# Patient Record
Sex: Female | Born: 1935 | Race: White | Hispanic: No | Smoking: Never smoker
Health system: Southern US, Community
[De-identification: ages and names within clinical notes are randomized; demographics above are authoritative.]

## PROBLEM LIST (undated history)

## (undated) DIAGNOSIS — M79606 Pain in leg, unspecified: Secondary | ICD-10-CM

## (undated) DIAGNOSIS — I2510 Atherosclerotic heart disease of native coronary artery without angina pectoris: Secondary | ICD-10-CM

## (undated) DIAGNOSIS — D649 Anemia, unspecified: Secondary | ICD-10-CM

## (undated) DIAGNOSIS — R002 Palpitations: Secondary | ICD-10-CM

## (undated) DIAGNOSIS — I10 Essential (primary) hypertension: Secondary | ICD-10-CM

## (undated) DIAGNOSIS — I739 Peripheral vascular disease, unspecified: Secondary | ICD-10-CM

## (undated) DIAGNOSIS — R0789 Other chest pain: Secondary | ICD-10-CM

## (undated) DIAGNOSIS — I779 Disorder of arteries and arterioles, unspecified: Secondary | ICD-10-CM

## (undated) HISTORY — DX: Peripheral vascular disease, unspecified: I73.9

## (undated) HISTORY — DX: Pain in leg, unspecified: M79.606

## (undated) HISTORY — DX: Disorder of arteries and arterioles, unspecified: I77.9

## (undated) HISTORY — DX: Palpitations: R00.2

## (undated) HISTORY — DX: Anemia, unspecified: D64.9

## (undated) HISTORY — DX: Other chest pain: R07.89

## (undated) HISTORY — PX: CAROTID ENDARTERECTOMY: SUR193

## (undated) HISTORY — DX: Essential (primary) hypertension: I10

---

## 1998-10-30 ENCOUNTER — Other Ambulatory Visit: Admission: RE | Admit: 1998-10-30 | Discharge: 1998-10-30 | Payer: Self-pay | Admitting: Gynecology

## 1999-11-04 ENCOUNTER — Other Ambulatory Visit: Admission: RE | Admit: 1999-11-04 | Discharge: 1999-11-04 | Payer: Self-pay | Admitting: Gynecology

## 2000-11-15 ENCOUNTER — Other Ambulatory Visit: Admission: RE | Admit: 2000-11-15 | Discharge: 2000-11-15 | Payer: Self-pay | Admitting: Gynecology

## 2001-11-16 ENCOUNTER — Other Ambulatory Visit: Admission: RE | Admit: 2001-11-16 | Discharge: 2001-11-16 | Payer: Self-pay | Admitting: Gynecology

## 2003-11-21 ENCOUNTER — Other Ambulatory Visit: Admission: RE | Admit: 2003-11-21 | Discharge: 2003-11-21 | Payer: Self-pay | Admitting: Gynecology

## 2004-06-16 ENCOUNTER — Ambulatory Visit (HOSPITAL_COMMUNITY): Admission: RE | Admit: 2004-06-16 | Discharge: 2004-06-16 | Payer: Self-pay | Admitting: Cardiology

## 2004-07-15 HISTORY — PX: CARDIAC CATHETERIZATION: SHX172

## 2004-12-30 ENCOUNTER — Ambulatory Visit: Payer: Self-pay | Admitting: Gastroenterology

## 2005-01-02 ENCOUNTER — Ambulatory Visit: Payer: Self-pay | Admitting: Gastroenterology

## 2005-01-02 ENCOUNTER — Encounter (INDEPENDENT_AMBULATORY_CARE_PROVIDER_SITE_OTHER): Payer: Self-pay | Admitting: Specialist

## 2005-01-27 ENCOUNTER — Ambulatory Visit: Payer: Self-pay | Admitting: Gastroenterology

## 2005-12-10 ENCOUNTER — Other Ambulatory Visit: Admission: RE | Admit: 2005-12-10 | Discharge: 2005-12-10 | Payer: Self-pay | Admitting: Gynecology

## 2007-05-23 ENCOUNTER — Ambulatory Visit: Payer: Self-pay | Admitting: Surgery

## 2007-12-07 ENCOUNTER — Ambulatory Visit: Payer: Self-pay | Admitting: Vascular Surgery

## 2008-04-05 ENCOUNTER — Encounter: Admission: RE | Admit: 2008-04-05 | Discharge: 2008-04-05 | Payer: Self-pay | Admitting: Orthopedic Surgery

## 2008-05-01 ENCOUNTER — Ambulatory Visit: Payer: Self-pay | Admitting: Vascular Surgery

## 2008-05-09 ENCOUNTER — Encounter: Admission: RE | Admit: 2008-05-09 | Discharge: 2008-05-09 | Payer: Self-pay | Admitting: Internal Medicine

## 2008-08-10 ENCOUNTER — Encounter: Admission: RE | Admit: 2008-08-10 | Discharge: 2008-08-10 | Payer: Self-pay | Admitting: Internal Medicine

## 2008-10-26 ENCOUNTER — Ambulatory Visit: Payer: Self-pay | Admitting: Vascular Surgery

## 2009-04-26 ENCOUNTER — Ambulatory Visit: Payer: Self-pay | Admitting: Vascular Surgery

## 2009-05-21 ENCOUNTER — Encounter: Admission: RE | Admit: 2009-05-21 | Discharge: 2009-05-21 | Payer: Self-pay | Admitting: Internal Medicine

## 2009-07-20 HISTORY — PX: EYE SURGERY: SHX253

## 2009-11-14 ENCOUNTER — Ambulatory Visit: Payer: Self-pay | Admitting: Vascular Surgery

## 2010-12-02 NOTE — Procedures (Signed)
CAROTID DUPLEX EXAM   INDICATION:  Follow up carotid artery disease.   HISTORY:  Diabetes:  No.  Cardiac:  CAD.  Hypertension:  Yes.  Smoking:  No.  Previous Surgery:  No.  CV History:  Amaurosis Fugax No, Paresthesias No, Hemiparesis No.                                       RIGHT             LEFT  Brachial systolic pressure:         132               136  Brachial Doppler waveforms:         Normal            Normal  Vertebral direction of flow:        Antegrade/resistive                 Antegrade  DUPLEX VELOCITIES (cm/sec)  CCA peak systolic                   100               78  ECA peak systolic                   99                342  ICA peak systolic                   306               221  ICA end diastolic                   60                60  PLAQUE MORPHOLOGY:                  Calcific          Calcific  PLAQUE AMOUNT:                      Moderate/severe   Moderate/severe  PLAQUE LOCATION:                    ICA               ICA/ECA   IMPRESSION:  1. High-end 60-79% stenosis of the right internal carotid artery.  2. 60-79% stenosis of the left internal carotid artery.  3. Mildly resistive Doppler waveform of the right vertebral artery      noted.  4. Progression of disease noted in the left internal carotid artery      with no change in the right internal carotid artery when compared      to the previous examination on 12/07/2007.   ___________________________________________  Larina Earthly, M.D.   CH/MEDQ  D:  05/01/2008  T:  05/01/2008  Job:  [id number]

## 2010-12-02 NOTE — Procedures (Signed)
CAROTID DUPLEX EXAM   INDICATION:  Follow-up evaluation of known carotid artery disease.   HISTORY:  Diabetes:  No.  Cardiac:  Coronary artery disease.  Hypertension:  Yes.  Smoking:  No.  Previous Surgery:  No.  CV History:  Patient reports no cerebrovascular symptoms at this time.  Previous duplex performed 05/23/07 revealed 60-79% right ICA stenosis  and a 60-79% left ICA stenosis.  Amaurosis Fugax No, Paresthesias No, Hemiparesis No                                       RIGHT             LEFT  Brachial systolic pressure:         130               136  Brachial Doppler waveforms:         Triphasic         Triphasic  Vertebral direction of flow:        Atypical antegrade                  Antegrade  DUPLEX VELOCITIES (cm/sec)  CCA peak systolic                   70                70  ECA peak systolic                   88                239  ICA peak systolic                   273               173  ICA end diastolic                   68                53  PLAQUE MORPHOLOGY:                  Calcified         Calcified  PLAQUE AMOUNT:                      Moderate-to-severe                  Moderate-to-severe  PLAQUE LOCATION:                    Mid-to-distal ICA Mid-to-distal ICA   IMPRESSION:  1. 60-79% right internal carotid artery stenosis.  2. 40-59% left internal carotid artery stenosis (high end of range).  3. Right vertebral artery waveforms suggest distal obstruction or      occlusion.   ___________________________________________  Larina Earthly, M.D.   MC/MEDQ  D:  12/07/2007  T:  12/07/2007  Job:  [id number]

## 2010-12-02 NOTE — Assessment & Plan Note (Signed)
OFFICE VISIT   MURPHY, BUNDICK  DOB:  March 23, 1927                                       11/14/2009  ZOXWR#:[id number]   The patient is a very pleasant 77 year old woman who we follow for  carotid occlusive disease.  She states that she has been currently  asymptomatic.  She denies amaurosis fugax or lateralizing paresthesias  or TIA like symptoms.   PAST MEDICAL HISTORY:  Consists of high blood pressure and dyslipidemia.   ALLERGIES:  None.   CURRENT MEDICATION:  1. Amlodipine 5 mg p.o. daily.  2. Isosorbide mononitrate 60 mg p.o. daily.  3. Metoprolol tartrate 100 mg p.o. b.i.d.  4. Ropinirole.  5. Hydrochlorothiazide 1 mg p.o. b.i.d.  6. Naprosyn over-the-counter.  7. Crestor 10 mg p.o. daily.  8. Vitamin D 2000 mg p.o. daily.  9. Aspirin 81 mg p.o. daily.   FAMILY HISTORY:  Significant for heart and vascular disease at a young  age in her father and brothers.   SOCIAL HISTORY:  She is widowed with two children.  She is retired.  She  does not currently smoke and has never smoked cigarettes or used  alcohol.   PHYSICAL FINDINGS:  Revealed a very pleasant elderly woman in no  apparent distress.  She was fairly well mobile.  Her heart rate was 62,  blood pressure 147/69 in her right arm, in her left arm it was 138/71.  She was well-nourished and in no distress.  HEENT:  NCAT.  PERRLA.  EOMI  intact.  Conjunctivae normal.  Mucous membranes were pink and moist.  The trachea was midline.  Lungs were clear to auscultation.  Cardiac  exam revealed a regular rate and rhythm.  I did appreciate a left  carotid bruit.  The abdomen was soft, nontender, nondistended.  Musculoskeletal system demonstrated no major deformities.  Neurological  exam was nonfocal.  I did not appreciate any rashes.   Laboratory work.  She did undergo duplex of her bilateral carotid  arteries today.  She did have bilateral internal carotid arteries  suggesting 60%-79% stenosis  on the high end of range.  There was a left  external carotid artery stenosis.  Antegrade flows were bilateral in the  vertebral arteries.  This is not significantly changed from 6 months  ago.   ASSESSMENT AND PLAN:  The patient continues to do well with her carotid  occlusive disease.  At this time, she is not having any symptoms which  would further incline [place] to suggest surgery.  We will continue to follow  her on a 6 months' basis.  Her hypertension and dyslipidemia remain  stable.   Wilmon Arms, PA   Janetta Hora. Fields, MD  Electronically Signed   KEL/MEDQ  D:  11/14/2009  T:  11/14/2009  Job:  [id number]

## 2010-12-02 NOTE — Procedures (Signed)
CAROTID DUPLEX EXAM   INDICATION:  Follow up carotid artery disease.   HISTORY:  Diabetes:  No.  Cardiac:  CAD.  Hypertension:  Yes.  Smoking:  No.  Previous Surgery:  No.  CV History:  No.  Amaurosis Fugax No, Paresthesias No, Hemiparesis No.                                       RIGHT             LEFT  Brachial systolic pressure:         178               154  Brachial Doppler waveforms:         WNL               WNL  Vertebral direction of flow:        Antegrade         Antegrade  DUPLEX VELOCITIES (cm/sec)  CCA peak systolic                   99                83  ECA peak systolic                   144               336  ICA peak systolic                   333               379  ICA end diastolic                   91                94  PLAQUE MORPHOLOGY:                  Heterogenous      Heterogenous  PLAQUE AMOUNT:                      Moderate-to-severe                  Moderate-to-severe  PLAQUE LOCATION:                    ICA, ECA          ICA, ECA   IMPRESSION:  1. Bilateral internal carotid arteries suggest 60% to 79% stenosis      (high end of range).  2. Left external carotid artery stenosis.  3. Antegrade flow in bilateral vertebrals.   ___________________________________________  Larina Earthly, M.D.   CB/MEDQ  D:  11/14/2009  T:  11/14/2009  Job:  [id number]

## 2010-12-02 NOTE — Procedures (Signed)
CAROTID DUPLEX EXAM   INDICATION:  Follow up known carotid artery disease.   HISTORY:  Diabetes:  No.  Cardiac:  Coronary artery disease.  Hypertension:  Yes.  Smoking:  No.  Previous Surgery:  CV History:  Amaurosis Fugax No, Paresthesias No, Hemiparesis No.                                       RIGHT             LEFT  Brachial systolic pressure:         130               132  Brachial Doppler waveforms:         Biphasic          Biphasic  Vertebral direction of flow:        Antegrade         Antegrade  DUPLEX VELOCITIES (cm/sec)  CCA peak systolic                   67                69  ECA peak systolic                   113               331  ICA peak systolic                   338               292  ICA end diastolic                   84                81  PLAQUE MORPHOLOGY:                  Heterogenous      Heterogenous  PLAQUE AMOUNT:                      Moderate to severe                  Moderate  PLAQUE LOCATION:                    ICA, ECA          ICA, ECA   IMPRESSION:  1. 60-79% stenosis noted in bilateral internal carotid arteries.  2. Antegrade bilateral vertebral arteries.       ___________________________________________  Larina Earthly, M.D.   MG/MEDQ  D:  10/26/2008  T:  10/26/2008  Job:  [id number]

## 2010-12-02 NOTE — Procedures (Signed)
CAROTID DUPLEX EXAM   INDICATION:  Followup carotid artery disease   HISTORY:  Diabetes:  No  Cardiac:  CAD  Hypertension:  Yes  Smoking:  No  Previous Surgery:  No  CV History:  No  Amaurosis Fugax No, Paresthesias No, Hemiparesis No                                       RIGHT             LEFT  Brachial systolic pressure:         130               138  Brachial Doppler waveforms:         Triphasic         Triphasic  Vertebral direction of flow:        Antegrade         Antegrade  DUPLEX VELOCITIES (cm/sec)  CCA peak systolic                   97                76  ECA peak systolic                   109               307  ICA peak systolic                   340               228  ICA end diastolic                   74                60  PLAQUE MORPHOLOGY:                  Calcified         Calcified  PLAQUE AMOUNT:                      Moderate          Moderate  PLAQUE LOCATION:                    ICA               ICA/ECA   IMPRESSION:  1. Evidence of bilateral ICA stenosis of 60% to 79% (the left side on      the lower end of range).  2. Left ECA stenosis.  3. Increased velocities from previous study, however, no category      change.   ___________________________________________  Larina Earthly, M.D.   AS/MEDQ  D:  05/23/2007  T:  05/24/2007  Job:  [id number]

## 2010-12-02 NOTE — Procedures (Signed)
CAROTID DUPLEX EXAM   INDICATION:  Followup known carotid artery disease.   HISTORY:  Diabetes:  No.  Cardiac:  Coronary artery disease.  Hypertension:  Yes.  Smoking:  No.  Previous Surgery:  No.  CV History:  No.  Amaurosis Fugax No, Paresthesias No, Hemiparesis No                                       RIGHT             LEFT  Brachial systolic pressure:         150               148  Brachial Doppler waveforms:         Triphasic         Triphasic  Vertebral direction of flow:        Antegrade         Antegrade  DUPLEX VELOCITIES (cm/sec)  CCA peak systolic                   114               90  ECA peak systolic                   98                410  ICA peak systolic                   378               316  ICA end diastolic                   69                66  PLAQUE MORPHOLOGY:                  Heterogeneous     Heterogeneous  PLAQUE AMOUNT:                      Moderate to severe                  Moderate  PLAQUE LOCATION:                    ICA/ECA           ICA/ECA   IMPRESSION:  1. 60%-79% stenosis in bilateral internal carotid arteries.  2. Antegrade flow in bilateral vertebrals.  3. Stenosis of the left external carotid artery.        ___________________________________________  Larina Earthly, M.D.   CB/MEDQ  D:  04/26/2009  T:  04/27/2009  Job:  [id number]

## 2010-12-29 ENCOUNTER — Ambulatory Visit
Admission: RE | Admit: 2010-12-29 | Discharge: 2010-12-29 | Disposition: A | Discharge status: Home / Self Care | Payer: Medicare Other | Source: Ambulatory Visit | Attending: Cardiovascular Disease | Admitting: Cardiovascular Disease

## 2010-12-29 ENCOUNTER — Other Ambulatory Visit: Payer: Self-pay | Admitting: Cardiovascular Disease

## 2010-12-29 DIAGNOSIS — R918 Other nonspecific abnormal finding of lung field: Secondary | ICD-10-CM

## 2011-01-20 ENCOUNTER — Other Ambulatory Visit: Payer: Self-pay

## 2011-01-20 ENCOUNTER — Ambulatory Visit: Payer: Self-pay | Admitting: Vascular Surgery

## 2011-02-25 ENCOUNTER — Encounter: Payer: Self-pay | Admitting: Vascular Surgery

## 2011-03-03 ENCOUNTER — Encounter: Payer: Self-pay | Admitting: Vascular Surgery

## 2011-03-03 ENCOUNTER — Other Ambulatory Visit (INDEPENDENT_AMBULATORY_CARE_PROVIDER_SITE_OTHER): Payer: Medicare Other

## 2011-03-03 ENCOUNTER — Ambulatory Visit (INDEPENDENT_AMBULATORY_CARE_PROVIDER_SITE_OTHER): Payer: Medicare Other | Admitting: Vascular Surgery

## 2011-03-03 VITALS — BP 175/78 | HR 69 | Temp 98.3°F

## 2011-03-03 DIAGNOSIS — I6529 Occlusion and stenosis of unspecified carotid artery: Secondary | ICD-10-CM

## 2011-03-03 NOTE — Progress Notes (Signed)
Subjective:     Patient ID: [name], female   DOB: 1927/04/07, 77 y.o.   MRN: [id number]  HPI The patient since today for followup of her known asymptomatic cerebrovascular occlusive disease. She denies any symptoms of amaurosis fugax, TIA, or stroke. She remains quite active for her age of 26 and has had no new cardiac difficulties.  Review of Systems  Constitutional: Negative.   HENT: Negative.   Eyes: Negative.   Respiratory: Negative.   Cardiovascular: Positive for leg swelling.  Gastrointestinal: Negative.   Genitourinary: Negative.   Musculoskeletal: Positive for back pain and arthralgias.  Neurological: Negative.   Hematological: Negative.   Psychiatric/Behavioral: Negative.    Past Medical History  Diagnosis Date  . High blood pressure   . Chest tightness   . Palpitations   . Anemia   . Leg pain     with walking  . Carotid arterial disease     asymptomatic     History  Substance Use Topics  . Smoking status: Never Smoker   . Smokeless tobacco: Not on file  . Alcohol Use: No    Family History  Problem Relation Age of Onset  . Lung disease Mother   . Heart disease Father   . Cancer Sister     liver cancer  . Heart disease Brother     No Known Allergies  Current outpatient prescriptions:amLODipine (NORVASC) 5 MG tablet, Take 5 mg by mouth daily.  , Disp: , Rfl: ;  aspirin 81 MG tablet, Take 81 mg by mouth daily.  , Disp: , Rfl: ;  Cholecalciferol (VITAMIN D PO), Take 2,000 mg by mouth daily.  , Disp: , Rfl: ;  isosorbide mononitrate (IMDUR) 60 MG 24 hr tablet, Take 60 mg by mouth daily.  , Disp: , Rfl:  metoprolol (TOPROL-XL) 100 MG 24 hr tablet, Take 100 mg by mouth 2 (two) times daily.  , Disp: , Rfl: ;  NAPROXEN PO, Take by mouth.  , Disp: , Rfl: ;  rOPINIRole (REQUIP) 1 MG tablet, Take 1 mg by mouth 2 (two) times daily.  , Disp: , Rfl: ;  rosuvastatin (CRESTOR) 10 MG tablet, Take 10 mg by mouth daily.  , Disp: , Rfl: ;  triamterene-hydrochlorothiazide  (MAXZIDE-25) 37.5-25 MG per tablet, Take 1 tablet by mouth daily.  , Disp: , Rfl:  ibandronate (BONIVA) 150 MG tablet, Take 150 mg by mouth every 30 (thirty) days. Take in the morning with a full glass of water, on an empty stomach, and do not take anything else by mouth or lie down for the next 30 min. , Disp: , Rfl:   There were no vitals filed for this visit.  There is no height or weight on file to calculate BMI.          Objective:   Physical Exam Well-developed well-nourished white female in no acute distress. No carotid bruits. 2+ radial pulses bilaterally. Heart regular rate and rhythm. Chest clear bilaterally. Neuro grossly intact.  Carotid duplex: Severe 80-99% bilateral internal carotid artery stenosis.    Assessment:     The patient has had significant progression from a duplex criteria of her bilateral carotid stenosis. I discussed the significance of this with the patient and have recommended that we proceed with a CT angiogram for further evaluation. If this confirms bilateral high-grade stenosis we would recommend staged bilateral carotid endarterectomies. I will discuss this further with the patient following her CT scan    Plan:  CT angiogram of carotid arteries an office visit

## 2011-03-04 ENCOUNTER — Other Ambulatory Visit: Payer: Self-pay | Admitting: Vascular Surgery

## 2011-03-04 DIAGNOSIS — I6529 Occlusion and stenosis of unspecified carotid artery: Secondary | ICD-10-CM

## 2011-03-13 NOTE — Procedures (Unsigned)
CAROTID DUPLEX EXAM  INDICATION:  Followup known carotid disease.  HISTORY: Diabetes:  No. Cardiac:  Yes. Hypertension:  Yes. Smoking:  No. Previous Surgery:  No. CV History: Amaurosis Fugax No, Paresthesias No, Hemiparesis No                                      RIGHT             LEFT Brachial systolic pressure:         140               144 Brachial Doppler waveforms:         WNL               WNL Vertebral direction of flow:        Antegrade         Antegrade DUPLEX VELOCITIES (cm/sec) CCA peak systolic                   99                75/0 ECA peak systolic                   93                78 ICA peak systolic                   458               439 ICA end diastolic                   113               161 PLAQUE MORPHOLOGY:                  Heterogeneous     Heterogeneous PLAQUE AMOUNT:                      Severe            Near occlusion PLAQUE LOCATION:                    ICA               ICA  IMPRESSION: 1. Technically difficult study due to disease severity and Doppler     limitations. 2. 80%-99% bilateral internal carotid artery stenosis.  Suspect near     occlusion in the left internal carotid artery; common carotid     artery waveforms are highly resistant and there is a Doppler bruit     in the internal carotid artery.  Velocities appear to exceed     machine limitations in the proximal segment.  A distal left     internal carotid artery waveform is extremely dampened. 3. Bilateral internal carotid artery is patent distal to the stenosis. 4. Bilateral vertebral artery is antegrade but abnormal.  ___________________________________________ Larina Earthly, M.D.  LT/MEDQ  D:  03/03/2011  T:  03/03/2011  Job:  [id number]

## 2011-03-17 ENCOUNTER — Ambulatory Visit (INDEPENDENT_AMBULATORY_CARE_PROVIDER_SITE_OTHER): Payer: Medicare Other | Admitting: Vascular Surgery

## 2011-03-17 ENCOUNTER — Ambulatory Visit
Admission: RE | Admit: 2011-03-17 | Discharge: 2011-03-17 | Disposition: A | Discharge status: Home / Self Care | Payer: Medicare Other | Source: Ambulatory Visit | Attending: Vascular Surgery | Admitting: Vascular Surgery

## 2011-03-17 ENCOUNTER — Encounter: Payer: Self-pay | Admitting: Vascular Surgery

## 2011-03-17 VITALS — BP 137/66 | HR 66 | Ht 63.0 in | Wt 149.0 lb

## 2011-03-17 DIAGNOSIS — I6529 Occlusion and stenosis of unspecified carotid artery: Secondary | ICD-10-CM

## 2011-03-17 MED ORDER — IOHEXOL 350 MG/ML SOLN
100.0000 mL | Freq: Once | INTRAVENOUS | Status: AC | PRN
  Administered 2011-03-17: 100 mL via INTRAVENOUS

## 2011-03-17 NOTE — Progress Notes (Signed)
Subjective:     Patient ID: [name], female   DOB: March 14, 1927, 77 y.o.   MRN: [id number]  HPI The patient presents today for followup of her CT angiogram of her neck. She had undergone carotid duplex showing significant stenosis in both internal carotid arteries on 03/03/2011. She remains asymptomatic. I reviewed her films and discussed them with the neuroradiologist. This does show high-grade greater than 80% stenosis bilaterally. Review of Systems No change from 03/03/2011    Past Medical History  Diagnosis Date  . High blood pressure   . Chest tightness   . Palpitations   . Anemia   . Leg pain     with walking  . Carotid arterial disease     asymptomatic     History  Substance Use Topics  . Smoking status: Never Smoker   . Smokeless tobacco: Not on file  . Alcohol Use: No    Family History  Problem Relation Age of Onset  . Lung disease Mother   . Heart disease Father   . Cancer Sister     liver cancer  . Heart disease Brother     No Known Allergies  Current outpatient prescriptions:amLODipine (NORVASC) 5 MG tablet, Take 5 mg by mouth daily.  , Disp: , Rfl: ;  aspirin 81 MG tablet, Take 81 mg by mouth daily.  , Disp: , Rfl: ;  Cholecalciferol (VITAMIN D PO), Take 2,000 mg by mouth daily.  , Disp: , Rfl:  ibandronate (BONIVA) 150 MG tablet, Take 150 mg by mouth every 30 (thirty) days. Take in the morning with a full glass of water, on an empty stomach, and do not take anything else by mouth or lie down for the next 30 min. , Disp: , Rfl: ;  isosorbide mononitrate (IMDUR) 60 MG 24 hr tablet, Take 60 mg by mouth daily.  , Disp: , Rfl: ;  metoprolol (TOPROL-XL) 100 MG 24 hr tablet, Take 100 mg by mouth 2 (two) times daily.  , Disp: , Rfl:  NAPROXEN PO, Take by mouth.  , Disp: , Rfl: ;  rOPINIRole (REQUIP) 1 MG tablet, Take 1 mg by mouth 2 (two) times daily.  , Disp: , Rfl: ;  rosuvastatin (CRESTOR) 10 MG tablet, Take 10 mg by mouth daily.  , Disp: , Rfl: ;   triamterene-hydrochlorothiazide (MAXZIDE-25) 37.5-25 MG per tablet, Take 1 tablet by mouth daily.  , Disp: , Rfl:  No current facility-administered medications for this visit. Facility-Administered Medications Ordered in Other Visits: iohexol (OMNIPAQUE) 350 MG/ML injection 100 mL, 100 mL, Intravenous, Once PRN, Medication Radiologist, 100 mL at 03/17/11 1222  BP 137/66  Pulse 66  Ht 5\' 3"  (1.6 m)  Wt 149 lb (67.586 kg)  BMI 26.39 kg/m2  SpO2 99%  Body mass index is 26.39 kg/(m^2).       Objective:   Physical Exam Well-developed well-nourished white female. Heart regular rate and rhythm chest clear bilaterally. 2+ radial pulses bilaterally. bilateral carotid bruits.    Assessment:     Bilateral high-grade carotid stenoses, asymptomatic    Plan:     Staged bilateral carotid endarterectomies. The patient understands the procedure and potential risks including 1-2% risk for stroke. I recommended left carotid endarterectomy in this is scheduled for September 10.

## 2011-03-25 ENCOUNTER — Ambulatory Visit (HOSPITAL_COMMUNITY)
Admission: RE | Admit: 2011-03-25 | Discharge: 2011-03-25 | Disposition: A | Discharge status: Home / Self Care | Payer: Medicare Other | Source: Ambulatory Visit | Attending: Vascular Surgery | Admitting: Vascular Surgery

## 2011-03-25 ENCOUNTER — Other Ambulatory Visit: Payer: Self-pay | Admitting: Vascular Surgery

## 2011-03-25 ENCOUNTER — Encounter (HOSPITAL_COMMUNITY)
Admission: RE | Admit: 2011-03-25 | Discharge: 2011-03-25 | Disposition: A | Discharge status: Home / Self Care | Payer: Medicare Other | Source: Ambulatory Visit | Attending: Vascular Surgery | Admitting: Vascular Surgery

## 2011-03-25 DIAGNOSIS — Z01818 Encounter for other preprocedural examination: Secondary | ICD-10-CM | POA: Insufficient documentation

## 2011-03-25 DIAGNOSIS — R05 Cough: Secondary | ICD-10-CM | POA: Insufficient documentation

## 2011-03-25 DIAGNOSIS — I6522 Occlusion and stenosis of left carotid artery: Secondary | ICD-10-CM

## 2011-03-25 DIAGNOSIS — Z01810 Encounter for preprocedural cardiovascular examination: Secondary | ICD-10-CM | POA: Insufficient documentation

## 2011-03-25 DIAGNOSIS — I6529 Occlusion and stenosis of unspecified carotid artery: Secondary | ICD-10-CM | POA: Insufficient documentation

## 2011-03-25 DIAGNOSIS — Z01812 Encounter for preprocedural laboratory examination: Secondary | ICD-10-CM | POA: Insufficient documentation

## 2011-03-25 DIAGNOSIS — R059 Cough, unspecified: Secondary | ICD-10-CM | POA: Insufficient documentation

## 2011-03-25 DIAGNOSIS — I10 Essential (primary) hypertension: Secondary | ICD-10-CM | POA: Insufficient documentation

## 2011-03-25 LAB — COMPREHENSIVE METABOLIC PANEL
ALT: 12 U/L (ref 0–35)
AST: 14 U/L (ref 0–37)
Albumin: 4 g/dL (ref 3.5–5.2)
Alkaline Phosphatase: 101 U/L (ref 39–117)
BUN: 15 mg/dL (ref 6–23)
CO2: 28 mEq/L (ref 19–32)
Calcium: 9.5 mg/dL (ref 8.4–10.5)
Chloride: 99 mEq/L (ref 96–112)
Creatinine, Ser: 0.78 mg/dL (ref 0.50–1.10)
GFR calc Af Amer: >60 mL/min (ref >60)
GFR calc non Af Amer: >60 mL/min (ref >60)
Glucose, Bld: 106 mg/dL — ABNORMAL HIGH (ref 70–99)
Potassium: 3.6 mEq/L (ref 3.5–5.1)
Sodium: 137 mEq/L (ref 135–145)
Total Bilirubin: 0.4 mg/dL (ref 0.3–1.2)
Total Protein: 6.8 g/dL (ref 6.0–8.3)

## 2011-03-25 LAB — URINE MICROSCOPIC-ADD ON

## 2011-03-25 LAB — URINALYSIS, ROUTINE W REFLEX MICROSCOPIC
Bilirubin Urine: NEGATIVE
Glucose, UA: NEGATIVE mg/dL
Ketones, ur: NEGATIVE mg/dL
Nitrite: NEGATIVE
Protein, ur: NEGATIVE mg/dL
Specific Gravity, Urine: 1.019 (ref 1.005–1.030)
Urobilinogen, UA: 0.2 mg/dL (ref 0.0–1.0)
pH: 5.5 (ref 5.0–8.0)

## 2011-03-25 LAB — SURGICAL PCR SCREEN
MRSA, PCR: NEGATIVE
Staphylococcus aureus: NEGATIVE

## 2011-03-25 LAB — PROTIME-INR
INR: 0.93 (ref 0.00–1.49)
Prothrombin Time: 12.7 seconds (ref 11.6–15.2)

## 2011-03-25 LAB — CBC
HCT: 40.3 % (ref 36.0–46.0)
Hemoglobin: 13.8 g/dL (ref 12.0–15.0)
MCH: 30.6 pg (ref 26.0–34.0)
MCHC: 34.2 g/dL (ref 30.0–36.0)
MCV: 89.4 fL (ref 78.0–100.0)
Platelets: 197 10*3/uL (ref 150–400)
RBC: 4.51 MIL/uL (ref 3.87–5.11)
RDW: 13 % (ref 11.5–15.5)
WBC: 12 10*3/uL — ABNORMAL HIGH (ref 4.0–10.5)

## 2011-03-25 LAB — APTT: aPTT: 30 seconds (ref 24–37)

## 2011-03-30 ENCOUNTER — Inpatient Hospital Stay (HOSPITAL_COMMUNITY)
Admission: RE | Admit: 2011-03-30 | Discharge: 2011-03-31 | DRG: 039 | Disposition: A | Discharge status: Home / Self Care | Payer: Medicare Other | Source: Ambulatory Visit | Attending: Vascular Surgery | Admitting: Vascular Surgery

## 2011-03-30 ENCOUNTER — Other Ambulatory Visit: Payer: Self-pay | Admitting: Vascular Surgery

## 2011-03-30 DIAGNOSIS — Z7982 Long term (current) use of aspirin: Secondary | ICD-10-CM

## 2011-03-30 DIAGNOSIS — I10 Essential (primary) hypertension: Secondary | ICD-10-CM | POA: Diagnosis present

## 2011-03-30 DIAGNOSIS — I6529 Occlusion and stenosis of unspecified carotid artery: Principal | ICD-10-CM | POA: Diagnosis present

## 2011-03-30 DIAGNOSIS — E785 Hyperlipidemia, unspecified: Secondary | ICD-10-CM | POA: Diagnosis present

## 2011-03-30 DIAGNOSIS — Z79899 Other long term (current) drug therapy: Secondary | ICD-10-CM

## 2011-03-30 DIAGNOSIS — I658 Occlusion and stenosis of other precerebral arteries: Secondary | ICD-10-CM | POA: Diagnosis present

## 2011-03-30 DIAGNOSIS — Z23 Encounter for immunization: Secondary | ICD-10-CM

## 2011-03-30 LAB — TYPE AND SCREEN
ABO/RH(D): AB POS
Antibody Screen: POSITIVE

## 2011-03-31 LAB — BASIC METABOLIC PANEL
BUN: 9 mg/dL (ref 6–23)
CO2: 28 mEq/L (ref 19–32)
Calcium: 9.2 mg/dL (ref 8.4–10.5)
Chloride: 102 mEq/L (ref 96–112)
Creatinine, Ser: 0.66 mg/dL (ref 0.50–1.10)
GFR calc Af Amer: >60 mL/min (ref >60)
GFR calc non Af Amer: >60 mL/min (ref >60)
Glucose, Bld: 129 mg/dL — ABNORMAL HIGH (ref 70–99)
Potassium: 3.4 mEq/L — ABNORMAL LOW (ref 3.5–5.1)
Sodium: 136 mEq/L (ref 135–145)

## 2011-03-31 LAB — CBC
HCT: 32.7 % — ABNORMAL LOW (ref 36.0–46.0)
Hemoglobin: 10.8 g/dL — ABNORMAL LOW (ref 12.0–15.0)
MCH: 29.6 pg (ref 26.0–34.0)
MCHC: 33 g/dL (ref 30.0–36.0)
MCV: 89.6 fL (ref 78.0–100.0)
Platelets: 204 10*3/uL (ref 150–400)
RBC: 3.65 MIL/uL — ABNORMAL LOW (ref 3.87–5.11)
RDW: 12.9 % (ref 11.5–15.5)
WBC: 8.8 10*3/uL (ref 4.0–10.5)

## 2011-04-01 LAB — TYPE AND SCREEN
ABO/RH(D): AB POS
Antibody Screen: POSITIVE
DAT, IgG: NEGATIVE
Unit division: 0
Unit division: 0

## 2011-04-07 NOTE — Op Note (Signed)
[name], [name]            ACCOUNT NO.:  [id number]  MEDICAL RECORD NO.:  [id number]  LOCATION:  2550                         FACILITY:  [facility]  PHYSICIAN:  Larina Earthly, M.D.    DATE OF BIRTH:  04-Nov-1926  DATE OF PROCEDURE:  03/30/2011 DATE OF DISCHARGE:                              OPERATIVE REPORT   PREOPERATIVE DIAGNOSIS:  Severe bilateral internal carotid artery stenosis, asymptomatic.  POSTOPERATIVE DIAGNOSIS:  Severe bilateral internal carotid artery stenosis, asymptomatic.  PROCEDURE:  Left carotid endarterectomy Dacron patch angioplasty.  SURGEON:  Larina Earthly, MD  ASSISTANT:  Newton Pigg, PA-C  ANESTHESIA:  General endotracheal.  COMPLICATIONS:  None.  DISPOSITION:  To recovery room neurologically intact.  PROCEDURE IN DETAIL:  The patient was taken to the operating room, placed in a supine position where the left neck was prepped and draped in usual sterile fashion.  Incision was made in the anterior sternocleidomastoid and carried down through the platysma with electrocautery.  Sternocleidomastoid reflected posteriorly and the carotid sheath was opened.  Facial vein was ligated with 2-0 silk ties and divided.  The common carotid artery was encircled with umbilical tape and Rumel tourniquet.  The vagus nerve was identified and preserved.  Dissection was carried onto the bifurcation.  The patient had somewhat unusual external carotid anatomy with 2 large branches and no superior thyroid artery.  These were both individually controlled with red vessel loop Potts ties.  The internal carotid artery was exposed further distally and the plaque was focal at the bifurcation. The internal carotid artery was encircled with an umbilical tape and Rumel tourniquet.  The patient was given 7 ounces of intravenous heparin.  After adequate circulation time, the internal, external, common carotid arteries were occluded.  Common carotid artery was opened with  11-blade, sewn with Potts scissors through the plaque onto the internal carotid with Potts scissors.  A 10-shunt was passed up the internal carotid artery, allowed to back bleed and down the common carotid where it was secured with Rumel tourniquets.  Endarterectomy was begun on the common carotid artery and the plaque was divided proximally with Potts scissors.  The endarterectomy was carried onto bifurcation. The external carotid was endarterectomized with eversion technique and the internal carotid endarterectomized in open fashion.  Remaining atheromatous debris was removed from the endarterectomy plane.  A Finesse Hemashield Dacron patch was brought onto the field and sewn as a patch angioplasty with a running 6-0 Prolene suture.  Prior to completion of the anastomosis, the shunt was removed and the usual flush maneuvers were undertaken.  The anastomosis was completed and flow was restored first to the external then the internal carotid artery. Excellent flow characteristics were noted with handheld Doppler in the internal and external carotid artery.  The patient was given 50 mg of protamine to reverse heparin.  The wounds were irrigated with saline. Hemostasis with electrocautery.  Wounds were closed with 3-0 Vicryl and reapproximated the sternocleidomastoid over the carotid sheath.  Next, the platysma muscle was closed with a running 3-0 Vicryl sutures and finally the skin was closed with 4-0 subcuticular Vicryl stitch.  Benzoin and Steri- Strips were applied.  Sterile dressing was applied.  The  patient was awakened neurologically intact in the operating room and was transferred to the recovery room in stable condition.     Larina Earthly, M.D.     TFE/MEDQ  D:  03/30/2011  T:  03/30/2011  Job:  [id number]  Electronically Signed by Skyann Ganim M.D. on 04/07/2011 01:12:00 PM

## 2011-04-07 NOTE — Discharge Summary (Signed)
  NAMEANNABELLA, [name] NO.:  [id number]  MEDICAL RECORD NO.:  [id number]  LOCATION:  3316                         FACILITY:  [facility]  PHYSICIAN:  Larina Earthly, M.D.    DATE OF BIRTH:  1926-09-29  DATE OF ADMISSION:  03/30/2011 DATE OF DISCHARGE:  03/31/2011                              DISCHARGE SUMMARY   ADMISSION DIAGNOSIS:  Carotid occlusive disease.  HISTORY OF PRESENT ILLNESS:  This is an 77 year old white female who has been followed chronically for her carotid occlusive disease.  She was asymptomatic, however, she has had advancement of her stenosis to 80-99% bilateral internal carotid artery stenosis.  It has been decided that we will proceed with surgery.  HOSPITAL COURSE:  She is admitted to the hospital, taken to the operating room on March 29, 2010, where she underwent a left carotid artery endarterectomy with Dacron patch angioplasty.  She tolerated procedure well and was transported to the recovery room in satisfactory condition.  By postoperative day #1, her neuro exam is intact and she is doing well.  Her incision is clean, dry and intact and no hematomas present.  Otherwise, her postoperative course include increasing ambulation as well as increasing intake of solids without difficulty.  DISCHARGE INSTRUCTIONS:  She is discharged home with extensive instructions on wound care and progressive ambulation.  She is instructed not to drive or perform any heavy lifting for 1 month.  DISCHARGE DIAGNOSIS: 1. Bilateral internal carotid artery stenosis.     a.     Status post left carotid endarterectomy, March 30, 2011. 2. Hypertension. 3. Dyslipidemia.  DISCHARGE MEDICATIONS: 1. Oxycodone 5 mg one p.o. q.4-6 h p.r.n. pain, #30, no refill. 2. Amlodipine 5 mg 1-1/2 tablets p.o. daily. 3. Aspirin 81 mg p.o. daily. 4. Calcium OTC 1 p.o. daily. 5. Crestor 10 mg p.o. nightly. 6. Imdur 60 mg p.o. daily. 7. Metoprolol 100 mg p.o. b.i.d. 8.  Ropinirole 1 mg p.o. t.i.d. 9. Vitamin D3 over-the-counter 1 p.o. daily.  FOLLOWUP:  The patient is to follow up in 2 weeks for followup as well as preop for her right carotid endarterectomy.     Newton Pigg, PA   ______________________________ Larina Earthly, M.D.    SE/MEDQ  D:  03/31/2011  T:  03/31/2011  Job:  [id number]  Electronically Signed by Newton Pigg PA on 04/02/2011 12:48:53 PM Electronically Signed by Tawanna Cooler Keaundre Thelin M.D. on 04/07/2011 01:12:03 PM

## 2011-04-13 ENCOUNTER — Encounter: Payer: Self-pay | Admitting: Vascular Surgery

## 2011-04-14 ENCOUNTER — Ambulatory Visit (INDEPENDENT_AMBULATORY_CARE_PROVIDER_SITE_OTHER): Payer: Medicare Other | Admitting: Vascular Surgery

## 2011-04-14 ENCOUNTER — Encounter: Payer: Self-pay | Admitting: Vascular Surgery

## 2011-04-14 VITALS — BP 161/73 | HR 61 | Resp 20 | Ht 62.0 in | Wt 151.0 lb

## 2011-04-14 DIAGNOSIS — I6529 Occlusion and stenosis of unspecified carotid artery: Secondary | ICD-10-CM

## 2011-04-14 NOTE — Progress Notes (Signed)
The patient presents today for followup of her left carotid endarterectomy for high-grade asymptomatic carotid stenosis. Surgery was on 03/30/2011. She did well and was discharged home on postoperative day 1. He has had minimal discomfort and is returned to her baseline activities.  Physical exam: Well healed left carotid incision with no bruit present. She does have a right carotid bruit. She is grossly intact neurologically.  Impression and plan: Stable status post left carotid endarterectomy. She wishes to proceed with the right endarterectomy for high-grade asymptomatic stenosis. We have scheduled the surgery for October 17 at her convenience.[name]

## 2011-05-05 ENCOUNTER — Encounter (HOSPITAL_COMMUNITY)
Admission: RE | Admit: 2011-05-05 | Discharge: 2011-05-05 | Disposition: A | Discharge status: Home / Self Care | Payer: Medicare Other | Source: Ambulatory Visit | Attending: Vascular Surgery | Admitting: Vascular Surgery

## 2011-05-05 ENCOUNTER — Ambulatory Visit (HOSPITAL_COMMUNITY)
Admission: RE | Admit: 2011-05-05 | Discharge: 2011-05-05 | Disposition: A | Discharge status: Home / Self Care | Payer: Medicare Other | Source: Ambulatory Visit | Attending: Vascular Surgery | Admitting: Vascular Surgery

## 2011-05-05 ENCOUNTER — Other Ambulatory Visit: Payer: Self-pay | Admitting: Vascular Surgery

## 2011-05-05 DIAGNOSIS — R52 Pain, unspecified: Secondary | ICD-10-CM

## 2011-05-05 DIAGNOSIS — Z01818 Encounter for other preprocedural examination: Secondary | ICD-10-CM | POA: Insufficient documentation

## 2011-05-05 DIAGNOSIS — I440 Atrioventricular block, first degree: Secondary | ICD-10-CM | POA: Insufficient documentation

## 2011-05-05 DIAGNOSIS — Z01810 Encounter for preprocedural cardiovascular examination: Secondary | ICD-10-CM | POA: Insufficient documentation

## 2011-05-05 DIAGNOSIS — Z01812 Encounter for preprocedural laboratory examination: Secondary | ICD-10-CM | POA: Insufficient documentation

## 2011-05-05 LAB — SURGICAL PCR SCREEN
MRSA, PCR: NEGATIVE
Staphylococcus aureus: NEGATIVE

## 2011-05-05 LAB — COMPREHENSIVE METABOLIC PANEL
ALT: 13 U/L (ref 0–35)
AST: 16 U/L (ref 0–37)
Albumin: 3.8 g/dL (ref 3.5–5.2)
Alkaline Phosphatase: 94 U/L (ref 39–117)
BUN: 20 mg/dL (ref 6–23)
CO2: 31 mEq/L (ref 19–32)
Calcium: 9.9 mg/dL (ref 8.4–10.5)
Chloride: 103 mEq/L (ref 96–112)
Creatinine, Ser: 0.88 mg/dL (ref 0.50–1.10)
GFR calc Af Amer: 69 mL/min — ABNORMAL LOW (ref >90)
GFR calc non Af Amer: 60 mL/min — ABNORMAL LOW (ref >90)
Glucose, Bld: 97 mg/dL (ref 70–99)
Potassium: 3.8 mEq/L (ref 3.5–5.1)
Sodium: 141 mEq/L (ref 135–145)
Total Bilirubin: 0.4 mg/dL (ref 0.3–1.2)
Total Protein: 6.8 g/dL (ref 6.0–8.3)

## 2011-05-05 LAB — CBC
HCT: 37.9 % (ref 36.0–46.0)
Hemoglobin: 12.6 g/dL (ref 12.0–15.0)
MCH: 29.9 pg (ref 26.0–34.0)
MCHC: 33.2 g/dL (ref 30.0–36.0)
MCV: 90 fL (ref 78.0–100.0)
Platelets: 183 10*3/uL (ref 150–400)
RBC: 4.21 MIL/uL (ref 3.87–5.11)
RDW: 13.4 % (ref 11.5–15.5)
WBC: 7.3 10*3/uL (ref 4.0–10.5)

## 2011-05-05 LAB — URINALYSIS, ROUTINE W REFLEX MICROSCOPIC
Bilirubin Urine: NEGATIVE
Glucose, UA: NEGATIVE mg/dL
Ketones, ur: NEGATIVE mg/dL
Nitrite: NEGATIVE
Protein, ur: NEGATIVE mg/dL
Specific Gravity, Urine: 1.018 (ref 1.005–1.030)
Urobilinogen, UA: 0.2 mg/dL (ref 0.0–1.0)
pH: 6 (ref 5.0–8.0)

## 2011-05-05 LAB — URINE MICROSCOPIC-ADD ON

## 2011-05-05 LAB — PROTIME-INR
INR: 0.96 (ref 0.00–1.49)
Prothrombin Time: 13 seconds (ref 11.6–15.2)

## 2011-05-05 LAB — APTT: aPTT: 28 seconds (ref 24–37)

## 2011-05-11 ENCOUNTER — Inpatient Hospital Stay (HOSPITAL_COMMUNITY)
Admission: RE | Admit: 2011-05-11 | Discharge: 2011-05-12 | DRG: 039 | Disposition: A | Discharge status: Home / Self Care | Payer: Medicare Other | Source: Ambulatory Visit | Attending: Vascular Surgery | Admitting: Vascular Surgery

## 2011-05-11 ENCOUNTER — Other Ambulatory Visit: Payer: Self-pay | Admitting: Vascular Surgery

## 2011-05-11 DIAGNOSIS — I2510 Atherosclerotic heart disease of native coronary artery without angina pectoris: Secondary | ICD-10-CM | POA: Diagnosis present

## 2011-05-11 DIAGNOSIS — I6529 Occlusion and stenosis of unspecified carotid artery: Principal | ICD-10-CM | POA: Diagnosis present

## 2011-05-11 DIAGNOSIS — I658 Occlusion and stenosis of other precerebral arteries: Secondary | ICD-10-CM | POA: Diagnosis present

## 2011-05-12 LAB — CBC
HCT: 34.3 % — ABNORMAL LOW (ref 36.0–46.0)
Hemoglobin: 11.3 g/dL — ABNORMAL LOW (ref 12.0–15.0)
MCH: 29.4 pg (ref 26.0–34.0)
MCHC: 32.9 g/dL (ref 30.0–36.0)
MCV: 89.1 fL (ref 78.0–100.0)
Platelets: 166 10*3/uL (ref 150–400)
RBC: 3.85 MIL/uL — ABNORMAL LOW (ref 3.87–5.11)
RDW: 13.6 % (ref 11.5–15.5)
WBC: 8.4 10*3/uL (ref 4.0–10.5)

## 2011-05-12 LAB — BASIC METABOLIC PANEL
BUN: 10 mg/dL (ref 6–23)
CO2: 26 mEq/L (ref 19–32)
Calcium: 9 mg/dL (ref 8.4–10.5)
Chloride: 104 mEq/L (ref 96–112)
Creatinine, Ser: 0.63 mg/dL (ref 0.50–1.10)
GFR calc Af Amer: >90 mL/min (ref >90)
GFR calc non Af Amer: 81 mL/min — ABNORMAL LOW (ref >90)
Glucose, Bld: 113 mg/dL — ABNORMAL HIGH (ref 70–99)
Potassium: 3.8 mEq/L (ref 3.5–5.1)
Sodium: 139 mEq/L (ref 135–145)

## 2011-05-13 LAB — TYPE AND SCREEN
ABO/RH(D): AB POS
Antibody Screen: POSITIVE
Unit division: 0
Unit division: 0

## 2011-05-15 NOTE — Discharge Summary (Addendum)
  [name], [name] NO.:  [id number]  MEDICAL RECORD NO.:  [id number]  LOCATION:  3315                         FACILITY:  [facility]  PHYSICIAN:  Larina Earthly, M.D.    DATE OF BIRTH:  09-Dec-1926  DATE OF ADMISSION:  05/11/2011 DATE OF DISCHARGE:  05/12/2011                              DISCHARGE SUMMARY   HISTORY OF PRESENT ILLNESS:  [name] is an 77 year old woman with known bilateral carotid stenosis, which was asymptomatic.  She had had a left carotid endarterectomy 4 months ago and did well, and she was being readmitted for the right carotid endarterectomy.  PAST MEDICAL HISTORY/REVIEW OF SYSTEMS:  Same as her previous admission. She has a history of high blood pressure, palpitations, anemia, claudication, and asymptomatic carotid disease.  She also has a history of coronary artery disease.  HOSPITAL COURSE:  The patient was taken to the operating room on May 11, 2011 for a right carotid endarterectomy with Dacron patch angioplasty.  Postoperatively, the patient did well.  She had no tongue deviation.  No facial droop.  She had good and equal strength, bilateral upper and lower extremities.  She is alert and oriented x3.  She had no headache and no difficulty swallowing.  Her right neck wound was healing well without hematoma.  She did have some mild nausea, which resolved after eating breakfast.  She was ambulating, voiding, and taking p.o. and she was discharged to home on May 12, 2011.  She will follow up with Dr. Arbie Cookey in 2 weeks.  FINAL DIAGNOSIS:  Bilateral carotid stenosis, status post left carotid endarterectomy on previous admission and status post right carotid endarterectomy on this admission.  All of her other medical issues were stable while in-house on her previous medication regimen.  DISPOSITION:  The patient was discharged to home.  She will follow up with Dr. Arbie Cookey in 2 weeks.  She will also have serial ultrasounds  for carotids per our protocol.  DISCHARGE MEDICATIONS: 1. Amlodipine 7.5 mg daily. 2. Aspirin 81 mg daily. 3. Calcium 1 tablet daily over-the-counter. 4. Crestor 10 mg daily at bedtime. 5. Isosorbide mononitrate 60 mg daily at bedtime. 6. Metoprolol 100 mg twice daily. 7. Oxycodone 5 mg 1 tablet every 4 hours as needed for pain.  She has     a prescription for this at home.  No new     prescription was given. 8. Ropinirole 1 mg at noon, 4 p.m., and at bedtime. 9. Vitamin D over-the-counter.     Della Goo, PA-C   ______________________________ Larina Earthly, M.D.    RR/MEDQ  D:  05/12/2011  T:  05/13/2011  Job:  [id number]  Electronically Signed by Della Goo PA on 05/15/2011 09:32:20 AM Electronically Signed by Kesia Dalto M.D. on 05/20/2011 01:17:25 PM

## 2011-05-20 NOTE — Op Note (Signed)
[name], [name]            ACCOUNT NO.:  [id number]  MEDICAL RECORD NO.:  [id number]  LOCATION:  3315                         FACILITY:  [facility]  PHYSICIAN:  Larina Earthly, M.D.    DATE OF BIRTH:  [birth year]  DATE OF PROCEDURE:  05/11/2011 DATE OF DISCHARGE:                              OPERATIVE REPORT   PREOPERATIVE DIAGNOSIS:  Severe asymptomatic right internal carotid artery stenosis.  POSTOPERATIVE DIAGNOSIS:  Severe asymptomatic right internal carotid artery stenosis.  PROCEDURE:  Right carotid endarterectomy and Dacron patch angioplasty.  SURGEON:  Larina Earthly, MD  ASSISTANT:  Newton Pigg, PA-C  ANESTHESIA:  General endotracheal.  COMPLICATIONS:  None.  DISPOSITION:  To recovery room, neurologically intact.  INDICATION FOR PROCEDURE:  The patient is an 77 year old white female who had a long history of moderate stenosis and internal carotid artery stenosis, progressed severe bilateral stenosis.  She underwent uneventful left carotid endarterectomy several weeks ago and is here today for her staged right carotid endarterectomy.  PROCEDURE IN DETAIL:  The patient was taken to the operating room and placed in supine position where the right neck was prepped and draped in usual sterile fashion.  Incision was made at anterior sternocleidomastoid and carried down through the platysma with electrocautery.  Sternocleidomastoid reflected posteriorly and the carotid sheath was opened.  Facial vein was ligated with 2-0 silk ties and divided.  The vagus and hypoglossal nerves were identified and preserved.  The common carotid artery was encircled with an umbilical tape and Rumel tourniquet.  Dissection was taken down to the bifurcation.  The superior thyroid artery was encircled with 2-0 silk Potts tie and the external carotid was encircled with a blue vessel loop.  The internal carotid was encircled with an umbilical tape and Rumel tourniquet.  The patient  was given 7000 units of intravenous heparin.  After adequate circulation time, the internal, external, and common carotid arteries were occluded.  The common carotid artery was opened with 11 blade and extended longitudinally with Potts scissors through the plaque onto the internal carotid artery.  A 10 shunt was passed up the internal carotid, allow to back bleed and down the common carotid artery were secured with Rumel tourniquets.  The endarterectomy was begun on the common carotid artery and the plaque was divided proximally with Potts scissors.  The endarterectomy was carried onto the bifurcation.  The external carotid was endarterectomized with eversion technique and the internal carotid was endarterectomized in open fashion.  Remaining atheromatous debris was removed from endarterectomy plane.  A Finesse Hemashield Dacron patch was brought onto the field and sewn as a patch angioplasty with a running 6-0 Prolene suture.  Prior to completion of the anastomosis, the shunt was removed and the usual flushing maneuvers were undertaken.  The anastomosis was completed and flow was restored first to the internal, then external carotid artery. Excellent flow characteristics were noted with handheld Doppler in the internal and external carotid artery.  The patient was given 50 mg of protamine to reverse the heparin.  Wounds were irrigated with saline. Hemostasis with electrocautery.  Wounds were closed with several 3-0 Vicryl sutures.  Reapproximated sternocleidomastoid over the carotid sheath.  Next, the platysma  was closed with running 3-0 Vicryl suture and finally the skin was closed with a 4-0 subcuticular Vicryl stitch. Sterile dressing was applied.  The patient was awakened, neurologically intact in the operating room, and transferred to the recovery room in stable condition.     Larina Earthly, M.D.     TFE/MEDQ  D:  05/11/2011  T:  05/11/2011  Job:  [id number]  cc:   Gerlene Burdock A.  Alanda Amass, M.D. Soyla Murphy. Renne Crigler, M.D.  Electronically Signed by Tawanna Cooler Jacquie Lukes M.D. on 05/20/2011 01:17:27 PM

## 2011-05-25 ENCOUNTER — Encounter: Payer: Self-pay | Admitting: Vascular Surgery

## 2011-05-26 ENCOUNTER — Encounter: Payer: Self-pay | Admitting: Vascular Surgery

## 2011-05-26 ENCOUNTER — Ambulatory Visit (INDEPENDENT_AMBULATORY_CARE_PROVIDER_SITE_OTHER): Payer: Medicare Other | Admitting: Vascular Surgery

## 2011-05-26 VITALS — BP 153/51 | HR 67 | Resp 18 | Ht 62.0 in | Wt 153.0 lb

## 2011-05-26 DIAGNOSIS — I6529 Occlusion and stenosis of unspecified carotid artery: Secondary | ICD-10-CM

## 2011-05-26 NOTE — Progress Notes (Signed)
The patient presents today for followup of her staged bilateral carotid endarterectomies. She is 2 weeks status post right carotid endarterectomy. She's had no complications. She has minimal soreness. Her left neck is completely healed with no evidence of applications a right incision looks quite good the day. Her Steri-Strips were removed. She is grossly intact neurologically.  Impression and plan: Stable status post staged bilateral carotid endarterectomies. The patient will be seen again in 6 months with repeat carotid duplex. She will notify should she develop any difficulties in the interim.

## 2011-08-04 DIAGNOSIS — M25579 Pain in unspecified ankle and joints of unspecified foot: Secondary | ICD-10-CM | POA: Diagnosis not present

## 2011-08-04 DIAGNOSIS — M19079 Primary osteoarthritis, unspecified ankle and foot: Secondary | ICD-10-CM | POA: Diagnosis not present

## 2011-09-01 DIAGNOSIS — M779 Enthesopathy, unspecified: Secondary | ICD-10-CM | POA: Diagnosis not present

## 2011-09-01 DIAGNOSIS — I701 Atherosclerosis of renal artery: Secondary | ICD-10-CM | POA: Diagnosis not present

## 2011-09-01 DIAGNOSIS — I10 Essential (primary) hypertension: Secondary | ICD-10-CM | POA: Diagnosis not present

## 2011-09-09 DIAGNOSIS — H52209 Unspecified astigmatism, unspecified eye: Secondary | ICD-10-CM | POA: Diagnosis not present

## 2011-09-09 DIAGNOSIS — Z961 Presence of intraocular lens: Secondary | ICD-10-CM | POA: Diagnosis not present

## 2011-11-18 ENCOUNTER — Other Ambulatory Visit: Payer: Self-pay | Admitting: *Deleted

## 2011-11-18 DIAGNOSIS — I6529 Occlusion and stenosis of unspecified carotid artery: Secondary | ICD-10-CM

## 2011-11-18 DIAGNOSIS — Z48812 Encounter for surgical aftercare following surgery on the circulatory system: Secondary | ICD-10-CM

## 2011-11-23 ENCOUNTER — Encounter: Payer: Self-pay | Admitting: Neurosurgery

## 2011-11-24 ENCOUNTER — Encounter: Payer: Self-pay | Admitting: Neurosurgery

## 2011-11-24 ENCOUNTER — Ambulatory Visit (INDEPENDENT_AMBULATORY_CARE_PROVIDER_SITE_OTHER): Payer: Medicare Other | Admitting: Neurosurgery

## 2011-11-24 ENCOUNTER — Other Ambulatory Visit (INDEPENDENT_AMBULATORY_CARE_PROVIDER_SITE_OTHER): Payer: Medicare Other | Admitting: *Deleted

## 2011-11-24 VITALS — BP 146/61 | HR 57 | Resp 14 | Ht 62.0 in | Wt 158.0 lb

## 2011-11-24 DIAGNOSIS — Z48812 Encounter for surgical aftercare following surgery on the circulatory system: Secondary | ICD-10-CM

## 2011-11-24 DIAGNOSIS — I6529 Occlusion and stenosis of unspecified carotid artery: Secondary | ICD-10-CM

## 2011-11-24 NOTE — Progress Notes (Addendum)
[facility] [place] HISTORY AND PHYSICAL   CC: Six-month postop for bilateral staged carotid endarterectomies Referring Physician: Early  History of Present Illness: Is is 78 year old patient of Dr. early who underwent staged carotid endarterectomies, the right in October 2012 the left in September 2012. Patient reports no signs or symptoms CVA, TIA, diplopia, dysphasia, amaurosis fugax or word finding difficulty. She has no vascular complaints today and reports no new medical problems no recent surgeries.  Past Medical History  Diagnosis Date  . High blood pressure   . Chest tightness   . Palpitations   . Anemia   . Leg pain     with walking  . Carotid arterial disease     asymptomatic     ROS: [x]  Positive   [ ]  Denies    General: [ ]  Weight loss, [ ]  Fever, [ ]  chills Neurologic: [ ]  Dizziness, [ ]  Blackouts, [ ]  Seizure [ ]  Stroke, [ ]  "Mini stroke", [ ]  Slurred speech, [ ]  Temporary blindness; [ ]  weakness in arms or legs, [ ]  Hoarseness Cardiac: [ ]  Chest pain/pressure, [ ]  Shortness of breath at rest [ ]  Shortness of breath with exertion, [ ]  Atrial fibrillation or irregular heartbeat Vascular: [ ]  Pain in legs with walking, [ ]  Pain in legs at rest, [ ]  Pain in legs at night,  [ ]  Non-healing ulcer, [ ]  Blood clot in vein/DVT,   Pulmonary: [ ]  Home oxygen, [ ]  Productive cough, [ ]  Coughing up blood, [ ]  Asthma,  [ ]  Wheezing Musculoskeletal:  [ ]  Arthritis, [ ]  Low back pain, [ ]  Joint pain Hematologic: [ ]  Easy Bruising, [ ]  Anemia; [ ]  Hepatitis Gastrointestinal: [ ]  Blood in stool, [ ]  Gastroesophageal Reflux/heartburn, [ ]  Trouble swallowing Urinary: [ ]  chronic Kidney disease, [ ]  on HD - [ ]  MWF or [ ]  TTHS, [ ]  Burning with urination, [ ]  Difficulty urinating Skin: [ ]  Rashes, [ ]  Wounds Psychological: [ ]  Anxiety, [ ]  Depression   Social History History  Substance Use Topics  . Smoking status: Never Smoker   . Smokeless tobacco: Never  Used  . Alcohol Use: No    Family History Family History  Problem Relation Age of Onset  . Lung disease Mother   . Heart disease Father   . Cancer Sister     liver cancer  . Heart disease Brother     No Known Allergies  Current Outpatient Prescriptions  Medication Sig Dispense Refill  . amLODipine (NORVASC) 5 MG tablet Take 5 mg by mouth daily.        [name] aspirin 81 MG tablet Take 81 mg by mouth daily.        . Cholecalciferol (VITAMIN D PO) Take 2,000 mg by mouth daily.        . isosorbide mononitrate (IMDUR) 60 MG 24 hr tablet Take 60 mg by mouth daily.        . metoprolol (TOPROL-XL) 100 MG 24 hr tablet Take 100 mg by mouth 2 (two) times daily.        [name] NAPROXEN PO Take 500 mg by mouth.       [name] rOPINIRole (REQUIP) 1 MG tablet Take 1 mg by mouth 2 (two) times daily.        . rosuvastatin (CRESTOR) 10 MG tablet Take 10 mg by mouth daily.        [name] triamterene-hydrochlorothiazide (MAXZIDE-25) 37.5-25 MG per tablet Take 1 tablet  by mouth every Monday, Wednesday, and Friday.       . ibandronate (BONIVA) 150 MG tablet Take 150 mg by mouth every 30 (thirty) days. Take in the morning with a full glass of water, on an empty stomach, and do not take anything else by mouth or lie down for the next 30 min.         Physical Examination  Filed Vitals:   11/24/11 1240  BP: 146/61  Pulse: 57  Resp:     Body mass index is 28.90 kg/(m^2).  General:  WDWN in NAD Gait: Normal HEENT: WNL Eyes: Pupils equal Pulmonary: normal non-labored breathing , without Rales, rhonchi,  wheezing Cardiac: RRR, without  Murmurs, rubs or gallops; Abdomen: soft, NT, no masses Skin: no rashes, ulcers noted  Vascular Exam Pulses: 2+ radial pulses bilaterally Carotid bruits: Patient has 1-2+ carotid pulses to auscultation bilaterally Extremities without ischemic changes, no Gangrene , no cellulitis; no open wounds;  Musculoskeletal: no muscle wasting or atrophy   Neurologic: A&O X 3; Appropriate Affect  ; SENSATION: normal; MOTOR FUNCTION:  moving all extremities equally. Speech is fluent/normal  Non-Invasive Vascular Imaging CAROTID DUPLEX 11/24/2011  Right ICA 0 - 19% stenosis Left ICA 0 - 19% stenosis   ASSESSMENT/PLAN: Six-month status post staged bilateral carotid endarterectomies doing well, the patient will followup in one year for repeat carotid duplex, her questions were encouraged and answered.  Lauree Chandler ANP   Clinic MD: Early  I have reviewed and agree with above.  EARLY, TODD, MD 12/01/2011 2:24 PM

## 2011-11-25 NOTE — Progress Notes (Addendum)
Addended by: Sharee Pimple on: 11/25/2011 10:54 AM  Modules accepted: Orders Agree with plan

## 2011-11-30 DIAGNOSIS — E7800 Pure hypercholesterolemia, unspecified: Secondary | ICD-10-CM | POA: Diagnosis not present

## 2011-11-30 DIAGNOSIS — Z79899 Other long term (current) drug therapy: Secondary | ICD-10-CM | POA: Diagnosis not present

## 2011-12-01 NOTE — Procedures (Unsigned)
CAROTID DUPLEX EXAM  INDICATION:  Followup bilateral carotid endarterectomies  HISTORY: Diabetes:  No Cardiac:  Yes Hypertension:  Yes Smoking:  No Previous Surgery:  Right carotid endarterectomy 05/11/2011, left carotid endarterectomy 03/30/2011, both performed by Dr. Arbie Cookey CV History:  Currently asymptomatic Amaurosis Fugax No, Paresthesias No, Hemiparesis No                                      RIGHT             LEFT Brachial systolic pressure:         155               158 Brachial Doppler waveforms:         Normal            Normal Vertebral direction of flow:        Antegrade         Antegrade DUPLEX VELOCITIES (cm/sec) CCA peak systolic                   84                64 ECA peak systolic                   68                142 ICA peak systolic                   87                125 ICA end diastolic                   20                22 PLAQUE MORPHOLOGY:                                    Mixed PLAQUE AMOUNT:                      None              Minimal PLAQUE LOCATION:                                      ECA  IMPRESSION: 1. Patent bilateral carotid endarterectomy sites with no evidence of     restenosis. 2. Left external carotid artery stenosis. 3. Antegrade vertebral arteries bilaterally.  ___________________________________________ Larina Earthly, M.D.  EM/MEDQ  D:  11/24/2011  T:  11/24/2011  Job:  [id number]

## 2011-12-03 DIAGNOSIS — L538 Other specified erythematous conditions: Secondary | ICD-10-CM | POA: Diagnosis not present

## 2011-12-03 DIAGNOSIS — Z79899 Other long term (current) drug therapy: Secondary | ICD-10-CM | POA: Diagnosis not present

## 2011-12-03 DIAGNOSIS — I10 Essential (primary) hypertension: Secondary | ICD-10-CM | POA: Diagnosis not present

## 2011-12-03 DIAGNOSIS — E7800 Pure hypercholesterolemia, unspecified: Secondary | ICD-10-CM | POA: Diagnosis not present

## 2011-12-24 DIAGNOSIS — E782 Mixed hyperlipidemia: Secondary | ICD-10-CM | POA: Diagnosis not present

## 2011-12-24 DIAGNOSIS — I739 Peripheral vascular disease, unspecified: Secondary | ICD-10-CM | POA: Diagnosis not present

## 2011-12-24 DIAGNOSIS — I2510 Atherosclerotic heart disease of native coronary artery without angina pectoris: Secondary | ICD-10-CM | POA: Diagnosis not present

## 2012-01-07 DIAGNOSIS — Z1231 Encounter for screening mammogram for malignant neoplasm of breast: Secondary | ICD-10-CM | POA: Diagnosis not present

## 2012-01-12 DIAGNOSIS — R928 Other abnormal and inconclusive findings on diagnostic imaging of breast: Secondary | ICD-10-CM | POA: Diagnosis not present

## 2012-03-01 DIAGNOSIS — I10 Essential (primary) hypertension: Secondary | ICD-10-CM | POA: Diagnosis not present

## 2012-03-01 DIAGNOSIS — I701 Atherosclerosis of renal artery: Secondary | ICD-10-CM | POA: Diagnosis not present

## 2012-04-28 DIAGNOSIS — Z23 Encounter for immunization: Secondary | ICD-10-CM | POA: Diagnosis not present

## 2012-06-07 DIAGNOSIS — Z0000 Encounter for general adult medical examination without abnormal findings: Secondary | ICD-10-CM | POA: Diagnosis not present

## 2012-06-07 DIAGNOSIS — I10 Essential (primary) hypertension: Secondary | ICD-10-CM | POA: Diagnosis not present

## 2012-06-07 DIAGNOSIS — M899 Disorder of bone, unspecified: Secondary | ICD-10-CM | POA: Diagnosis not present

## 2012-06-07 DIAGNOSIS — M949 Disorder of cartilage, unspecified: Secondary | ICD-10-CM | POA: Diagnosis not present

## 2012-06-07 DIAGNOSIS — E7800 Pure hypercholesterolemia, unspecified: Secondary | ICD-10-CM | POA: Diagnosis not present

## 2012-06-09 DIAGNOSIS — G2581 Restless legs syndrome: Secondary | ICD-10-CM | POA: Diagnosis not present

## 2012-06-09 DIAGNOSIS — M899 Disorder of bone, unspecified: Secondary | ICD-10-CM | POA: Diagnosis not present

## 2012-06-09 DIAGNOSIS — Z0000 Encounter for general adult medical examination without abnormal findings: Secondary | ICD-10-CM | POA: Diagnosis not present

## 2012-06-09 DIAGNOSIS — E7800 Pure hypercholesterolemia, unspecified: Secondary | ICD-10-CM | POA: Diagnosis not present

## 2012-06-09 DIAGNOSIS — I10 Essential (primary) hypertension: Secondary | ICD-10-CM | POA: Diagnosis not present

## 2012-07-07 DIAGNOSIS — I359 Nonrheumatic aortic valve disorder, unspecified: Secondary | ICD-10-CM | POA: Diagnosis not present

## 2012-07-07 DIAGNOSIS — I739 Peripheral vascular disease, unspecified: Secondary | ICD-10-CM | POA: Diagnosis not present

## 2012-07-07 DIAGNOSIS — I2510 Atherosclerotic heart disease of native coronary artery without angina pectoris: Secondary | ICD-10-CM | POA: Diagnosis not present

## 2012-08-09 ENCOUNTER — Ambulatory Visit (HOSPITAL_COMMUNITY)
Admission: RE | Admit: 2012-08-09 | Discharge: 2012-08-09 | Disposition: A | Discharge status: Home / Self Care | Payer: Medicare Other | Source: Ambulatory Visit | Attending: Cardiovascular Disease | Admitting: Cardiovascular Disease

## 2012-08-09 ENCOUNTER — Other Ambulatory Visit (HOSPITAL_COMMUNITY): Payer: Self-pay | Admitting: Cardiovascular Disease

## 2012-08-09 DIAGNOSIS — I059 Rheumatic mitral valve disease, unspecified: Secondary | ICD-10-CM | POA: Insufficient documentation

## 2012-08-09 DIAGNOSIS — I350 Nonrheumatic aortic (valve) stenosis: Secondary | ICD-10-CM

## 2012-08-09 DIAGNOSIS — R011 Cardiac murmur, unspecified: Secondary | ICD-10-CM | POA: Insufficient documentation

## 2012-08-09 DIAGNOSIS — I369 Nonrheumatic tricuspid valve disorder, unspecified: Secondary | ICD-10-CM | POA: Insufficient documentation

## 2012-08-09 DIAGNOSIS — I10 Essential (primary) hypertension: Secondary | ICD-10-CM | POA: Diagnosis not present

## 2012-08-09 DIAGNOSIS — I359 Nonrheumatic aortic valve disorder, unspecified: Secondary | ICD-10-CM | POA: Diagnosis not present

## 2012-08-09 NOTE — Progress Notes (Signed)
2D Echo Performed 06/19/2013    [name], RCS.  

## 2012-09-12 DIAGNOSIS — Z961 Presence of intraocular lens: Secondary | ICD-10-CM | POA: Diagnosis not present

## 2012-09-12 DIAGNOSIS — H52209 Unspecified astigmatism, unspecified eye: Secondary | ICD-10-CM | POA: Diagnosis not present

## 2012-10-12 DIAGNOSIS — M720 Palmar fascial fibromatosis [Dupuytren]: Secondary | ICD-10-CM | POA: Diagnosis not present

## 2012-10-12 DIAGNOSIS — M6530 Trigger finger, unspecified finger: Secondary | ICD-10-CM | POA: Diagnosis not present

## 2012-10-25 DIAGNOSIS — I10 Essential (primary) hypertension: Secondary | ICD-10-CM | POA: Diagnosis not present

## 2012-10-25 DIAGNOSIS — E782 Mixed hyperlipidemia: Secondary | ICD-10-CM | POA: Diagnosis not present

## 2012-10-25 DIAGNOSIS — I2510 Atherosclerotic heart disease of native coronary artery without angina pectoris: Secondary | ICD-10-CM | POA: Diagnosis not present

## 2012-11-23 ENCOUNTER — Other Ambulatory Visit (INDEPENDENT_AMBULATORY_CARE_PROVIDER_SITE_OTHER): Payer: Medicare Other | Admitting: *Deleted

## 2012-11-23 ENCOUNTER — Ambulatory Visit: Payer: Medicare Other | Admitting: Neurosurgery

## 2012-11-23 DIAGNOSIS — I6529 Occlusion and stenosis of unspecified carotid artery: Secondary | ICD-10-CM

## 2012-11-23 DIAGNOSIS — Z48812 Encounter for surgical aftercare following surgery on the circulatory system: Secondary | ICD-10-CM

## 2012-11-24 ENCOUNTER — Encounter: Payer: Self-pay | Admitting: Vascular Surgery

## 2012-11-24 ENCOUNTER — Other Ambulatory Visit: Payer: Self-pay | Admitting: *Deleted

## 2012-11-24 DIAGNOSIS — Z48812 Encounter for surgical aftercare following surgery on the circulatory system: Secondary | ICD-10-CM

## 2012-12-19 DIAGNOSIS — E7800 Pure hypercholesterolemia, unspecified: Secondary | ICD-10-CM | POA: Diagnosis not present

## 2012-12-19 DIAGNOSIS — I2510 Atherosclerotic heart disease of native coronary artery without angina pectoris: Secondary | ICD-10-CM | POA: Diagnosis not present

## 2012-12-19 DIAGNOSIS — I10 Essential (primary) hypertension: Secondary | ICD-10-CM | POA: Diagnosis not present

## 2012-12-22 DIAGNOSIS — M899 Disorder of bone, unspecified: Secondary | ICD-10-CM | POA: Diagnosis not present

## 2012-12-22 DIAGNOSIS — I10 Essential (primary) hypertension: Secondary | ICD-10-CM | POA: Diagnosis not present

## 2012-12-22 DIAGNOSIS — G2581 Restless legs syndrome: Secondary | ICD-10-CM | POA: Diagnosis not present

## 2012-12-22 DIAGNOSIS — E7800 Pure hypercholesterolemia, unspecified: Secondary | ICD-10-CM | POA: Diagnosis not present

## 2012-12-22 DIAGNOSIS — M949 Disorder of cartilage, unspecified: Secondary | ICD-10-CM | POA: Diagnosis not present

## 2012-12-22 DIAGNOSIS — I2510 Atherosclerotic heart disease of native coronary artery without angina pectoris: Secondary | ICD-10-CM | POA: Diagnosis not present

## 2012-12-22 DIAGNOSIS — Z006 Encounter for examination for normal comparison and control in clinical research program: Secondary | ICD-10-CM | POA: Diagnosis not present

## 2013-01-05 DIAGNOSIS — M810 Age-related osteoporosis without current pathological fracture: Secondary | ICD-10-CM | POA: Diagnosis not present

## 2013-01-05 DIAGNOSIS — I2510 Atherosclerotic heart disease of native coronary artery without angina pectoris: Secondary | ICD-10-CM | POA: Diagnosis not present

## 2013-01-05 DIAGNOSIS — I10 Essential (primary) hypertension: Secondary | ICD-10-CM | POA: Diagnosis not present

## 2013-02-08 ENCOUNTER — Other Ambulatory Visit (HOSPITAL_COMMUNITY): Payer: Self-pay | Admitting: Cardiovascular Disease

## 2013-02-08 ENCOUNTER — Telehealth (HOSPITAL_COMMUNITY): Payer: Self-pay | Admitting: Cardiovascular Disease

## 2013-02-08 DIAGNOSIS — I701 Atherosclerosis of renal artery: Secondary | ICD-10-CM

## 2013-02-09 ENCOUNTER — Telehealth (HOSPITAL_COMMUNITY): Payer: Self-pay | Admitting: Cardiovascular Disease

## 2013-02-22 ENCOUNTER — Encounter (HOSPITAL_COMMUNITY): Payer: Medicare Other

## 2013-02-23 ENCOUNTER — Ambulatory Visit (HOSPITAL_COMMUNITY)
Admission: RE | Admit: 2013-02-23 | Discharge: 2013-02-23 | Disposition: A | Discharge status: Home / Self Care | Payer: Medicare Other | Source: Ambulatory Visit | Attending: Cardiovascular Disease | Admitting: Cardiovascular Disease

## 2013-02-23 DIAGNOSIS — I701 Atherosclerosis of renal artery: Secondary | ICD-10-CM | POA: Insufficient documentation

## 2013-02-23 HISTORY — PX: OTHER SURGICAL HISTORY: SHX169

## 2013-02-23 NOTE — Progress Notes (Signed)
Renal Artery Duplex Completed. °Brianna L Mazza ° °

## 2013-02-28 DIAGNOSIS — I701 Atherosclerosis of renal artery: Secondary | ICD-10-CM | POA: Diagnosis not present

## 2013-02-28 DIAGNOSIS — E782 Mixed hyperlipidemia: Secondary | ICD-10-CM | POA: Diagnosis not present

## 2013-02-28 DIAGNOSIS — I2510 Atherosclerotic heart disease of native coronary artery without angina pectoris: Secondary | ICD-10-CM | POA: Diagnosis not present

## 2013-03-07 ENCOUNTER — Encounter: Payer: Self-pay | Admitting: Cardiovascular Disease

## 2013-04-20 DIAGNOSIS — Z23 Encounter for immunization: Secondary | ICD-10-CM | POA: Diagnosis not present

## 2013-05-15 DIAGNOSIS — L255 Unspecified contact dermatitis due to plants, except food: Secondary | ICD-10-CM | POA: Diagnosis not present

## 2013-06-12 ENCOUNTER — Other Ambulatory Visit: Payer: Self-pay | Admitting: *Deleted

## 2013-06-12 MED ORDER — AMLODIPINE BESYLATE 5 MG PO TABS: 7.5000 mg | ORAL_TABLET | Freq: Every day | ORAL | Status: DC

## 2013-06-12 NOTE — Telephone Encounter (Signed)
Rx was sent to pharmacy electronically. 

## 2013-06-20 DIAGNOSIS — N8111 Cystocele, midline: Secondary | ICD-10-CM | POA: Diagnosis not present

## 2013-06-20 DIAGNOSIS — Z01419 Encounter for gynecological examination (general) (routine) without abnormal findings: Secondary | ICD-10-CM | POA: Diagnosis not present

## 2013-06-20 DIAGNOSIS — N951 Menopausal and female climacteric states: Secondary | ICD-10-CM | POA: Diagnosis not present

## 2013-06-20 DIAGNOSIS — Z0000 Encounter for general adult medical examination without abnormal findings: Secondary | ICD-10-CM | POA: Diagnosis not present

## 2013-06-20 DIAGNOSIS — N815 Vaginal enterocele: Secondary | ICD-10-CM | POA: Diagnosis not present

## 2013-08-03 DIAGNOSIS — I10 Essential (primary) hypertension: Secondary | ICD-10-CM | POA: Diagnosis not present

## 2013-08-03 DIAGNOSIS — E7800 Pure hypercholesterolemia, unspecified: Secondary | ICD-10-CM | POA: Diagnosis not present

## 2013-08-03 DIAGNOSIS — M899 Disorder of bone, unspecified: Secondary | ICD-10-CM | POA: Diagnosis not present

## 2013-08-03 DIAGNOSIS — Z79899 Other long term (current) drug therapy: Secondary | ICD-10-CM | POA: Diagnosis not present

## 2013-08-08 DIAGNOSIS — R05 Cough: Secondary | ICD-10-CM | POA: Diagnosis not present

## 2013-08-08 DIAGNOSIS — E7800 Pure hypercholesterolemia, unspecified: Secondary | ICD-10-CM | POA: Diagnosis not present

## 2013-08-08 DIAGNOSIS — R059 Cough, unspecified: Secondary | ICD-10-CM | POA: Diagnosis not present

## 2013-08-08 DIAGNOSIS — G2581 Restless legs syndrome: Secondary | ICD-10-CM | POA: Diagnosis not present

## 2013-08-08 DIAGNOSIS — I10 Essential (primary) hypertension: Secondary | ICD-10-CM | POA: Diagnosis not present

## 2013-08-14 DIAGNOSIS — Z1231 Encounter for screening mammogram for malignant neoplasm of breast: Secondary | ICD-10-CM | POA: Diagnosis not present

## 2013-08-16 ENCOUNTER — Other Ambulatory Visit: Payer: Self-pay | Admitting: *Deleted

## 2013-08-16 MED ORDER — TRIAMTERENE-HCTZ 37.5-25 MG PO TABS: 1.0000 | ORAL_TABLET | Freq: Every day | ORAL | Status: DC

## 2013-08-24 ENCOUNTER — Other Ambulatory Visit (HOSPITAL_COMMUNITY): Payer: Self-pay | Admitting: Cardiovascular Disease

## 2013-08-24 ENCOUNTER — Ambulatory Visit (HOSPITAL_COMMUNITY)
Admission: RE | Admit: 2013-08-24 | Discharge: 2013-08-24 | Disposition: A | Discharge status: Home / Self Care | Payer: Medicare Other | Source: Ambulatory Visit | Attending: Cardiovascular Disease | Admitting: Cardiovascular Disease

## 2013-08-24 DIAGNOSIS — I701 Atherosclerosis of renal artery: Secondary | ICD-10-CM

## 2013-08-24 NOTE — Progress Notes (Signed)
Renal Artery Duplex Completed. °Brianna L Mazza,RVT °

## 2013-09-04 ENCOUNTER — Other Ambulatory Visit: Payer: Self-pay | Admitting: Vascular Surgery

## 2013-09-04 DIAGNOSIS — Z48812 Encounter for surgical aftercare following surgery on the circulatory system: Secondary | ICD-10-CM

## 2013-09-04 DIAGNOSIS — I6529 Occlusion and stenosis of unspecified carotid artery: Secondary | ICD-10-CM

## 2013-09-07 ENCOUNTER — Encounter: Payer: Self-pay | Admitting: Cardiovascular Disease

## 2013-09-13 DIAGNOSIS — I10 Essential (primary) hypertension: Secondary | ICD-10-CM | POA: Diagnosis not present

## 2013-09-21 ENCOUNTER — Encounter: Payer: Self-pay | Admitting: Cardiovascular Disease

## 2013-09-21 ENCOUNTER — Ambulatory Visit (INDEPENDENT_AMBULATORY_CARE_PROVIDER_SITE_OTHER): Payer: Medicare Other | Admitting: Cardiovascular Disease

## 2013-09-21 VITALS — BP 156/80 | HR 67 | Resp 16 | Ht 62.0 in | Wt 156.0 lb

## 2013-09-21 DIAGNOSIS — I440 Atrioventricular block, first degree: Secondary | ICD-10-CM

## 2013-09-21 DIAGNOSIS — I701 Atherosclerosis of renal artery: Secondary | ICD-10-CM | POA: Insufficient documentation

## 2013-09-21 DIAGNOSIS — I2510 Atherosclerotic heart disease of native coronary artery without angina pectoris: Secondary | ICD-10-CM | POA: Diagnosis not present

## 2013-09-21 DIAGNOSIS — I6529 Occlusion and stenosis of unspecified carotid artery: Secondary | ICD-10-CM | POA: Diagnosis not present

## 2013-09-21 DIAGNOSIS — E785 Hyperlipidemia, unspecified: Secondary | ICD-10-CM | POA: Diagnosis not present

## 2013-09-21 NOTE — Assessment & Plan Note (Signed)
Not clinically important, no other signs of meaningful AV node conduction disease. Tolerating high-dose beta blocker

## 2013-09-21 NOTE — Assessment & Plan Note (Signed)
Excellent lipid parameters

## 2013-09-21 NOTE — Assessment & Plan Note (Signed)
Dr. Renne CriglerPharr already made the excellent choice to switch her to an angiotensin receptor blocker. As long as her renal function remains normal and her blood pressure can be well controlled we can defer revascularization. The velocities are getting to be rather high and if there is evidence of impending arterial occlusion, I would refer to Dr. Allyson SabalBerry or Dr. Arbie CookeyEarly to discuss angioplasty/stent.

## 2013-09-21 NOTE — Progress Notes (Signed)
Patient ID: [name], female   DOB: May 09, 1927, 80 y.o.   MRN: [id number]      Reason for office visit PAD, hypertension, CAD  [name] is a patient of Dr. Rocco Serene for many years and is here to establish new cardiology followup. She has well-established peripheral arterial disease and has undergone sequential bilateral carotid endarterectomies. There has been a suspicion of renal artery stenosis for many years, even though angiography in 2005 did not demonstrate this. Her most recent duplex ultrasound shows significant ostial stenosis of the right renal artery, with velocities as high as 5 m/s. Her blood pressure recently has become harder to control and Dr. Renne Crigler recently may be excellent the decision to switch her to an angiotensin receptor blocker. She has yet to receive that prescription from her mail-order pharmacy. Her blood pressure is moderately elevated today. She is however asymptomatic. Renal function remains excellent.  In 2005 she had coronary angiography which showed an 80% posterolateral ventricular artery stenosis that was left for medical treatment. She does not have angina pectoris or any manifestations of congestive heart failure. She takes a statin for hyperlipidemia. She has never had a stroke or TIA and denies intermittent claudication.  She has been a widow for 12 years and lives at [facility].   No Known Allergies  Current Outpatient Prescriptions  Medication Sig Dispense Refill  . amLODipine (NORVASC) 5 MG tablet Take 1.5 tablets (7.5 mg total) by mouth daily.  135 tablet  6  . aspirin 81 MG tablet Take 81 mg by mouth daily.        . Cholecalciferol (VITAMIN D PO) Take 2,000 mg by mouth daily.        . isosorbide mononitrate (IMDUR) 60 MG 24 hr tablet Take 30 mg by mouth daily.       . metoprolol (TOPROL-XL) 100 MG 24 hr tablet Take 100 mg by mouth 2 (two) times daily.        [name] NAPROXEN PO Take 500 mg by mouth.       [name] rOPINIRole (REQUIP) 1  MG tablet Take 1 mg by mouth 2 (two) times daily.        . rosuvastatin (CRESTOR) 10 MG tablet Take 10 mg by mouth daily.        [name] triamterene-hydrochlorothiazide (MAXZIDE-25) 37.5-25 MG per tablet Take 1 tablet by mouth daily.  90 tablet  2  . losartan-hydrochlorothiazide (HYZAAR) 100-25 MG per tablet daily.      [name] PREMARIN vaginal cream 2 (two) times a week.       No current facility-administered medications for this visit.    Past Medical History  Diagnosis Date  . High blood pressure   . Chest tightness   . Palpitations   . Anemia   . Leg pain     with walking  . Carotid arterial disease     asymptomatic     Past Surgical History  Procedure Laterality Date  . Eye surgery  2011    Cataract surgery Bilateral  . Eye surgery  2011    Bilateral eyelid surgery for ptosis  . Carotid endarterectomy      Family History  Problem Relation Age of Onset  . Lung disease Mother   . Heart disease Father   . Cancer Sister     liver cancer  . Heart disease Brother     History   Social History  . Marital Status: Widowed    Spouse Name: N/A  Number of Children: N/A  . Years of Education: N/A   Occupational History  . Not on file.   Social History Main Topics  . Smoking status: Never Smoker   . Smokeless tobacco: Never Used  . Alcohol Use: No  . Drug Use: No  . Sexual Activity:    Other Topics Concern  . Not on file   Social History Narrative  . No narrative on file    Review of systems: The patient specifically denies any chest pain at rest or with exertion, dyspnea at rest or with exertion, orthopnea, paroxysmal nocturnal dyspnea, syncope, palpitations, focal neurological deficits, intermittent claudication, lower extremity edema, unexplained weight gain, cough, hemoptysis or wheezing.  The patient also denies abdominal pain, nausea, vomiting, dysphagia, diarrhea, constipation, polyuria, polydipsia, dysuria, hematuria, frequency, urgency, abnormal bleeding or  bruising, fever, chills, unexpected weight changes, mood swings, change in skin or hair texture, change in voice quality, auditory or visual problems, allergic reactions or rashes, new musculoskeletal complaints other than usual "aches and pains".   PHYSICAL EXAM BP 156/80  Pulse 67  Resp 16  Ht 5\' 2"  (1.575 m)  Wt 70.761 kg (156 lb)  BMI 28.53 kg/m2  General: Alert, oriented x3, no distress Head: no evidence of trauma, PERRL, EOMI, no exophtalmos or lid lag, no myxedema, no xanthelasma; normal ears, nose and oropharynx Neck: normal jugular venous pulsations and no hepatojugular reflux; brisk carotid pulses without delay; old healed bilateral endarterectomy scars; faint bilateral carotid bruits, left louder than right Chest: clear to auscultation, no signs of consolidation by percussion or palpation, normal fremitus, symmetrical and full respiratory excursions Cardiovascular: normal position and quality of the apical impulse, regular rhythm, normal first and second heart sounds, no  rubs or gallops, early systolic murmur aortic focus, 2/6 in intensity Abdomen: no tenderness or distention, no masses by palpation, no abnormal pulsatility or arterial bruits, normal bowel sounds, no hepatosplenomegaly Extremities: no clubbing, cyanosis or edema; 2+ radial, ulnar and brachial pulses bilaterally; 2+ right femoral, posterior tibial and dorsalis pedis pulses; 2+ left femoral, posterior tibial and dorsalis pedis pulses; no subclavian or femoral bruits Neurological: grossly nonfocal   EKG: Sinus rhythm with first degree AV block  Lipid Panel  Total cholesterol 122, triglycerides 48, HDL 46, LDL 66, normal liver function tests  BMET    Component Value Date/Time   NA 139 05/12/2011 0400   K 3.8 05/12/2011 0400   CL 104 05/12/2011 0400   CO2 26 05/12/2011 0400   GLUCOSE 113* 05/12/2011 0400   BUN 10 05/12/2011 0400   CREATININE 0.63 05/12/2011 0400   CALCIUM 9.0 05/12/2011 0400   GFRNONAA 81*  05/12/2011 0400   GFRAA >90 05/12/2011 0400   November 2013 BUN 22, creatinine 1.0 Do not have her most recent renal function studies but reportedly these were normal within the last couple of months   ASSESSMENT AND PLAN CAD (coronary artery disease) Asymptomatic 80% stenosis in the posterior lateral ventricular branch of the right coronary artery, continue medical therapy  Carotid artery stenosis, asymptomatic Is post bilateral carotid endarterectomy, followed by Dr. Arbie CookeyEarly  First degree AV block Not clinically important, no other signs of meaningful AV node conduction disease. Tolerating high-dose beta blocker  Hyperlipidemia Excellent lipid parameters  Right renal artery stenosis Dr. Renne CriglerPharr already made the excellent choice to switch her to an angiotensin receptor blocker. As long as her renal function remains normal and her blood pressure can be well controlled we can defer revascularization. The velocities are  getting to be rather high and if there is evidence of impending arterial occlusion, I would refer to Dr. Allyson Sabal or Dr. Arbie Cookey to discuss angioplasty/stent.   Orders Placed This Encounter  Procedures  . EKG 12-Lead  . Renal Artery Duplex Bilateral   Meds ordered this encounter  Medications  . PREMARIN vaginal cream    Sig: 2 (two) times a week.  . losartan-hydrochlorothiazide (HYZAAR) 100-25 MG per tablet    Sig: daily.    Junious Silk, MD, [facility] [phone] office [phone] pager

## 2013-09-21 NOTE — Assessment & Plan Note (Signed)
Is post bilateral carotid endarterectomy, followed by Dr. Arbie CookeyEarly

## 2013-09-21 NOTE — Patient Instructions (Signed)
Your physician recommends that you schedule a follow-up appointment and renal doppler in 6 months.  Your physician has requested that you have a renal artery duplex. During this test, an ultrasound is used to evaluate blood flow to the kidneys. Allow one hour for this exam. Do not eat after midnight the day before and avoid carbonated beverages. Take your medications as you usually do.

## 2013-09-21 NOTE — Assessment & Plan Note (Signed)
Asymptomatic 80% stenosis in the posterior lateral ventricular branch of the right coronary artery, continue medical therapy

## 2013-10-11 DIAGNOSIS — E7800 Pure hypercholesterolemia, unspecified: Secondary | ICD-10-CM | POA: Diagnosis not present

## 2013-10-11 DIAGNOSIS — I10 Essential (primary) hypertension: Secondary | ICD-10-CM | POA: Diagnosis not present

## 2013-10-11 DIAGNOSIS — I2510 Atherosclerotic heart disease of native coronary artery without angina pectoris: Secondary | ICD-10-CM | POA: Diagnosis not present

## 2013-11-08 DIAGNOSIS — H52209 Unspecified astigmatism, unspecified eye: Secondary | ICD-10-CM | POA: Diagnosis not present

## 2013-11-08 DIAGNOSIS — Z961 Presence of intraocular lens: Secondary | ICD-10-CM | POA: Diagnosis not present

## 2013-11-27 ENCOUNTER — Encounter: Payer: Self-pay | Admitting: Vascular Surgery

## 2013-11-28 ENCOUNTER — Ambulatory Visit (HOSPITAL_COMMUNITY)
Admission: RE | Admit: 2013-11-28 | Discharge: 2013-11-28 | Disposition: A | Discharge status: Home / Self Care | Payer: Medicare Other | Source: Ambulatory Visit | Attending: Vascular Surgery | Admitting: Vascular Surgery

## 2013-11-28 ENCOUNTER — Encounter: Payer: Self-pay | Admitting: Vascular Surgery

## 2013-11-28 ENCOUNTER — Ambulatory Visit (INDEPENDENT_AMBULATORY_CARE_PROVIDER_SITE_OTHER): Payer: Medicare Other | Admitting: Vascular Surgery

## 2013-11-28 ENCOUNTER — Encounter (INDEPENDENT_AMBULATORY_CARE_PROVIDER_SITE_OTHER): Payer: Self-pay

## 2013-11-28 VITALS — BP 148/68 | HR 61 | Resp 18 | Ht 62.0 in | Wt 157.0 lb

## 2013-11-28 DIAGNOSIS — I6529 Occlusion and stenosis of unspecified carotid artery: Secondary | ICD-10-CM | POA: Diagnosis not present

## 2013-11-28 DIAGNOSIS — Z48812 Encounter for surgical aftercare following surgery on the circulatory system: Secondary | ICD-10-CM | POA: Diagnosis not present

## 2013-11-28 NOTE — Addendum Note (Signed)
Addended by: Sharee PimpleMCCHESNEY, MARILYN K on: 11/28/2013 02:51 PM   Modules accepted: Orders

## 2013-11-28 NOTE — Progress Notes (Signed)
Today for continued followup of her extracranial cerebrovascular occlusive disease. She is status post staged carotid endarterectomies by myself in September and October 2012. She remains asymptomatic. She is a quite active with no new medical difficulties. She is being followed for asymptomatic renal artery stenosis as well  Past Medical History  Diagnosis Date  . High blood pressure   . Chest tightness   . Palpitations   . Anemia   . Leg pain     with walking  . Carotid arterial disease     asymptomatic     History  Substance Use Topics  . Smoking status: Never Smoker   . Smokeless tobacco: Never Used  . Alcohol Use: No    Family History  Problem Relation Age of Onset  . Lung disease Mother   . Heart disease Father   . Cancer Sister     liver cancer  . Heart disease Brother     No Known Allergies  Current outpatient prescriptions:amLODipine (NORVASC) 5 MG tablet, Take 1.5 tablets (7.5 mg total) by mouth daily., Disp: 135 tablet, Rfl: 6;  aspirin 81 MG tablet, Take 81 mg by mouth daily.  , Disp: , Rfl: ;  Cholecalciferol (VITAMIN D PO), Take 2,000 mg by mouth daily.  , Disp: , Rfl: ;  isosorbide mononitrate (IMDUR) 60 MG 24 hr tablet, Take 30 mg by mouth daily. , Disp: , Rfl:  losartan-hydrochlorothiazide (HYZAAR) 100-25 MG per tablet, daily., Disp: , Rfl: ;  MAGNESIUM ASPARTATE PO, Take by mouth daily., Disp: , Rfl: ;  metoprolol (TOPROL-XL) 100 MG 24 hr tablet, Take 100 mg by mouth 2 (two) times daily.  , Disp: , Rfl: ;  NAPROXEN PO, Take 500 mg by mouth. , Disp: , Rfl: ;  PREMARIN vaginal cream, 2 (two) times a week., Disp: , Rfl:  rOPINIRole (REQUIP) 1 MG tablet, Take 1 mg by mouth 2 (two) times daily.  , Disp: , Rfl: ;  rosuvastatin (CRESTOR) 10 MG tablet, Take 10 mg by mouth daily.  , Disp: , Rfl: ;  triamterene-hydrochlorothiazide (MAXZIDE-25) 37.5-25 MG per tablet, Take 1 tablet by mouth daily., Disp: 90 tablet, Rfl: 2  BP 148/68  Pulse 61  Resp 18  Ht 5\' 2"  (1.575 m)   Wt 157 lb (71.215 kg)  BMI 28.71 kg/m2  Body mass index is 28.71 kg/(m^2).       Physical exam well-developed well-nourished white female no acute distress Neurologically she is grossly intact Carotid incisions are well-healed bilaterally with no bruits bilaterally Radial pulses 2+ bilaterally Heart regular rate and rhythm  Carotid duplex evaluation her office was reviewed with the patient. This does show widely patent endarterectomy site bilaterally with no recurrent stenosis  Impression and plan: Stable status post bilateral staged carotid endarterectomies in 2012. We'll continue usual activities. We will see her again in one year for routine duplex followup. She will notify us should she develop any neurologic deficits

## 2014-01-29 DIAGNOSIS — E7800 Pure hypercholesterolemia, unspecified: Secondary | ICD-10-CM | POA: Diagnosis not present

## 2014-02-05 DIAGNOSIS — I10 Essential (primary) hypertension: Secondary | ICD-10-CM | POA: Diagnosis not present

## 2014-02-05 DIAGNOSIS — M545 Low back pain, unspecified: Secondary | ICD-10-CM | POA: Diagnosis not present

## 2014-02-20 ENCOUNTER — Telehealth (HOSPITAL_COMMUNITY): Payer: Self-pay | Admitting: *Deleted

## 2014-03-14 ENCOUNTER — Ambulatory Visit (HOSPITAL_COMMUNITY)
Admission: RE | Admit: 2014-03-14 | Discharge: 2014-03-14 | Disposition: A | Discharge status: Home / Self Care | Payer: Medicare Other | Source: Ambulatory Visit | Attending: Cardiovascular Disease | Admitting: Cardiovascular Disease

## 2014-03-14 DIAGNOSIS — I701 Atherosclerosis of renal artery: Secondary | ICD-10-CM

## 2014-03-14 DIAGNOSIS — I2510 Atherosclerotic heart disease of native coronary artery without angina pectoris: Secondary | ICD-10-CM | POA: Diagnosis present

## 2014-03-14 NOTE — Progress Notes (Signed)
Renal Artery Duplex Completed. °Brianna L Mazza,RVT °

## 2014-03-20 ENCOUNTER — Encounter: Payer: Self-pay | Admitting: *Deleted

## 2014-03-21 ENCOUNTER — Encounter: Payer: Self-pay | Admitting: Cardiovascular Disease

## 2014-03-21 ENCOUNTER — Telehealth: Payer: Self-pay | Admitting: Cardiovascular Disease

## 2014-03-21 ENCOUNTER — Ambulatory Visit (INDEPENDENT_AMBULATORY_CARE_PROVIDER_SITE_OTHER): Payer: Medicare Other | Admitting: Cardiovascular Disease

## 2014-03-21 VITALS — BP 130/52 | HR 54 | Resp 16 | Ht 62.0 in | Wt 153.9 lb

## 2014-03-21 DIAGNOSIS — I6529 Occlusion and stenosis of unspecified carotid artery: Secondary | ICD-10-CM

## 2014-03-21 DIAGNOSIS — I2510 Atherosclerotic heart disease of native coronary artery without angina pectoris: Secondary | ICD-10-CM | POA: Diagnosis not present

## 2014-03-21 DIAGNOSIS — I701 Atherosclerosis of renal artery: Secondary | ICD-10-CM | POA: Diagnosis not present

## 2014-03-21 DIAGNOSIS — E785 Hyperlipidemia, unspecified: Secondary | ICD-10-CM

## 2014-03-21 NOTE — Telephone Encounter (Signed)
Pt called in stating that she needed to notify Dr. Renaye Rakers nurse that she was taking Metoprolol tart  bid.Please call if you need to  Thanks

## 2014-03-21 NOTE — Patient Instructions (Signed)
Dr. Croitoru recommends that you schedule a follow-up appointment in: ONE YEAR   

## 2014-03-21 NOTE — Telephone Encounter (Signed)
Pt called in stating that she needed to notify Dr. Renaye Rakers nurse that she was taking Metoprolol Tart  bid. Please call back if you need to.   Thanks

## 2014-03-21 NOTE — Progress Notes (Signed)
Patient ID: [name], female   DOB: 09-17-1926, 80 y.o.   MRN: [id number]     Reason for office visit Followup CAD and PAD  Mrs. Majewski returns for a routine followup and does not really have any cardiovascular complaints. Unfortunately she fell in her home ([facility]) 2 days ago and has a right periorbital ecchymosis. She tripped on some bad shoes and did not have loss of consciousness or serious injury.   She has a fairly minor coronary stenosis that remains asymptomatic.  She saw Dr. Arbie Cookey in May for followup on her bilateral carotid endarterectomies, both sites remained widely patent without recurrent stenosis. She has not had any neurological events.  She also just had a renal duplex ultrasound that shows no change in her moderate to severe right renal artery stenosis (70-99%, velocities as high as 5 m/s). Kidney size remains normal. Renal function is also normal. Blood pressure control is fair.  She is on statin therapy for hyperlipidemia and takes daily aspirin.   No Known Allergies  Current Outpatient Prescriptions  Medication Sig Dispense Refill  . amLODipine (NORVASC) 5 MG tablet Take 1.5 tablets (7.5 mg total) by mouth daily.  135 tablet  6  . aspirin 81 MG tablet Take 81 mg by mouth as needed.       . Cholecalciferol (VITAMIN D PO) Take 2,000 mg by mouth daily.        . isosorbide mononitrate (IMDUR) 60 MG 24 hr tablet Take 30 mg by mouth daily.       [name] losartan-hydrochlorothiazide (HYZAAR) 100-25 MG per tablet daily.      [name] MAGNESIUM ASPARTATE PO Take by mouth daily.      . metoprolol (LOPRESSOR) 100 MG tablet Take 1 tablet by mouth 2 (two) times daily.      [name] NAPROXEN PO Take 500 mg by mouth as needed.       [name] PREMARIN vaginal cream 2 (two) times a week.      [name] rOPINIRole (REQUIP) 1 MG tablet Take 1 mg by mouth 2 (two) times daily.        . rosuvastatin (CRESTOR) 10 MG tablet Take 10 mg by mouth daily.        [name] triamterene-hydrochlorothiazide  (MAXZIDE-25) 37.5-25 MG per tablet Take 1 tablet by mouth daily.  90 tablet  2   No current facility-administered medications for this visit.    Past Medical History  Diagnosis Date  . High blood pressure   . Chest tightness   . Palpitations   . Anemia   . Leg pain     with walking  . Carotid arterial disease     asymptomatic     Past Surgical History  Procedure Laterality Date  . Eye surgery  2011    Cataract surgery Bilateral  . Eye surgery  2011    Bilateral eyelid surgery for ptosis  . Cardiac catheterization  07/15/04    noncritical CAD,80% PLA treated medically. EF% 60. no RAS.  [name] Renal artery duplex  02/23/13    ABDOMINAL AORTA:<50% DIAMETER REDUCTION. RIGHT RENAL ARTERY: 60-99% DIAMETER REDUCTION.[name] LEFT RENAL ARTERY:1-59% DIAMETER REDUCTION.  . Carotid endarterectomy      RIGHT=05/11/11, LEFT=03/30/11    Family History  Problem Relation Age of Onset  . Lung disease Mother   . Heart disease Father   . Cancer Sister     liver cancer  . Heart disease Brother     History   Social History  .  Marital Status: Widowed    Spouse Name: N/A    Number of Children: N/A  . Years of Education: N/A   Occupational History  . Not on file.   Social History Main Topics  . Smoking status: Never Smoker   . Smokeless tobacco: Never Used  . Alcohol Use: No  . Drug Use: No  . Sexual Activity: Not on file   Other Topics Concern  . Not on file   Social History Narrative  . No narrative on file    Review of systems: The patient specifically denies any chest pain at rest or with exertion, dyspnea at rest or with exertion, orthopnea, paroxysmal nocturnal dyspnea, syncope, palpitations, focal neurological deficits, intermittent claudication, lower extremity edema, unexplained weight gain, cough, hemoptysis or wheezing.  The patient also denies abdominal pain, nausea, vomiting, dysphagia, diarrhea, constipation, polyuria, polydipsia, dysuria, hematuria, frequency, urgency,  abnormal bleeding or bruising, fever, chills, unexpected weight changes, mood swings, change in skin or hair texture, change in voice quality, auditory or visual problems, allergic reactions or rashes, new musculoskeletal complaints other than usual "aches and pains".   PHYSICAL EXAM BP 130/52  Pulse 54  Resp 16  Ht  (1.575 m)  Wt 153 lb 14.4 oz (69.809 kg)  BMI 28.14 kg/m2 General: Alert, oriented x3, no distress  Head: no evidence of trauma, PERRL, EOMI, no exophtalmos or lid lag, no myxedema, no xanthelasma; normal ears, nose and oropharynx  Neck: normal jugular venous pulsations and no hepatojugular reflux; brisk carotid pulses without delay; old healed bilateral endarterectomy scars; faint bilateral carotid bruits, left louder than right  Chest: clear to auscultation, no signs of consolidation by percussion or palpation, normal fremitus, symmetrical and full respiratory excursions  Cardiovascular: normal position and quality of the apical impulse, regular rhythm, normal first and second heart sounds, no rubs or gallops, early systolic murmur aortic focus, 2/6 in intensity  Abdomen: no tenderness or distention, no masses by palpation, no abnormal pulsatility or arterial bruits, normal bowel sounds, no hepatosplenomegaly  Extremities: no clubbing, cyanosis or edema; 2+ radial, ulnar and brachial pulses bilaterally; 2+ right femoral, posterior tibial and dorsalis pedis pulses; 2+ left femoral, posterior tibial and dorsalis pedis pulses; no subclavian or femoral bruits  Neurological: grossly nonfocal   EKG: Sinus rhythm with first degree AV block  Total cholesterol 122, triglycerides 48, HDL 46, LDL 66, normal liver function tests  BMET    Component Value Date/Time   NA 139 05/12/2011 0400   K 3.8 05/12/2011 0400   CL 104 05/12/2011 0400   CO2 26 05/12/2011 0400   GLUCOSE 113* 05/12/2011 0400   BUN 10 05/12/2011 0400   CREATININE 0.63 05/12/2011 0400   CALCIUM 9.0 05/12/2011  0400   GFRNONAA 81* 05/12/2011 0400   GFRAA >90 05/12/2011 0400     ASSESSMENT AND PLAN  CAD (coronary artery disease)  Asymptomatic 80% stenosis in the posterior lateral ventricular branch of the right coronary artery, continue medical therapy   Carotid artery stenosis, asymptomatic  Status post bilateral carotid endarterectomy, without evidence of restenosis, followed by Dr. Arbie Cookey   First degree AV block  Not clinically important, no other signs of meaningful AV node conduction disease. Tolerating high-dose beta blocker   Hyperlipidemia  Excellent lipid parameters. We'll get updated results from Dr. Renne Crigler   Right renal artery stenosis  Kidney size remains normal, renal function remains normal and her blood pressure is well controlled. We can defer revascularization. Peak velocity of most recent study  530 cm/second. If there is evidence of impending arterial occlusion, I would refer to Dr. Allyson Sabal or Dr. Arbie Cookey to discuss angioplasty/stent.     Orders Placed This Encounter  Procedures  . EKG 12-Lead   Meds ordered this encounter  Medications  . metoprolol (LOPRESSOR) 100 MG tablet    Sig: Take 1 tablet by mouth 2 (two) times daily.    Junious Silk, MD, [facility] [phone] office [phone] pager

## 2014-03-21 NOTE — Telephone Encounter (Signed)
Ronnell Guadalajara informed of med

## 2014-05-05 DIAGNOSIS — Z23 Encounter for immunization: Secondary | ICD-10-CM | POA: Diagnosis not present

## 2014-05-14 DIAGNOSIS — M5431 Sciatica, right side: Secondary | ICD-10-CM | POA: Diagnosis not present

## 2014-05-14 DIAGNOSIS — M5430 Sciatica, unspecified side: Secondary | ICD-10-CM | POA: Diagnosis not present

## 2014-05-14 DIAGNOSIS — M8580 Other specified disorders of bone density and structure, unspecified site: Secondary | ICD-10-CM | POA: Diagnosis not present

## 2014-05-15 DIAGNOSIS — M5136 Other intervertebral disc degeneration, lumbar region: Secondary | ICD-10-CM | POA: Diagnosis not present

## 2014-05-15 DIAGNOSIS — M5431 Sciatica, right side: Secondary | ICD-10-CM | POA: Diagnosis not present

## 2014-05-15 DIAGNOSIS — M9903 Segmental and somatic dysfunction of lumbar region: Secondary | ICD-10-CM | POA: Diagnosis not present

## 2014-05-15 DIAGNOSIS — I10 Essential (primary) hypertension: Secondary | ICD-10-CM | POA: Diagnosis not present

## 2014-05-21 DIAGNOSIS — M5136 Other intervertebral disc degeneration, lumbar region: Secondary | ICD-10-CM | POA: Diagnosis not present

## 2014-05-21 DIAGNOSIS — I10 Essential (primary) hypertension: Secondary | ICD-10-CM | POA: Diagnosis not present

## 2014-05-21 DIAGNOSIS — M9903 Segmental and somatic dysfunction of lumbar region: Secondary | ICD-10-CM | POA: Diagnosis not present

## 2014-05-21 DIAGNOSIS — M5431 Sciatica, right side: Secondary | ICD-10-CM | POA: Diagnosis not present

## 2014-05-22 DIAGNOSIS — M9903 Segmental and somatic dysfunction of lumbar region: Secondary | ICD-10-CM | POA: Diagnosis not present

## 2014-05-22 DIAGNOSIS — I10 Essential (primary) hypertension: Secondary | ICD-10-CM | POA: Diagnosis not present

## 2014-05-22 DIAGNOSIS — M5431 Sciatica, right side: Secondary | ICD-10-CM | POA: Diagnosis not present

## 2014-05-22 DIAGNOSIS — M5136 Other intervertebral disc degeneration, lumbar region: Secondary | ICD-10-CM | POA: Diagnosis not present

## 2014-05-24 DIAGNOSIS — M9903 Segmental and somatic dysfunction of lumbar region: Secondary | ICD-10-CM | POA: Diagnosis not present

## 2014-05-24 DIAGNOSIS — M5431 Sciatica, right side: Secondary | ICD-10-CM | POA: Diagnosis not present

## 2014-05-24 DIAGNOSIS — M5136 Other intervertebral disc degeneration, lumbar region: Secondary | ICD-10-CM | POA: Diagnosis not present

## 2014-05-24 DIAGNOSIS — I10 Essential (primary) hypertension: Secondary | ICD-10-CM | POA: Diagnosis not present

## 2014-05-28 DIAGNOSIS — I10 Essential (primary) hypertension: Secondary | ICD-10-CM | POA: Diagnosis not present

## 2014-05-28 DIAGNOSIS — M5136 Other intervertebral disc degeneration, lumbar region: Secondary | ICD-10-CM | POA: Diagnosis not present

## 2014-05-28 DIAGNOSIS — M9903 Segmental and somatic dysfunction of lumbar region: Secondary | ICD-10-CM | POA: Diagnosis not present

## 2014-05-28 DIAGNOSIS — M5431 Sciatica, right side: Secondary | ICD-10-CM | POA: Diagnosis not present

## 2014-05-29 DIAGNOSIS — M9903 Segmental and somatic dysfunction of lumbar region: Secondary | ICD-10-CM | POA: Diagnosis not present

## 2014-05-29 DIAGNOSIS — M5431 Sciatica, right side: Secondary | ICD-10-CM | POA: Diagnosis not present

## 2014-05-29 DIAGNOSIS — M5136 Other intervertebral disc degeneration, lumbar region: Secondary | ICD-10-CM | POA: Diagnosis not present

## 2014-05-29 DIAGNOSIS — I10 Essential (primary) hypertension: Secondary | ICD-10-CM | POA: Diagnosis not present

## 2014-05-31 DIAGNOSIS — M5136 Other intervertebral disc degeneration, lumbar region: Secondary | ICD-10-CM | POA: Diagnosis not present

## 2014-05-31 DIAGNOSIS — M9903 Segmental and somatic dysfunction of lumbar region: Secondary | ICD-10-CM | POA: Diagnosis not present

## 2014-05-31 DIAGNOSIS — M5431 Sciatica, right side: Secondary | ICD-10-CM | POA: Diagnosis not present

## 2014-05-31 DIAGNOSIS — I10 Essential (primary) hypertension: Secondary | ICD-10-CM | POA: Diagnosis not present

## 2014-06-04 DIAGNOSIS — M5431 Sciatica, right side: Secondary | ICD-10-CM | POA: Diagnosis not present

## 2014-06-04 DIAGNOSIS — M5136 Other intervertebral disc degeneration, lumbar region: Secondary | ICD-10-CM | POA: Diagnosis not present

## 2014-06-04 DIAGNOSIS — I10 Essential (primary) hypertension: Secondary | ICD-10-CM | POA: Diagnosis not present

## 2014-06-04 DIAGNOSIS — M9903 Segmental and somatic dysfunction of lumbar region: Secondary | ICD-10-CM | POA: Diagnosis not present

## 2014-06-05 DIAGNOSIS — M5431 Sciatica, right side: Secondary | ICD-10-CM | POA: Diagnosis not present

## 2014-06-05 DIAGNOSIS — M5136 Other intervertebral disc degeneration, lumbar region: Secondary | ICD-10-CM | POA: Diagnosis not present

## 2014-06-05 DIAGNOSIS — M9903 Segmental and somatic dysfunction of lumbar region: Secondary | ICD-10-CM | POA: Diagnosis not present

## 2014-06-05 DIAGNOSIS — M461 Sacroiliitis, not elsewhere classified: Secondary | ICD-10-CM | POA: Diagnosis not present

## 2014-06-05 DIAGNOSIS — I10 Essential (primary) hypertension: Secondary | ICD-10-CM | POA: Diagnosis not present

## 2014-06-06 DIAGNOSIS — I10 Essential (primary) hypertension: Secondary | ICD-10-CM | POA: Diagnosis not present

## 2014-06-06 DIAGNOSIS — M5431 Sciatica, right side: Secondary | ICD-10-CM | POA: Diagnosis not present

## 2014-06-06 DIAGNOSIS — M9903 Segmental and somatic dysfunction of lumbar region: Secondary | ICD-10-CM | POA: Diagnosis not present

## 2014-06-06 DIAGNOSIS — M5136 Other intervertebral disc degeneration, lumbar region: Secondary | ICD-10-CM | POA: Diagnosis not present

## 2014-06-11 DIAGNOSIS — M5136 Other intervertebral disc degeneration, lumbar region: Secondary | ICD-10-CM | POA: Diagnosis not present

## 2014-06-11 DIAGNOSIS — M5431 Sciatica, right side: Secondary | ICD-10-CM | POA: Diagnosis not present

## 2014-06-11 DIAGNOSIS — I10 Essential (primary) hypertension: Secondary | ICD-10-CM | POA: Diagnosis not present

## 2014-06-11 DIAGNOSIS — M9903 Segmental and somatic dysfunction of lumbar region: Secondary | ICD-10-CM | POA: Diagnosis not present

## 2014-06-12 DIAGNOSIS — M5431 Sciatica, right side: Secondary | ICD-10-CM | POA: Diagnosis not present

## 2014-06-12 DIAGNOSIS — M5136 Other intervertebral disc degeneration, lumbar region: Secondary | ICD-10-CM | POA: Diagnosis not present

## 2014-06-12 DIAGNOSIS — I10 Essential (primary) hypertension: Secondary | ICD-10-CM | POA: Diagnosis not present

## 2014-06-12 DIAGNOSIS — M9903 Segmental and somatic dysfunction of lumbar region: Secondary | ICD-10-CM | POA: Diagnosis not present

## 2014-06-13 DIAGNOSIS — M5136 Other intervertebral disc degeneration, lumbar region: Secondary | ICD-10-CM | POA: Diagnosis not present

## 2014-06-13 DIAGNOSIS — M5431 Sciatica, right side: Secondary | ICD-10-CM | POA: Diagnosis not present

## 2014-06-13 DIAGNOSIS — I10 Essential (primary) hypertension: Secondary | ICD-10-CM | POA: Diagnosis not present

## 2014-06-13 DIAGNOSIS — M9903 Segmental and somatic dysfunction of lumbar region: Secondary | ICD-10-CM | POA: Diagnosis not present

## 2014-06-18 DIAGNOSIS — M5431 Sciatica, right side: Secondary | ICD-10-CM | POA: Diagnosis not present

## 2014-06-18 DIAGNOSIS — M9903 Segmental and somatic dysfunction of lumbar region: Secondary | ICD-10-CM | POA: Diagnosis not present

## 2014-06-18 DIAGNOSIS — M5136 Other intervertebral disc degeneration, lumbar region: Secondary | ICD-10-CM | POA: Diagnosis not present

## 2014-06-18 DIAGNOSIS — I10 Essential (primary) hypertension: Secondary | ICD-10-CM | POA: Diagnosis not present

## 2014-06-21 DIAGNOSIS — M5136 Other intervertebral disc degeneration, lumbar region: Secondary | ICD-10-CM | POA: Diagnosis not present

## 2014-06-21 DIAGNOSIS — M5431 Sciatica, right side: Secondary | ICD-10-CM | POA: Diagnosis not present

## 2014-06-21 DIAGNOSIS — I10 Essential (primary) hypertension: Secondary | ICD-10-CM | POA: Diagnosis not present

## 2014-06-21 DIAGNOSIS — M9903 Segmental and somatic dysfunction of lumbar region: Secondary | ICD-10-CM | POA: Diagnosis not present

## 2014-06-25 DIAGNOSIS — M9903 Segmental and somatic dysfunction of lumbar region: Secondary | ICD-10-CM | POA: Diagnosis not present

## 2014-06-25 DIAGNOSIS — I10 Essential (primary) hypertension: Secondary | ICD-10-CM | POA: Diagnosis not present

## 2014-06-25 DIAGNOSIS — M5136 Other intervertebral disc degeneration, lumbar region: Secondary | ICD-10-CM | POA: Diagnosis not present

## 2014-06-25 DIAGNOSIS — M5431 Sciatica, right side: Secondary | ICD-10-CM | POA: Diagnosis not present

## 2014-06-28 DIAGNOSIS — M5136 Other intervertebral disc degeneration, lumbar region: Secondary | ICD-10-CM | POA: Diagnosis not present

## 2014-06-28 DIAGNOSIS — M9903 Segmental and somatic dysfunction of lumbar region: Secondary | ICD-10-CM | POA: Diagnosis not present

## 2014-06-28 DIAGNOSIS — I10 Essential (primary) hypertension: Secondary | ICD-10-CM | POA: Diagnosis not present

## 2014-06-28 DIAGNOSIS — M5431 Sciatica, right side: Secondary | ICD-10-CM | POA: Diagnosis not present

## 2014-07-02 DIAGNOSIS — M5431 Sciatica, right side: Secondary | ICD-10-CM | POA: Diagnosis not present

## 2014-07-02 DIAGNOSIS — M9903 Segmental and somatic dysfunction of lumbar region: Secondary | ICD-10-CM | POA: Diagnosis not present

## 2014-07-02 DIAGNOSIS — M5136 Other intervertebral disc degeneration, lumbar region: Secondary | ICD-10-CM | POA: Diagnosis not present

## 2014-07-02 DIAGNOSIS — I10 Essential (primary) hypertension: Secondary | ICD-10-CM | POA: Diagnosis not present

## 2014-07-05 DIAGNOSIS — M9903 Segmental and somatic dysfunction of lumbar region: Secondary | ICD-10-CM | POA: Diagnosis not present

## 2014-07-05 DIAGNOSIS — M5136 Other intervertebral disc degeneration, lumbar region: Secondary | ICD-10-CM | POA: Diagnosis not present

## 2014-07-05 DIAGNOSIS — I10 Essential (primary) hypertension: Secondary | ICD-10-CM | POA: Diagnosis not present

## 2014-07-05 DIAGNOSIS — M5431 Sciatica, right side: Secondary | ICD-10-CM | POA: Diagnosis not present

## 2014-07-09 DIAGNOSIS — M9903 Segmental and somatic dysfunction of lumbar region: Secondary | ICD-10-CM | POA: Diagnosis not present

## 2014-07-09 DIAGNOSIS — M5431 Sciatica, right side: Secondary | ICD-10-CM | POA: Diagnosis not present

## 2014-07-09 DIAGNOSIS — M5136 Other intervertebral disc degeneration, lumbar region: Secondary | ICD-10-CM | POA: Diagnosis not present

## 2014-07-09 DIAGNOSIS — I10 Essential (primary) hypertension: Secondary | ICD-10-CM | POA: Diagnosis not present

## 2014-07-23 DIAGNOSIS — M533 Sacrococcygeal disorders, not elsewhere classified: Secondary | ICD-10-CM | POA: Diagnosis not present

## 2014-08-07 DIAGNOSIS — M533 Sacrococcygeal disorders, not elsewhere classified: Secondary | ICD-10-CM | POA: Diagnosis not present

## 2014-08-20 DIAGNOSIS — M8580 Other specified disorders of bone density and structure, unspecified site: Secondary | ICD-10-CM | POA: Diagnosis not present

## 2014-08-20 DIAGNOSIS — E780 Pure hypercholesterolemia: Secondary | ICD-10-CM | POA: Diagnosis not present

## 2014-08-20 DIAGNOSIS — E213 Hyperparathyroidism, unspecified: Secondary | ICD-10-CM | POA: Diagnosis not present

## 2014-08-23 DIAGNOSIS — Z23 Encounter for immunization: Secondary | ICD-10-CM | POA: Diagnosis not present

## 2014-08-23 DIAGNOSIS — M8580 Other specified disorders of bone density and structure, unspecified site: Secondary | ICD-10-CM | POA: Diagnosis not present

## 2014-08-23 DIAGNOSIS — Z0000 Encounter for general adult medical examination without abnormal findings: Secondary | ICD-10-CM | POA: Diagnosis not present

## 2014-08-31 DIAGNOSIS — M7061 Trochanteric bursitis, right hip: Secondary | ICD-10-CM | POA: Diagnosis not present

## 2014-09-05 ENCOUNTER — Encounter (HOSPITAL_COMMUNITY): Payer: Self-pay | Admitting: Emergency Medicine

## 2014-09-05 ENCOUNTER — Emergency Department (HOSPITAL_COMMUNITY)
Admission: EM | Admit: 2014-09-05 | Discharge: 2014-09-05 | Disposition: A | Discharge status: Home / Self Care | Payer: Medicare Other | Attending: Emergency Medicine | Admitting: Emergency Medicine

## 2014-09-05 DIAGNOSIS — I2510 Atherosclerotic heart disease of native coronary artery without angina pectoris: Secondary | ICD-10-CM | POA: Diagnosis not present

## 2014-09-05 DIAGNOSIS — M549 Dorsalgia, unspecified: Secondary | ICD-10-CM | POA: Diagnosis not present

## 2014-09-05 DIAGNOSIS — Z9889 Other specified postprocedural states: Secondary | ICD-10-CM | POA: Insufficient documentation

## 2014-09-05 DIAGNOSIS — M25559 Pain in unspecified hip: Secondary | ICD-10-CM | POA: Diagnosis not present

## 2014-09-05 DIAGNOSIS — M545 Low back pain, unspecified: Secondary | ICD-10-CM

## 2014-09-05 DIAGNOSIS — Z862 Personal history of diseases of the blood and blood-forming organs and certain disorders involving the immune mechanism: Secondary | ICD-10-CM | POA: Insufficient documentation

## 2014-09-05 DIAGNOSIS — Z79899 Other long term (current) drug therapy: Secondary | ICD-10-CM | POA: Insufficient documentation

## 2014-09-05 DIAGNOSIS — R52 Pain, unspecified: Secondary | ICD-10-CM | POA: Diagnosis not present

## 2014-09-05 DIAGNOSIS — M25551 Pain in right hip: Secondary | ICD-10-CM | POA: Insufficient documentation

## 2014-09-05 HISTORY — DX: Atherosclerotic heart disease of native coronary artery without angina pectoris: I25.10

## 2014-09-05 LAB — URINALYSIS, ROUTINE W REFLEX MICROSCOPIC
Bilirubin Urine: NEGATIVE
Glucose, UA: NEGATIVE mg/dL
Hgb urine dipstick: NEGATIVE
Ketones, ur: NEGATIVE mg/dL
Leukocytes, UA: NEGATIVE
Nitrite: NEGATIVE
Protein, ur: NEGATIVE mg/dL
Specific Gravity, Urine: 1.015 (ref 1.005–1.030)
Urobilinogen, UA: 0.2 mg/dL (ref 0.0–1.0)
pH: 6.5 (ref 5.0–8.0)

## 2014-09-05 MED ORDER — TRAMADOL HCL 50 MG PO TABS: 50.0000 mg | ORAL_TABLET | Freq: Four times a day (QID) | ORAL | Status: DC | PRN

## 2014-09-05 MED ORDER — TRAMADOL HCL 50 MG PO TABS
50.0000 mg | ORAL_TABLET | Freq: Once | ORAL | Status: AC
  Administered 2014-09-05: 50 mg via ORAL
  Filled 2014-09-05: qty 1

## 2014-09-05 MED ORDER — NAPROXEN 500 MG PO TABS
250.0000 mg | ORAL_TABLET | Freq: Once | ORAL | Status: AC
  Administered 2014-09-05: 250 mg via ORAL
  Filled 2014-09-05: qty 1

## 2014-09-05 NOTE — ED Notes (Addendum)
[facility] EMS [facility] [phone], for lower to middle on right side back pain from no injury that woke pt up at 730am this morning from sleep, but pt state that she has had the back pain for about a month now.  Pt also having right hip pain, "bursitis for about week now".  Pt was insisted to come be evaluated by the staff.  EMS Vital signs: 142/84, 58HR, 18RR

## 2014-09-05 NOTE — ED Notes (Addendum)
[facility]. [facility] B[facility]. Awaiting phone call.   [facility] and Angela Burkeerese RN is out of office. [facility] contacted.

## 2014-09-05 NOTE — Discharge Instructions (Signed)
Back Pain, Adult Low back pain is very common. About 1 in 5 people have back pain.The cause of low back pain is rarely dangerous. The pain often gets better over time.About half of people with a sudden onset of back pain feel better in just 2 weeks. About 8 in 10 people feel better by 6 weeks.  CAUSES Some common causes of back pain include:  Strain of the muscles or ligaments supporting the spine.  Wear and tear (degeneration) of the spinal discs.  Arthritis.  Direct injury to the back. DIAGNOSIS Most of the time, the direct cause of low back pain is not known.However, back pain can be treated effectively even when the exact cause of the pain is unknown.Answering your caregiver's questions about your overall health and symptoms is one of the most accurate ways to make sure the cause of your pain is not dangerous. If your caregiver needs more information, he or she may order lab work or imaging tests (X-rays or MRIs).However, even if imaging tests show changes in your back, this usually does not require surgery. HOME CARE INSTRUCTIONS For many people, back pain returns.Since low back pain is rarely dangerous, it is often a condition that people can learn to manageon their own.   Remain active. It is stressful on the back to sit or stand in one place. Do not sit, drive, or stand in one place for more than 30 minutes at a time. Take short walks on level surfaces as soon as pain allows.Try to increase the length of time you walk each day.  Do not stay in bed.Resting more than 1 or 2 days can delay your recovery.  Do not avoid exercise or work.Your body is made to move.It is not dangerous to be active, even though your back may hurt.Your back will likely heal faster if you return to being active before your pain is gone.  Pay attention to your body when you bend and lift. Many people have less discomfortwhen lifting if they bend their knees, keep the load close to their bodies,and  avoid twisting. Often, the most comfortable positions are those that put less stress on your recovering back.  Find a comfortable position to sleep. Use a firm mattress and lie on your side with your knees slightly bent. If you lie on your back, put a pillow under your knees.  Only take over-the-counter or prescription medicines as directed by your caregiver. Over-the-counter medicines to reduce pain and inflammation are often the most helpful.Your caregiver may prescribe muscle relaxant drugs.These medicines help dull your pain so you can more quickly return to your normal activities and healthy exercise.  Put ice on the injured area.  Put ice in a plastic bag.  Place a towel between your skin and the bag.  Leave the ice on for 15-20 minutes, 03-04 times a day for the first 2 to 3 days. After that, ice and heat may be alternated to reduce pain and spasms.  Ask your caregiver about trying back exercises and gentle massage. This may be of some benefit.  Avoid feeling anxious or stressed.Stress increases muscle tension and can worsen back pain.It is important to recognize when you are anxious or stressed and learn ways to manage it.Exercise is a great option. SEEK MEDICAL CARE IF:  You have pain that is not relieved with rest or medicine.  You have pain that does not improve in 1 week.  You have new symptoms.  You are generally not feeling well. SEEK   IMMEDIATE MEDICAL CARE IF:   You have pain that radiates from your back into your legs.  You develop new bowel or bladder control problems.  You have unusual weakness or numbness in your arms or legs.  You develop nausea or vomiting.  You develop abdominal pain.  You feel faint. Document Released: 07/06/2005 Document Revised: 01/05/2012 Document Reviewed: 11/07/2013 [employer] Patient Information 2015 [employer]. This information is not intended to replace advice given to you by your health care provider. Make sure you  discuss any questions you have with your health care provider. Hip Pain Your hip is the joint between your upper legs and your lower pelvis. The bones, cartilage, tendons, and muscles of your hip joint perform a lot of work each day supporting your body weight and allowing you to move around. Hip pain can range from a minor ache to severe pain in one or both of your hips. Pain may be felt on the inside of the hip joint near the groin, or the outside near the buttocks and upper thigh. You may have swelling or stiffness as well.  HOME CARE INSTRUCTIONS   Take medicines only as directed by your health care provider.  Apply ice to the injured area:  Put ice in a plastic bag.  Place a towel between your skin and the bag.  Leave the ice on for 15-20 minutes at a time, 3-4 times a day.  Keep your leg raised (elevated) when possible to lessen swelling.  Avoid activities that cause pain.  Follow specific exercises as directed by your health care provider.  Sleep with a pillow between your legs on your most comfortable side.  Record how often you have hip pain, the location of the pain, and what it feels like. SEEK MEDICAL CARE IF:   You are unable to put weight on your leg.  Your hip is red or swollen or very tender to touch.  Your pain or swelling continues or worsens after 1 week.  You have increasing difficulty walking.  You have a fever. SEEK IMMEDIATE MEDICAL CARE IF:   You have fallen.  You have a sudden increase in pain and swelling in your hip. MAKE SURE YOU:   Understand these instructions.  Will watch your condition.  Will get help right away if you are not doing well or get worse. Document Released: 12/24/2009 Document Revised: 11/20/2013 Document Reviewed: 03/02/2013 [employer] Patient Information 2015 [employer]. This information is not intended to replace advice given to you by your health care provider. Make sure you discuss any questions you have with your  health care provider.  

## 2014-09-05 NOTE — ED Notes (Signed)
Bed: WA18 Expected date:  Expected time:  Means of arrival:  Comments: EMS-back pain 

## 2014-09-05 NOTE — ED Provider Notes (Signed)
CSN: [id number]     Arrival date & time 09/05/14  7829 History   First MD Initiated Contact with Patient 09/05/14 0940     Chief Complaint  Patient presents with  . Back Pain  . right hip pain      (Consider location/radiation/quality/duration/timing/severity/associated sxs/prior Treatment) Patient is a 81 y.o. female presenting with back pain and hip pain.  Back Pain Location:  Lumbar spine Quality:  Aching Radiates to:  Does not radiate Pain severity:  Severe Onset quality:  Gradual Duration:  1 month Timing:  Constant Progression:  Unchanged Chronicity:  New Context: not falling and not recent injury   Relieved by: aleve provides mild relief. Worsened by:  Movement Associated symptoms: no abdominal pain, no bladder incontinence, no bowel incontinence, no dysuria, no fever, no numbness, no paresthesias, no perianal numbness and no weakness   Hip Pain This is a new problem. Episode onset: 1 month ago. The problem occurs constantly. The problem has been gradually worsening. Pertinent negatives include no abdominal pain. Exacerbated by: movement, walking. Treatments tried: saw her orthopedist a few days ago and given an injection which didn't help.    Past Medical History  Diagnosis Date  . High blood pressure   . Chest tightness   . Palpitations   . Anemia   . Leg pain     with walking  . Carotid arterial disease     asymptomatic   . Coronary artery disease    Past Surgical History  Procedure Laterality Date  . Eye surgery  2011    Cataract surgery Bilateral  . Eye surgery  2011    Bilateral eyelid surgery for ptosis  . Cardiac catheterization  07/15/04    noncritical CAD,80% PLA treated medically. EF% 60. no RAS.  [name] Renal artery duplex  02/23/13    ABDOMINAL AORTA:<50% DIAMETER REDUCTION. RIGHT RENAL ARTERY: 60-99% DIAMETER REDUCTION.[name] LEFT RENAL ARTERY:1-59% DIAMETER REDUCTION.  . Carotid endarterectomy      RIGHT=05/11/11, LEFT=03/30/11   Family History    Problem Relation Age of Onset  . Lung disease Mother   . Heart disease Father   . Cancer Sister     liver cancer  . Heart disease Brother    History  Substance Use Topics  . Smoking status: Never Smoker   . Smokeless tobacco: Never Used  . Alcohol Use: No   OB History    No data available     Review of Systems  Constitutional: Negative for fever.  Gastrointestinal: Negative for abdominal pain and bowel incontinence.  Genitourinary: Negative for bladder incontinence and dysuria.  Musculoskeletal: Positive for back pain.  Neurological: Negative for weakness, numbness and paresthesias.  All other systems reviewed and are negative.     Allergies  Cardura and Lotensin  Home Medications   Prior to Admission medications   Medication Sig Start Date End Date Taking? Authorizing Provider  amLODipine (NORVASC) 5 MG tablet Take 1.5 tablets (7.5 mg total) by mouth daily. 06/12/13  Yes Mihai Croitoru, MD  Cholecalciferol (VITAMIN D PO) Take 2,000 mg by mouth daily.     Yes Historical Provider, MD  isosorbide mononitrate (IMDUR) 60 MG 24 hr tablet Take 60 mg by mouth daily.    Yes Historical Provider, MD  losartan-hydrochlorothiazide (HYZAAR) 100-25 MG per tablet Take 1 tablet by mouth daily.  09/14/13  Yes Historical Provider, MD  MAGNESIUM ASPARTATE PO Take 1 tablet by mouth 2 (two) times a week.    Yes Historical Provider, MD  metoprolol (  LOPRESSOR) 100 MG tablet Take 1 tablet by mouth 2 (two) times daily. 03/07/14  Yes Historical Provider, MD  NAPROXEN PO Take 200 mg by mouth daily as needed (pain).    Yes Historical Provider, MD  PREMARIN vaginal cream Place 1 Applicatorful vaginally 2 (two) times a week. Monday and Thursday 06/20/13  Yes Historical Provider, MD  rOPINIRole (REQUIP) 1 MG tablet Take 1 mg by mouth 2 (two) times daily.     Yes Historical Provider, MD  rosuvastatin (CRESTOR) 10 MG tablet Take 10 mg by mouth daily.     Yes Historical Provider, MD   triamterene-hydrochlorothiazide (MAXZIDE-25) 37.5-25 MG per tablet Take 1 tablet by mouth daily. Patient not taking: Reported on 09/05/2014 08/16/13   Runell GessJonathan J Berry, MD   BP 139/64 mmHg  Pulse 64  Temp(Src) 97.7 F (36.5 C) (Oral)  Resp 18  SpO2 100% Physical Exam  Constitutional: She is oriented to person, place, and time. She appears well-developed and well-nourished. No distress.  HENT:  Head: Normocephalic and atraumatic.  Eyes: Conjunctivae are normal. No scleral icterus.  Neck: Neck supple.  Cardiovascular: Normal rate and intact distal pulses.   Pulmonary/Chest: Effort normal. No stridor. No respiratory distress.  Abdominal: Normal appearance. She exhibits no distension.  Musculoskeletal:       Right hip: She exhibits tenderness. She exhibits normal range of motion (no pain with ROM), normal strength, no swelling, no crepitus, no deformity and no laceration.       Lumbar back: She exhibits no bony tenderness.       Back:  Neurological: She is alert and oriented to person, place, and time.  Skin: Skin is warm and dry. No rash noted.  Psychiatric: She has a normal mood and affect. Her behavior is normal.  Nursing note and vitals reviewed.   ED Course  Procedures (including critical care time) Labs Review Labs Reviewed  URINALYSIS, ROUTINE W REFLEX MICROSCOPIC    Imaging Review No results found.   EKG Interpretation None      MDM   Final diagnoses:  Right-sided low back pain without sciatica  Right hip pain    81 yo female with right low back and right hip pain.  She states she was diagnosed with bursitis by her orthopedist.  No fevers, no weakness.  No red flag symptoms to suggest acute spinal pathology.  Exam not consistent with septic arthritis.  She has reportedly already had xrays done regarding this pain.  No injuries to suggest occult hip fracture.  Will treat with tramadol.  Pt able to get up and ambulate to bathroom after tramadol, but still had  pain.  Will give her a dose of her naproxen as well.  She appears stable for continued outpatient treatment.   Candyce ChurnJohn David Kaydin Labo III, MD 09/05/14 [phone]

## 2014-09-07 DIAGNOSIS — M79604 Pain in right leg: Secondary | ICD-10-CM | POA: Diagnosis not present

## 2014-09-10 ENCOUNTER — Inpatient Hospital Stay (HOSPITAL_COMMUNITY): Admission: RE | Admit: 2014-09-10 | Payer: Medicare Other | Source: Ambulatory Visit

## 2014-09-11 ENCOUNTER — Telehealth (HOSPITAL_COMMUNITY): Payer: Self-pay | Admitting: *Deleted

## 2014-09-15 DIAGNOSIS — M47816 Spondylosis without myelopathy or radiculopathy, lumbar region: Secondary | ICD-10-CM | POA: Diagnosis not present

## 2014-09-19 DIAGNOSIS — S32000A Wedge compression fracture of unspecified lumbar vertebra, initial encounter for closed fracture: Secondary | ICD-10-CM | POA: Diagnosis not present

## 2014-09-19 DIAGNOSIS — M4806 Spinal stenosis, lumbar region: Secondary | ICD-10-CM | POA: Diagnosis not present

## 2014-09-24 ENCOUNTER — Emergency Department (HOSPITAL_COMMUNITY)
Admission: EM | Admit: 2014-09-24 | Discharge: 2014-09-24 | Disposition: A | Discharge status: Home / Self Care | Payer: Medicare Other | Attending: Emergency Medicine | Admitting: Emergency Medicine

## 2014-09-24 ENCOUNTER — Encounter (HOSPITAL_COMMUNITY): Payer: Self-pay | Admitting: Emergency Medicine

## 2014-09-24 ENCOUNTER — Emergency Department (HOSPITAL_COMMUNITY): Payer: Medicare Other

## 2014-09-24 DIAGNOSIS — Z79899 Other long term (current) drug therapy: Secondary | ICD-10-CM | POA: Insufficient documentation

## 2014-09-24 DIAGNOSIS — R809 Proteinuria, unspecified: Secondary | ICD-10-CM

## 2014-09-24 DIAGNOSIS — Z862 Personal history of diseases of the blood and blood-forming organs and certain disorders involving the immune mechanism: Secondary | ICD-10-CM | POA: Insufficient documentation

## 2014-09-24 DIAGNOSIS — I2510 Atherosclerotic heart disease of native coronary artery without angina pectoris: Secondary | ICD-10-CM | POA: Insufficient documentation

## 2014-09-24 DIAGNOSIS — K5900 Constipation, unspecified: Secondary | ICD-10-CM | POA: Diagnosis not present

## 2014-09-24 DIAGNOSIS — R0602 Shortness of breath: Secondary | ICD-10-CM | POA: Insufficient documentation

## 2014-09-24 DIAGNOSIS — R112 Nausea with vomiting, unspecified: Secondary | ICD-10-CM | POA: Diagnosis not present

## 2014-09-24 DIAGNOSIS — R000 Tachycardia, unspecified: Secondary | ICD-10-CM | POA: Insufficient documentation

## 2014-09-24 DIAGNOSIS — R1111 Vomiting without nausea: Secondary | ICD-10-CM | POA: Diagnosis not present

## 2014-09-24 DIAGNOSIS — E860 Dehydration: Secondary | ICD-10-CM | POA: Diagnosis not present

## 2014-09-24 DIAGNOSIS — R0789 Other chest pain: Secondary | ICD-10-CM | POA: Diagnosis not present

## 2014-09-24 LAB — CBC
HCT: 40.4 % (ref 36.0–46.0)
Hemoglobin: 13.4 g/dL (ref 12.0–15.0)
MCH: 29.1 pg (ref 26.0–34.0)
MCHC: 33.2 g/dL (ref 30.0–36.0)
MCV: 87.6 fL (ref 78.0–100.0)
Platelets: 322 10*3/uL (ref 150–400)
RBC: 4.61 MIL/uL (ref 3.87–5.11)
RDW: 13.5 % (ref 11.5–15.5)
WBC: 8.8 10*3/uL (ref 4.0–10.5)

## 2014-09-24 LAB — COMPREHENSIVE METABOLIC PANEL WITH GFR
ALT: 14 U/L (ref 0–35)
AST: 17 U/L (ref 0–37)
Albumin: 3.1 g/dL — ABNORMAL LOW (ref 3.5–5.2)
Alkaline Phosphatase: 114 U/L (ref 39–117)
Anion gap: 15 (ref 5–15)
BUN: 18 mg/dL (ref 6–23)
CO2: 23 mmol/L (ref 19–32)
Calcium: 9.3 mg/dL (ref 8.4–10.5)
Chloride: 102 mmol/L (ref 96–112)
Creatinine, Ser: 0.97 mg/dL (ref 0.50–1.10)
GFR calc Af Amer: 60 mL/min — ABNORMAL LOW
GFR calc non Af Amer: 51 mL/min — ABNORMAL LOW
Glucose, Bld: 75 mg/dL (ref 70–99)
Potassium: 3.6 mmol/L (ref 3.5–5.1)
Sodium: 140 mmol/L (ref 135–145)
Total Bilirubin: 1.1 mg/dL (ref 0.3–1.2)
Total Protein: 6.3 g/dL (ref 6.0–8.3)

## 2014-09-24 LAB — URINALYSIS, ROUTINE W REFLEX MICROSCOPIC
Bilirubin Urine: NEGATIVE
Glucose, UA: NEGATIVE mg/dL
Hgb urine dipstick: NEGATIVE
Ketones, ur: >80 mg/dL — AB
Leukocytes, UA: NEGATIVE
Nitrite: NEGATIVE
Protein, ur: 30 mg/dL — AB
Specific Gravity, Urine: 1.017 (ref 1.005–1.030)
Urobilinogen, UA: 0.2 mg/dL (ref 0.0–1.0)
pH: 6 (ref 5.0–8.0)

## 2014-09-24 LAB — TROPONIN I: Troponin I: <0.03 ng/mL

## 2014-09-24 LAB — URINE MICROSCOPIC-ADD ON

## 2014-09-24 MED ORDER — ONDANSETRON 4 MG PO TBDP: 4.0000 mg | ORAL_TABLET | Freq: Three times a day (TID) | ORAL | Status: DC | PRN

## 2014-09-24 MED ORDER — SODIUM CHLORIDE 0.9 % IV BOLUS (SEPSIS)
500.0000 mL | Freq: Once | INTRAVENOUS | Status: AC
  Administered 2014-09-24: 500 mL via INTRAVENOUS

## 2014-09-24 MED ORDER — SODIUM CHLORIDE 0.9 % IV SOLN
Freq: Once | INTRAVENOUS | Status: AC
  Administered 2014-09-24: 13:00:00 via INTRAVENOUS

## 2014-09-24 MED ORDER — AMLODIPINE BESYLATE 5 MG PO TABS
10.0000 mg | ORAL_TABLET | Freq: Every morning | ORAL | Status: DC
  Administered 2014-09-24: 10 mg via ORAL
  Filled 2014-09-24: qty 2

## 2014-09-24 MED ORDER — SODIUM CHLORIDE 0.9 % IV SOLN: Freq: Once | INTRAVENOUS | Status: AC

## 2014-09-24 MED ORDER — METOPROLOL TARTRATE 25 MG PO TABS
100.0000 mg | ORAL_TABLET | Freq: Two times a day (BID) | ORAL | Status: DC
  Administered 2014-09-24: 100 mg via ORAL
  Filled 2014-09-24: qty 4

## 2014-09-24 MED ORDER — ROPINIROLE HCL 1 MG PO TABS
1.0000 mg | ORAL_TABLET | Freq: Two times a day (BID) | ORAL | Status: DC
  Administered 2014-09-24: 1 mg via ORAL
  Filled 2014-09-24 (×2): qty 1

## 2014-09-24 NOTE — Discharge Instructions (Signed)
Dehydration, Adult Dehydration is when you lose more fluids from the body than you take in. Vital organs like the kidneys, brain, and heart cannot function without a proper amount of fluids and salt. Any loss of fluids from the body can cause dehydration.  CAUSES   Vomiting.  Diarrhea.  Excessive sweating.  Excessive urine output.  Fever. SYMPTOMS  Mild dehydration  Thirst.  Dry lips.  Slightly dry mouth. Moderate dehydration  Very dry mouth.  Sunken eyes.  Skin does not bounce back quickly when lightly pinched and released.  Dark urine and decreased urine production.  Decreased tear production.  Headache. Severe dehydration  Very dry mouth.  Extreme thirst.  Rapid, weak pulse (more than 100 beats per minute at rest).  Cold hands and feet.  Not able to sweat in spite of heat and temperature.  Rapid breathing.  Blue lips.  Confusion and lethargy.  Difficulty being awakened.  Minimal urine production.  No tears. DIAGNOSIS  Your caregiver will diagnose dehydration based on your symptoms and your exam. Blood and urine tests will help confirm the diagnosis. The diagnostic evaluation should also identify the cause of dehydration. TREATMENT  Treatment of mild or moderate dehydration can often be done at home by increasing the amount of fluids that you drink. It is best to drink small amounts of fluid more often. Drinking too much at one time can make vomiting worse. Refer to the home care instructions below. Severe dehydration needs to be treated at the hospital where you will probably be given intravenous (IV) fluids that contain water and electrolytes. HOME CARE INSTRUCTIONS   Ask your caregiver about specific rehydration instructions.  Drink enough fluids to keep your urine clear or pale yellow.  Drink small amounts frequently if you have nausea and vomiting.  Eat as you normally do.  Avoid:  Foods or drinks high in sugar.  Carbonated  drinks.  Juice.  Extremely hot or cold fluids.  Drinks with caffeine.  Fatty, greasy foods.  Alcohol.  Tobacco.  Overeating.  Gelatin desserts.  Wash your hands well to avoid spreading bacteria and viruses.  Only take over-the-counter or prescription medicines for pain, discomfort, or fever as directed by your caregiver.  Ask your caregiver if you should continue all prescribed and over-the-counter medicines.  Keep all follow-up appointments with your caregiver. SEEK MEDICAL CARE IF:  You have abdominal pain and it increases or stays in one area (localizes).  You have a rash, stiff neck, or severe headache.  You are irritable, sleepy, or difficult to awaken.  You are weak, dizzy, or extremely thirsty. SEEK IMMEDIATE MEDICAL CARE IF:   You are unable to keep fluids down or you get worse despite treatment.  You have frequent episodes of vomiting or diarrhea.  You have blood or green matter (bile) in your vomit.  You have blood in your stool or your stool looks black and tarry.  You have not urinated in 6 to 8 hours, or you have only urinated a small amount of very dark urine.  You have a fever.  You faint. MAKE SURE YOU:   Understand these instructions.  Will watch your condition.  Will get help right away if you are not doing well or get worse. Document Released: 07/06/2005 Document Revised: 09/28/2011 Document Reviewed: 02/23/2011 [employer] Patient Information 2015 [employer]. This information is not intended to replace advice given to you by your health care provider. Make sure you discuss any questions you have with your health care   provider.  

## 2014-09-24 NOTE — ED Provider Notes (Signed)
CSN: [id number]     Arrival date & time 09/24/14  1123 History   First MD Initiated Contact with Patient 09/24/14 1128     Chief Complaint  Patient presents with  . Nausea     (Consider location/radiation/quality/duration/timing/severity/associated sxs/prior Treatment) HPI Comments: The patient is an 81 year old female, she presents from her independent living facility today where she had been complaining of nausea with some difficulty urinating and no bowel movement over the last week. She states that this has been persistent, nothing seems to make it better or worse, not associated with ha, cp, sob, swelling, or bleeding.  She has recently been treated with Tramadol for hip pain after ER w/u - she thought this was causing her nausea and stopped taking in 1 week ago - she has had ongoingn ausea (better now), she also stopp3ed her other meds b/c of nausea including her amlodipine, Imdur, Hyzaar, metoprolol, Requip, Crestor, Maxide. Paramedics report that the patient was tachycardic to 130 bpm, here she is closer to 109 bpm. They also reported a blood sugar of 91.  The history is provided by the patient.    Past Medical History  Diagnosis Date  . High blood pressure   . Chest tightness   . Palpitations   . Anemia   . Leg pain     with walking  . Carotid arterial disease     asymptomatic   . Coronary artery disease    Past Surgical History  Procedure Laterality Date  . Eye surgery  2011    Cataract surgery Bilateral  . Eye surgery  2011    Bilateral eyelid surgery for ptosis  . Cardiac catheterization  07/15/04    noncritical CAD,80% PLA treated medically. EF% 60. no RAS.  [name]. Renal artery duplex  02/23/13    ABDOMINAL AORTA:<50% DIAMETER REDUCTION. RIGHT RENAL ARTERY: 60-99% DIAMETER REDUCTION.[name]. LEFT RENAL ARTERY:1-59% DIAMETER REDUCTION.  . Carotid endarterectomy      RIGHT=05/11/11, LEFT=03/30/11   Family History  Problem Relation Age of Onset  . Lung disease Mother   . Heart  disease Father   . Cancer Sister     liver cancer  . Heart disease Brother    History  Substance Use Topics  . Smoking status: Never Smoker   . Smokeless tobacco: Never Used  . Alcohol Use: No   OB History    No data available     Review of Systems  All other systems reviewed and are negative.     Allergies  Cardura; Lotensin; and Tramadol  Home Medications   Prior to Admission medications   Medication Sig Start Date End Date Taking? Authorizing Provider  amLODipine (NORVASC) 10 MG tablet Take 10 mg by mouth every morning.   Yes Historical Provider, MD  Cholecalciferol (VITAMIN D PO) Take 1 tablet by mouth every morning.    Yes Historical Provider, MD  isosorbide mononitrate (IMDUR) 60 MG 24 hr tablet Take 60 mg by mouth every evening.    Yes Historical Provider, MD  MAGNESIUM PO Take 2 tablets by mouth 2 (two) times a week.   Yes Historical Provider, MD  metoprolol (LOPRESSOR) 100 MG tablet Take 100 mg by mouth 2 (two) times daily.  03/07/14  Yes Historical Provider, MD  NAPROXEN PO Take 1 tablet by mouth 2 (two) times daily as needed (pain).    Yes Historical Provider, MD  rOPINIRole (REQUIP) 1 MG tablet Take 1 mg by mouth 2 (two) times daily.     Yes Historical  Provider, MD  rosuvastatin (CRESTOR) 40 MG tablet Take 10 mg by mouth every evening.   Yes Historical Provider, MD  amLODipine (NORVASC) 5 MG tablet Take 1.5 tablets (7.5 mg total) by mouth daily. Patient not taking: Reported on 09/24/2014 06/12/13   Mihai Croitoru, MD  ondansetron (ZOFRAN ODT) 4 MG disintegrating tablet Take 1 tablet (4 mg total) by mouth every 8 (eight) hours as needed for nausea. 09/24/14   Eber Hong, MD  traMADol (ULTRAM) 50 MG tablet Take 1 tablet (50 mg total) by mouth every 6 (six) hours as needed. Patient not taking: Reported on 09/24/2014 09/05/14   Blake Divine, MD  triamterene-hydrochlorothiazide (MAXZIDE-25) 37.5-25 MG per tablet Take 1 tablet by mouth daily. Patient not taking: Reported on  09/05/2014 08/16/13   Runell Gess, MD   BP 174/94 mmHg  Pulse 128  Temp(Src) 98.4 F (36.9 C) (Oral)  Resp 27  Ht  (1.575 m)  Wt 150 lb (68.04 kg)  BMI 27.43 kg/m2  SpO2 99% Physical Exam  Constitutional: She appears well-developed and well-nourished. No distress.  HENT:  Head: Normocephalic and atraumatic.  Mouth/Throat: No oropharyngeal exudate.  Mucous membranes mildly dehydrated  Eyes: Conjunctivae and EOM are normal. Pupils are equal, round, and reactive to light. Right eye exhibits no discharge. Left eye exhibits no discharge. No scleral icterus.  Neck: Normal range of motion. Neck supple. No JVD present. No thyromegaly present.  Cardiovascular: Regular rhythm, normal heart sounds and intact distal pulses.  Exam reveals no gallop and no friction rub.   No murmur heard. Tachycardic to 110 bpm, strong pulses at the radial arteries, no JVD  Pulmonary/Chest: Effort normal and breath sounds normal. No respiratory distress. She has no wheezes. She has no rales.  Abdominal: Soft. Bowel sounds are normal. She exhibits no distension and no mass. There is no tenderness.  Musculoskeletal: Normal range of motion. She exhibits no edema or tenderness.  Lymphadenopathy:    She has no cervical adenopathy.  Neurological: She is alert. Coordination normal.  Moves all extremities without difficulty, has normal speech and coordination  Skin: Skin is warm and dry. No rash noted. No erythema.  Psychiatric: She has a normal mood and affect. Her behavior is normal.  Nursing note and vitals reviewed.   ED Course  Procedures (including critical care time) Labs Review Labs Reviewed  COMPREHENSIVE METABOLIC PANEL - Abnormal; Notable for the following:    Albumin 3.1 (*)    GFR calc non Af Amer 51 (*)    GFR calc Af Amer 60 (*)    All other components within normal limits  URINALYSIS, ROUTINE W REFLEX MICROSCOPIC - Abnormal; Notable for the following:    Ketones, ur >80 (*)    Protein,  ur 30 (*)    All other components within normal limits  URINE MICROSCOPIC-ADD ON - Abnormal; Notable for the following:    Squamous Epithelial / LPF MANY (*)    Bacteria, UA FEW (*)    All other components within normal limits  CBC  TROPONIN I    Imaging Review Dg Abd Acute W/chest  09/24/2014   CLINICAL DATA:  Nausea and vomiting for 2 weeks. Constipation. Hypertension. Chest tightness.  EXAM: ACUTE ABDOMEN SERIES (ABDOMEN 2 VIEW & CHEST 1 VIEW)  COMPARISON:  05/05/2011  FINDINGS: Fine reticulonodular peripheral interstitial accentuation in both lungs. Biapical pleural parenchymal scarring. Mild cardiomegaly.  No free intraperitoneal gas beneath the hemidiaphragms. Scattered gas and stool in the colon without a heavy stool  burden. No dilated small bowel. No significant abnormal air-fluid levels. Dextroconvex lumbar scoliosis.  IMPRESSION: 1. Abnormal interstitial accentuation in both lungs, possibly a manifestation of interstitial edema, atypical pneumonia, or drug reaction. Mild associated enlargement of the cardiopericardial silhouette 2. Bowel gas pattern unremarkable.   Electronically Signed   By: Gaylyn Rong M.D.   On: 09/24/2014 12:30     EKG Interpretation   Date/Time:  Monday September 24 2014 11:33:51 EST Ventricular Rate:  109 PR Interval:  180 QRS Duration: 109 QT Interval:  337 QTC Calculation: 454 R Axis:   -2 Text Interpretation:  Sinus tachycardia Non-specific ST-t changes Abnormal  ekg Since last tracing rate faster Confirmed by Tenessa Marsee  MD, Salle Brandle ([id number])  on 09/24/2014 11:37:11 AM      MDM   Final diagnoses:  SOB (shortness of breath)  Dehydration  Non-intractable vomiting with nausea, vomiting of unspecified type  Proteinuria    Decreased urinary output likely secondary to the patient's dehydration from persistent nausea and decreased oral intake, would also consider that a bulky constipation load could cause similar symptoms. She has no focal abdominal  tenderness, she is mildly tachycardic and appears dehydrated. Labs, fluid, imaging.  Labs reviewed - shows that she has ketonuria c/w dehdyration due to nausea - ongoing mild tachycardia - there are no other acute findings - she has no more nausea - tolerating PO and has no c/o at thsi time.  Will give home meds.  I discussed at the length with the pt that she had ongoing mild tachycardia and would benefit from ongoing evaluation and treatment of her dehydration / BP meds - she has taken her oral meds here and requested d/c - I again stated that she would do well and better to be admitted - she has declined and again requests d/c.  She does appear happy and interactive and has no nausea or other complaints at this time.  VS are stable except for mild tachycardia - 110 on my exam at 3:45 at time of d/c.  She has mild htn and again has been able to tolerate her oral meds for this without difficulty.  Will caution against the use of Ultram as this may have made her nauseated.  Meds given in ED:  Medications  rOPINIRole (REQUIP) tablet 1 mg (1 mg Oral Given 09/24/14 1522)  amLODipine (NORVASC) tablet 10 mg (10 mg Oral Given 09/24/14 1524)  metoprolol tartrate (LOPRESSOR) tablet 100 mg (100 mg Oral Given 09/24/14 1523)  0.9 %  sodium chloride infusion ( Intravenous New Bag/Given 09/24/14 1230)  sodium chloride 0.9 % bolus 500 mL (0 mLs Intravenous Stopped 09/24/14 1210)  0.9 %  sodium chloride infusion ( Intravenous Duplicate 09/24/14 1521)  sodium chloride 0.9 % bolus 500 mL (500 mLs Intravenous Bolus from Bag 09/24/14 1521)     Eber Hong, MD 09/24/14 1549

## 2014-09-24 NOTE — ED Notes (Signed)
Patients friends arrived to pick up patient, assisted with getting into car

## 2014-09-24 NOTE — ED Notes (Signed)
Called [facility] for transport back to residence.

## 2014-09-24 NOTE — ED Notes (Signed)
Patient requesting requip for while she's here

## 2014-09-24 NOTE — ED Notes (Signed)
CALLED TRANSPORT BACK; CANCELLED.

## 2014-09-24 NOTE — ED Notes (Signed)
Per EMS: nausea and vomiting x 2 weeks, difficulty with urine and no bm in a week.  HR 130 with ems, given 300 cc of nacl and HR decreased to 110.

## 2014-09-27 DIAGNOSIS — E860 Dehydration: Secondary | ICD-10-CM | POA: Diagnosis not present

## 2014-09-27 DIAGNOSIS — R0689 Other abnormalities of breathing: Secondary | ICD-10-CM | POA: Diagnosis not present

## 2014-09-27 DIAGNOSIS — R0989 Other specified symptoms and signs involving the circulatory and respiratory systems: Secondary | ICD-10-CM | POA: Diagnosis not present

## 2014-09-27 DIAGNOSIS — R824 Acetonuria: Secondary | ICD-10-CM | POA: Diagnosis not present

## 2014-09-27 DIAGNOSIS — E876 Hypokalemia: Secondary | ICD-10-CM | POA: Diagnosis not present

## 2014-09-27 DIAGNOSIS — M25551 Pain in right hip: Secondary | ICD-10-CM | POA: Diagnosis not present

## 2014-10-02 DIAGNOSIS — M5416 Radiculopathy, lumbar region: Secondary | ICD-10-CM | POA: Diagnosis not present

## 2014-10-15 DIAGNOSIS — M4806 Spinal stenosis, lumbar region: Secondary | ICD-10-CM | POA: Diagnosis not present

## 2014-10-25 DIAGNOSIS — E860 Dehydration: Secondary | ICD-10-CM | POA: Diagnosis not present

## 2014-10-25 DIAGNOSIS — R824 Acetonuria: Secondary | ICD-10-CM | POA: Diagnosis not present

## 2014-10-25 DIAGNOSIS — E876 Hypokalemia: Secondary | ICD-10-CM | POA: Diagnosis not present

## 2014-11-05 DIAGNOSIS — M5416 Radiculopathy, lumbar region: Secondary | ICD-10-CM | POA: Diagnosis not present

## 2014-11-27 DIAGNOSIS — M5416 Radiculopathy, lumbar region: Secondary | ICD-10-CM | POA: Diagnosis not present

## 2014-12-04 ENCOUNTER — Other Ambulatory Visit (HOSPITAL_COMMUNITY): Payer: Medicare Other

## 2014-12-04 ENCOUNTER — Ambulatory Visit: Payer: Medicare Other | Admitting: Family

## 2014-12-11 ENCOUNTER — Other Ambulatory Visit: Payer: Self-pay | Admitting: Internal Medicine

## 2014-12-11 ENCOUNTER — Ambulatory Visit
Admission: RE | Admit: 2014-12-11 | Discharge: 2014-12-11 | Disposition: A | Discharge status: Home / Self Care | Payer: Medicare Other | Source: Ambulatory Visit | Attending: Internal Medicine | Admitting: Internal Medicine

## 2014-12-11 DIAGNOSIS — R0602 Shortness of breath: Secondary | ICD-10-CM

## 2014-12-11 DIAGNOSIS — R270 Ataxia, unspecified: Secondary | ICD-10-CM

## 2014-12-11 DIAGNOSIS — R000 Tachycardia, unspecified: Secondary | ICD-10-CM | POA: Diagnosis not present

## 2014-12-11 DIAGNOSIS — R0902 Hypoxemia: Secondary | ICD-10-CM | POA: Diagnosis not present

## 2014-12-11 DIAGNOSIS — J84112 Idiopathic pulmonary fibrosis: Secondary | ICD-10-CM | POA: Diagnosis not present

## 2014-12-11 DIAGNOSIS — J8410 Pulmonary fibrosis, unspecified: Secondary | ICD-10-CM | POA: Diagnosis not present

## 2014-12-11 MED ORDER — IOPAMIDOL (ISOVUE-370) INJECTION 76%
120.0000 mL | Freq: Once | INTRAVENOUS | Status: AC | PRN
  Administered 2014-12-11: 120 mL via INTRAVENOUS

## 2014-12-13 DIAGNOSIS — R000 Tachycardia, unspecified: Secondary | ICD-10-CM | POA: Insufficient documentation

## 2014-12-19 DIAGNOSIS — M5416 Radiculopathy, lumbar region: Secondary | ICD-10-CM | POA: Diagnosis not present

## 2014-12-24 DIAGNOSIS — R002 Palpitations: Secondary | ICD-10-CM | POA: Diagnosis not present

## 2014-12-24 DIAGNOSIS — J8489 Other specified interstitial pulmonary diseases: Secondary | ICD-10-CM | POA: Diagnosis not present

## 2014-12-25 ENCOUNTER — Ambulatory Visit (INDEPENDENT_AMBULATORY_CARE_PROVIDER_SITE_OTHER): Payer: Medicare Other | Admitting: Internal Medicine

## 2014-12-25 ENCOUNTER — Encounter: Payer: Self-pay | Admitting: Internal Medicine

## 2014-12-25 VITALS — BP 178/78 | HR 62 | Ht 62.0 in | Wt 143.4 lb

## 2014-12-25 DIAGNOSIS — R0600 Dyspnea, unspecified: Secondary | ICD-10-CM | POA: Diagnosis not present

## 2014-12-25 DIAGNOSIS — J8410 Pulmonary fibrosis, unspecified: Secondary | ICD-10-CM

## 2014-12-25 DIAGNOSIS — I10 Essential (primary) hypertension: Secondary | ICD-10-CM

## 2014-12-25 NOTE — Patient Instructions (Signed)
GERD (REFLUX)  is an extremely common cause of respiratory symptoms just like yours , many times with no obvious heartburn at all.    It can be treated with medication, but also with lifestyle changes including avoidance of late meals, elevation of the head of your bed (ideally with 6 inch  bed blocks) excessive alcohol, smoking cessation, and avoid excess  fatty foods, chocolate, peppermint, colas, red wine, and acidic juices such as orange juice.  NO MINT OR MENTHOL PRODUCTS SO NO COUGH DROPS  USE SUGARLESS CANDY INSTEAD (Jolley ranchers or Stover's or Life Savers) or even ice chips will also do - the key is to swallow to prevent all throat clearing. NO OIL BASED VITAMINS - use powdered substitutes.   Please schedule a follow up office visit in 6 weeks, call sooner if needed with pfts on return and then consider acid suppression

## 2014-12-25 NOTE — Progress Notes (Signed)
   Subjective:    Patient ID: [name], female    DOB: 06/05/1927,    MRN: [id number]  HPI   [id number] yowf never smoker referred to pulmonary clinic 12/25/14 by Dr Renne CriglerPharr with abn CT chest c/w UIP    12/25/2014 1st [facility] office visit/ [facility]   Chief Complaint  Patient presents with  . Pulmonary Consult    Referred by Dr. Merri BrunetteWalter Pharr for eval of abnormal cxr. Pt states she has had cough and DOE on and off for "years". Her cough is non prod.   onset cough /sob was insidious, minimally progressive severity x sev years with no h/o rhematism or chemo/ amio exposure  Cough is worse with exertion, no relation to meals and no h/o dysphagia, not noct/ always dry.  Not limited by breathing from desired activities    No obvious day to day or daytime variabilty or assoc   cp or chest tightness, subjective wheeze overt sinus or hb symptoms. No unusual exp hx or h/o childhood pna/ asthma or knowledge of premature birth.  Sleeping ok without nocturnal  or early am exacerbation  of respiratory  c/o's or need for noct saba. Also denies any obvious fluctuation of symptoms with weather or environmental changes or other aggravating or alleviating factors except as outlined above   Current Medications, Allergies, Complete Past Medical History, Past Surgical History, Family History, and Social History were reviewed in [facility] electronic medical record.           Review of Systems  Constitutional: Negative for fever, chills and unexpected weight change.  HENT: Negative for congestion, dental problem, ear pain, nosebleeds, postnasal drip, rhinorrhea, sinus pressure, sneezing, sore throat, trouble swallowing and voice change.   Eyes: Negative for visual disturbance.  Respiratory: Positive for cough. Negative for choking and shortness of breath.   Cardiovascular: Negative for chest pain and leg swelling.  Gastrointestinal: Negative for vomiting, abdominal pain and diarrhea.  Genitourinary:  Negative for difficulty urinating.  Musculoskeletal: Negative for arthralgias.  Skin: Negative for rash.  Neurological: Negative for tremors, syncope and headaches.  Hematological: Does not bruise/bleed easily.       Objective:   Physical Exam  amb wf nad  Wt Readings from Last 3 Encounters:  12/25/14 143 lb 6.4 oz (65.046 kg)  09/24/14 150 lb (68.04 kg)  03/21/14 153 lb 14.4 oz (69.809 kg)    Vital signs reviewed   HEENT: nl dentition, turbinates, and orophanx. Nl external ear canals without cough reflex   NECK :  without JVD/Nodes/TM/ nl carotid upstrokes bilaterally   LUNGS: no acc muscle use, classic velcro insp crackles both bases    CV:  RRR  no s3 or murmur or increase in P2, no edema   ABD:  soft and nontender with nl excursion in the supine position. No bruits or organomegaly, bowel sounds nl  MS:  warm without deformities, calf tenderness, cyanosis or clubbing  SKIN: warm and dry without lesions    NEURO:  alert, approp, no deficits     I personally reviewed images and agree with radiology impression as follows:  CXR:  12/11/14 1. No evidence of acute pulmonary embolism. 2. Fibrotic changes particularly in the lung bases posteriorly most consistent with UIP. 3. Calcified granulomas consistent with prior granulomatous disease.        Assessment & Plan:

## 2014-12-26 ENCOUNTER — Encounter: Payer: Self-pay | Admitting: Internal Medicine

## 2014-12-26 DIAGNOSIS — I10 Essential (primary) hypertension: Secondary | ICD-10-CM | POA: Insufficient documentation

## 2014-12-26 NOTE — Assessment & Plan Note (Signed)
Not  Adequate control on present rx, reviewed > no change in rx needed  For now but needs to watch salt/ monitor/ f/u with Dr Renne CriglerPharr

## 2014-12-26 NOTE — Assessment & Plan Note (Addendum)
-   present on CT chest 12/29/2010    - 12/25/2014  Walked RA x 3 laps @ 185 ft each stopped due to  End of study, nl sats, min sob   DDx for pulmonary fibrosis  includes idiopathic pulmonary fibrosis, pulmonary fibrosis associated with rheumatologic diseases (which have a relatively benign course in most cases) , adverse effect from  drugs such as chemotherapy or amiodarone exposure, nonspecific interstitial pneumonia which is typically steroid responsive, and chronic hypersensitivity pneumonitis.   In active  smokers Langerhan's Cell  Histiocyctosis (eosinophilic granuomatosis),  DIP,  and Respiratory Bronchiolitis ILD also need to be considered,  But most likely this is bland UIP in the elderly and seems very slow re progression and minimally symptomatic at this point    Use of PPI is associated with improved survival time and with decreased radiologic fibrosis per King's study published in [facility] vol 184 p1390.  Dec 2011  This may not be cause and effect, but given how universally unimpressive and expensive  all the other  Drugs developed to day  have been for pf,   rec start   diet/ lifestyle modification and f/u with serial walking sats and lung volumes for now to put more points on the curve / establish firm baseline before considering adding ppi next visit then repeat study in 6 m

## 2015-01-01 DIAGNOSIS — I10 Essential (primary) hypertension: Secondary | ICD-10-CM | POA: Diagnosis not present

## 2015-01-01 DIAGNOSIS — E780 Pure hypercholesterolemia: Secondary | ICD-10-CM | POA: Diagnosis not present

## 2015-01-01 DIAGNOSIS — G2581 Restless legs syndrome: Secondary | ICD-10-CM | POA: Diagnosis not present

## 2015-01-15 DIAGNOSIS — Z961 Presence of intraocular lens: Secondary | ICD-10-CM | POA: Diagnosis not present

## 2015-01-15 DIAGNOSIS — H26493 Other secondary cataract, bilateral: Secondary | ICD-10-CM | POA: Diagnosis not present

## 2015-01-28 DIAGNOSIS — M47816 Spondylosis without myelopathy or radiculopathy, lumbar region: Secondary | ICD-10-CM | POA: Diagnosis not present

## 2015-02-06 ENCOUNTER — Ambulatory Visit (INDEPENDENT_AMBULATORY_CARE_PROVIDER_SITE_OTHER): Payer: Medicare Other | Admitting: Internal Medicine

## 2015-02-06 ENCOUNTER — Ambulatory Visit: Payer: Medicare Other | Admitting: Internal Medicine

## 2015-02-06 ENCOUNTER — Telehealth: Payer: Self-pay | Admitting: Internal Medicine

## 2015-02-06 DIAGNOSIS — R0600 Dyspnea, unspecified: Secondary | ICD-10-CM

## 2015-02-06 LAB — PULMONARY FUNCTION TEST
DL/VA % pred: 66 %
DL/VA: 3 ml/min/mmHg/L
DLCO unc % pred: 47 %
DLCO unc: 10.28 ml/min/mmHg
FEF 25-75 Post: 2.69 L/sec
FEF 25-75 Pre: 2.33 L/sec
FEF2575-%Change-Post: 15 %
FEF2575-%Pred-Post: 280 %
FEF2575-%Pred-Pre: 243 %
FEV1-%Change-Post: 5 %
FEV1-%Pred-Post: 114 %
FEV1-%Pred-Pre: 108 %
FEV1-Post: 1.75 L
FEV1-Pre: 1.65 L
FEV1FVC-%Change-Post: 4 %
FEV1FVC-%Pred-Pre: 119 %
FEV6-%Change-Post: 1 %
FEV6-%Pred-Post: 100 %
FEV6-%Pred-Pre: 99 %
FEV6-Post: 1.95 L
FEV6-Pre: 1.92 L
FEV6FVC-%Pred-Post: 107 %
FEV6FVC-%Pred-Pre: 107 %
FVC-%Change-Post: 1 %
FVC-%Pred-Post: 93 %
FVC-%Pred-Pre: 92 %
FVC-Post: 1.95 L
FVC-Pre: 1.92 L
Post FEV1/FVC ratio: 90 %
Post FEV6/FVC ratio: 100 %
Pre FEV1/FVC ratio: 86 %
Pre FEV6/FVC Ratio: 100 %
RV % pred: 56 %
RV: 1.36 L
TLC % pred: 69 %
TLC: 3.32 L

## 2015-02-06 NOTE — Progress Notes (Signed)
Quick Note:  LMTCB ______ 

## 2015-02-06 NOTE — Progress Notes (Signed)
PFT done today. 

## 2015-02-06 NOTE — Telephone Encounter (Signed)
Result Note     Call patient : Study is no surprises, set up for ov in 6 months  ---  I spoke with patient about results and she verbalized understanding and had no questions

## 2015-02-07 DIAGNOSIS — M545 Low back pain: Secondary | ICD-10-CM | POA: Diagnosis not present

## 2015-02-07 DIAGNOSIS — M25551 Pain in right hip: Secondary | ICD-10-CM | POA: Diagnosis not present

## 2015-02-07 DIAGNOSIS — M6281 Muscle weakness (generalized): Secondary | ICD-10-CM | POA: Diagnosis not present

## 2015-02-11 DIAGNOSIS — M545 Low back pain: Secondary | ICD-10-CM | POA: Diagnosis not present

## 2015-02-11 DIAGNOSIS — M6281 Muscle weakness (generalized): Secondary | ICD-10-CM | POA: Diagnosis not present

## 2015-02-11 DIAGNOSIS — M25551 Pain in right hip: Secondary | ICD-10-CM | POA: Diagnosis not present

## 2015-02-14 DIAGNOSIS — M6281 Muscle weakness (generalized): Secondary | ICD-10-CM | POA: Diagnosis not present

## 2015-02-14 DIAGNOSIS — M545 Low back pain: Secondary | ICD-10-CM | POA: Diagnosis not present

## 2015-02-14 DIAGNOSIS — M25551 Pain in right hip: Secondary | ICD-10-CM | POA: Diagnosis not present

## 2015-02-18 ENCOUNTER — Telehealth: Payer: Self-pay | Admitting: Cardiovascular Disease

## 2015-02-18 DIAGNOSIS — I10 Essential (primary) hypertension: Secondary | ICD-10-CM

## 2015-02-18 DIAGNOSIS — I701 Atherosclerosis of renal artery: Secondary | ICD-10-CM

## 2015-02-18 NOTE — Telephone Encounter (Signed)
Pt is supposed to see Dr C in September. She wants to know if she need to have an ultrasound of her kidney and doppler for her carotid artery before her appt.??

## 2015-02-18 NOTE — Telephone Encounter (Signed)
Last renal 02/2014 Last carotid 11/2012  Need to repeat?  Routed to MD

## 2015-02-19 DIAGNOSIS — M25551 Pain in right hip: Secondary | ICD-10-CM | POA: Diagnosis not present

## 2015-02-19 DIAGNOSIS — M545 Low back pain: Secondary | ICD-10-CM | POA: Diagnosis not present

## 2015-02-19 DIAGNOSIS — M6281 Muscle weakness (generalized): Secondary | ICD-10-CM | POA: Diagnosis not present

## 2015-02-19 NOTE — Telephone Encounter (Signed)
Please schedule for renal Duplex US for right renal artery stenosis. Carotids were being checked at [facility] and I would have her call there to see if they are needed again.

## 2015-02-21 DIAGNOSIS — M25551 Pain in right hip: Secondary | ICD-10-CM | POA: Diagnosis not present

## 2015-02-21 DIAGNOSIS — M6281 Muscle weakness (generalized): Secondary | ICD-10-CM | POA: Diagnosis not present

## 2015-02-21 DIAGNOSIS — M545 Low back pain: Secondary | ICD-10-CM | POA: Diagnosis not present

## 2015-02-21 NOTE — Telephone Encounter (Signed)
Renal doppler ordered. LM for patient that renal doppler can be done at our office but she should contact [facility] for carotid doppler.

## 2015-02-25 ENCOUNTER — Telehealth (HOSPITAL_COMMUNITY): Payer: Self-pay | Admitting: *Deleted

## 2015-02-25 DIAGNOSIS — M6281 Muscle weakness (generalized): Secondary | ICD-10-CM | POA: Diagnosis not present

## 2015-02-25 DIAGNOSIS — M545 Low back pain: Secondary | ICD-10-CM | POA: Diagnosis not present

## 2015-02-25 DIAGNOSIS — M25551 Pain in right hip: Secondary | ICD-10-CM | POA: Diagnosis not present

## 2015-02-28 DIAGNOSIS — M25551 Pain in right hip: Secondary | ICD-10-CM | POA: Diagnosis not present

## 2015-02-28 DIAGNOSIS — M6281 Muscle weakness (generalized): Secondary | ICD-10-CM | POA: Diagnosis not present

## 2015-02-28 DIAGNOSIS — M545 Low back pain: Secondary | ICD-10-CM | POA: Diagnosis not present

## 2015-03-04 DIAGNOSIS — M47816 Spondylosis without myelopathy or radiculopathy, lumbar region: Secondary | ICD-10-CM | POA: Diagnosis not present

## 2015-03-05 DIAGNOSIS — M545 Low back pain: Secondary | ICD-10-CM | POA: Diagnosis not present

## 2015-03-05 DIAGNOSIS — M25551 Pain in right hip: Secondary | ICD-10-CM | POA: Diagnosis not present

## 2015-03-05 DIAGNOSIS — M6281 Muscle weakness (generalized): Secondary | ICD-10-CM | POA: Diagnosis not present

## 2015-03-07 DIAGNOSIS — M6281 Muscle weakness (generalized): Secondary | ICD-10-CM | POA: Diagnosis not present

## 2015-03-07 DIAGNOSIS — M25551 Pain in right hip: Secondary | ICD-10-CM | POA: Diagnosis not present

## 2015-03-07 DIAGNOSIS — M545 Low back pain: Secondary | ICD-10-CM | POA: Diagnosis not present

## 2015-03-12 DIAGNOSIS — M545 Low back pain: Secondary | ICD-10-CM | POA: Diagnosis not present

## 2015-03-12 DIAGNOSIS — M6281 Muscle weakness (generalized): Secondary | ICD-10-CM | POA: Diagnosis not present

## 2015-03-12 DIAGNOSIS — M25551 Pain in right hip: Secondary | ICD-10-CM | POA: Diagnosis not present

## 2015-03-15 DIAGNOSIS — M25551 Pain in right hip: Secondary | ICD-10-CM | POA: Diagnosis not present

## 2015-03-15 DIAGNOSIS — M6281 Muscle weakness (generalized): Secondary | ICD-10-CM | POA: Diagnosis not present

## 2015-03-15 DIAGNOSIS — M545 Low back pain: Secondary | ICD-10-CM | POA: Diagnosis not present

## 2015-03-18 ENCOUNTER — Ambulatory Visit (HOSPITAL_COMMUNITY)
Admission: RE | Admit: 2015-03-18 | Discharge: 2015-03-18 | Disposition: A | Discharge status: Home / Self Care | Payer: Medicare Other | Source: Ambulatory Visit | Attending: Cardiovascular Disease | Admitting: Cardiovascular Disease

## 2015-03-18 DIAGNOSIS — I701 Atherosclerosis of renal artery: Secondary | ICD-10-CM | POA: Insufficient documentation

## 2015-03-18 DIAGNOSIS — I10 Essential (primary) hypertension: Secondary | ICD-10-CM | POA: Insufficient documentation

## 2015-03-18 DIAGNOSIS — Z23 Encounter for immunization: Secondary | ICD-10-CM | POA: Diagnosis not present

## 2015-03-18 DIAGNOSIS — Q253 Supravalvular aortic stenosis: Secondary | ICD-10-CM | POA: Insufficient documentation

## 2015-03-19 DIAGNOSIS — M545 Low back pain: Secondary | ICD-10-CM | POA: Diagnosis not present

## 2015-03-19 DIAGNOSIS — M6281 Muscle weakness (generalized): Secondary | ICD-10-CM | POA: Diagnosis not present

## 2015-03-19 DIAGNOSIS — M25551 Pain in right hip: Secondary | ICD-10-CM | POA: Diagnosis not present

## 2015-03-22 DIAGNOSIS — M25551 Pain in right hip: Secondary | ICD-10-CM | POA: Diagnosis not present

## 2015-03-22 DIAGNOSIS — M545 Low back pain: Secondary | ICD-10-CM | POA: Diagnosis not present

## 2015-03-22 DIAGNOSIS — M6281 Muscle weakness (generalized): Secondary | ICD-10-CM | POA: Diagnosis not present

## 2015-03-25 DIAGNOSIS — M6281 Muscle weakness (generalized): Secondary | ICD-10-CM | POA: Diagnosis not present

## 2015-03-25 DIAGNOSIS — M545 Low back pain: Secondary | ICD-10-CM | POA: Diagnosis not present

## 2015-03-25 DIAGNOSIS — M25551 Pain in right hip: Secondary | ICD-10-CM | POA: Diagnosis not present

## 2015-03-29 DIAGNOSIS — M6281 Muscle weakness (generalized): Secondary | ICD-10-CM | POA: Diagnosis not present

## 2015-03-29 DIAGNOSIS — M545 Low back pain: Secondary | ICD-10-CM | POA: Diagnosis not present

## 2015-03-29 DIAGNOSIS — M25551 Pain in right hip: Secondary | ICD-10-CM | POA: Diagnosis not present

## 2015-04-01 DIAGNOSIS — M545 Low back pain: Secondary | ICD-10-CM | POA: Diagnosis not present

## 2015-04-01 DIAGNOSIS — M6281 Muscle weakness (generalized): Secondary | ICD-10-CM | POA: Diagnosis not present

## 2015-04-01 DIAGNOSIS — M25551 Pain in right hip: Secondary | ICD-10-CM | POA: Diagnosis not present

## 2015-04-04 DIAGNOSIS — M6281 Muscle weakness (generalized): Secondary | ICD-10-CM | POA: Diagnosis not present

## 2015-04-04 DIAGNOSIS — M25551 Pain in right hip: Secondary | ICD-10-CM | POA: Diagnosis not present

## 2015-04-04 DIAGNOSIS — M545 Low back pain: Secondary | ICD-10-CM | POA: Diagnosis not present

## 2015-04-15 DIAGNOSIS — M25551 Pain in right hip: Secondary | ICD-10-CM | POA: Diagnosis not present

## 2015-04-15 DIAGNOSIS — M6281 Muscle weakness (generalized): Secondary | ICD-10-CM | POA: Diagnosis not present

## 2015-04-15 DIAGNOSIS — M545 Low back pain: Secondary | ICD-10-CM | POA: Diagnosis not present

## 2015-04-18 ENCOUNTER — Encounter: Payer: Self-pay | Admitting: Cardiovascular Disease

## 2015-04-18 ENCOUNTER — Ambulatory Visit (INDEPENDENT_AMBULATORY_CARE_PROVIDER_SITE_OTHER): Payer: Medicare Other | Admitting: Cardiovascular Disease

## 2015-04-18 VITALS — BP 146/64 | HR 66 | Resp 16 | Ht 62.0 in | Wt 146.0 lb

## 2015-04-18 DIAGNOSIS — I2510 Atherosclerotic heart disease of native coronary artery without angina pectoris: Secondary | ICD-10-CM

## 2015-04-18 DIAGNOSIS — I440 Atrioventricular block, first degree: Secondary | ICD-10-CM | POA: Diagnosis not present

## 2015-04-18 DIAGNOSIS — I701 Atherosclerosis of renal artery: Secondary | ICD-10-CM

## 2015-04-18 DIAGNOSIS — E785 Hyperlipidemia, unspecified: Secondary | ICD-10-CM

## 2015-04-18 DIAGNOSIS — I6523 Occlusion and stenosis of bilateral carotid arteries: Secondary | ICD-10-CM

## 2015-04-18 DIAGNOSIS — I10 Essential (primary) hypertension: Secondary | ICD-10-CM | POA: Diagnosis not present

## 2015-04-18 MED ORDER — ASPIRIN EC 81 MG PO TBEC: 81.0000 mg | DELAYED_RELEASE_TABLET | Freq: Every day | ORAL | Status: AC

## 2015-04-18 NOTE — Patient Instructions (Signed)
Your physician has recommended you make the following change in your medication: RESTART ASPIRIN  DAILY  CONTACT [facility] TO GET THE CAROTID DOPPLER SCHEDULED  Dr. Royann Shivers recommends that you schedule a follow-up appointment in: ONE YEAR

## 2015-04-18 NOTE — Progress Notes (Signed)
Patient ID: [name], female   DOB: [birth year], 81 y.o.   MRN: [id number]     Cardiology Office Note   Date:  04/20/2015   ID:  [name], DOB [birth year], MRN [id number]  PCP:  Londell Moh, MD  Cardiologist:   Thurmon Fair, MD   Chief Complaint  Patient presents with  . Annual Exam  . Edema    IN HER FEET BY THE END OF THE DAY.      History of Present Illness: [name] is a 81 y.o. female who presents for follow-up on PAD (carotid endarterectomy both sides, moderate to severe right renal artery stenosis, nonobstructive CAD). Hyperlipidemia (on statin) and HTN are well controlled, renal function is normal. She has mild ankle swelling at the end of the day, resolves overnight, no other CV complaints. She has back pain and now uses her walker. Still works in her garden.  No change in renal stenosis on 03/19/15 follow up study. Had to reschedule carotid study due to back pain.    Past Medical History  Diagnosis Date  . High blood pressure   . Chest tightness   . Palpitations   . Anemia   . Leg pain     with walking  . Carotid arterial disease (HCC)     asymptomatic   . Coronary artery disease     Past Surgical History  Procedure Laterality Date  . Eye surgery  2011    Cataract surgery Bilateral  . Eye surgery  2011    Bilateral eyelid surgery for ptosis  . Cardiac catheterization  07/15/04    noncritical CAD,80% PLA treated medically. EF% 60. no RAS.  [name] Renal artery duplex  02/23/13    ABDOMINAL AORTA:<50% DIAMETER REDUCTION. RIGHT RENAL ARTERY: 60-99% DIAMETER REDUCTION.[name] LEFT RENAL ARTERY:1-59% DIAMETER REDUCTION.  . Carotid endarterectomy      RIGHT=05/11/11, LEFT=03/30/11     Current Outpatient Prescriptions  Medication Sig Dispense Refill  . AMLODIPINE BESYLATE PO Take 2 tablets by mouth daily. ? strength    . Calcium Carbonate-Vitamin D (CALCIUM PLUS VITAMIN D PO) Take 1 tablet by mouth daily.    [name] conjugated estrogens  (PREMARIN) vaginal cream Place 1 Applicatorful vaginally 2 (two) times a week.    . isosorbide mononitrate (IMDUR) 60 MG 24 hr tablet Take 60 mg by mouth every evening.     [name] losartan-hydrochlorothiazide (HYZAAR) 100-25 MG per tablet Take 1 tablet by mouth daily.    [name] MAGNESIUM OXIDE PO Take by mouth daily.    . metoprolol (LOPRESSOR) 100 MG tablet Take 100 mg by mouth 2 (two) times daily.     [name] rOPINIRole (REQUIP) 1 MG tablet Take 1 mg by mouth 2 (two) times daily.      . rosuvastatin (CRESTOR) 10 MG tablet Take 10 mg by mouth daily.    [name] aspirin EC 81 MG tablet Take 1 tablet (81 mg total) by mouth daily. 30 tablet 11   No current facility-administered medications for this visit.    Allergies:   Cardura; Lotensin; and Tramadol    Social History:  The patient  reports that she has never smoked. She has never used smokeless tobacco. She reports that she does not drink alcohol or use illicit drugs.   Family History:  The patient's family history includes Cancer in her sister; Heart disease in her brother and father; Lung disease in her mother.    ROS:  Please see the history of present illness.  Otherwise, review of systems positive for none.   All other systems are reviewed and negative.    PHYSICAL EXAM: VS:  BP 146/64 mmHg  Pulse 66  Resp 16  Ht  (1.575 m)  Wt 146 lb (66.225 kg)  BMI 26.70 kg/m2 , BMI Body mass index is 26.7 kg/(m^2).  General: Alert, oriented x3, no distress Head: no evidence of trauma, PERRL, EOMI, no exophtalmos or lid lag, no myxedema, no xanthelasma; normal ears, nose and oropharynx Neck: normal jugular venous pulsations and no hepatojugular reflux; brisk carotid pulses without delay and faint bilateral carotid bruits Chest: clear to auscultation, no signs of consolidation by percussion or palpation, normal fremitus, symmetrical and full respiratory excursions Cardiovascular: normal position and quality of the apical impulse, regular rhythm, normal  first and second heart sounds, 2/6 early peaking aortic ejection murmur, no diastolic murmurs, rubs or gallops Abdomen: no tenderness or distention, no masses by palpation, no abnormal pulsatility or arterial bruits, normal bowel sounds, no hepatosplenomegaly Extremities: no clubbing, cyanosis or edema; 2+ radial, ulnar and brachial pulses bilaterally; 2+ right femoral, posterior tibial and dorsalis pedis pulses; 2+ left femoral, posterior tibial and dorsalis pedis pulses; no subclavian or femoral bruits Neurological: grossly nonfocal Psych: euthymic mood, full affect   EKG:  EKG is ordered today. The ekg ordered today demonstrates NSR, 1st degree AV block, leftward axis, no repol changes   Recent Labs: 09/24/2014: ALT 14; BUN 18; Creatinine, Ser 0.97; Hemoglobin 13.4; Platelets 322; Potassium 3.6; Sodium 140    Lipid Panel No results found for: CHOL, TRIG, HDL, CHOLHDL, VLDL, LDLCALC, LDLDIRECT    Wt Readings from Last 3 Encounters:  04/18/15 146 lb (66.225 kg)  12/25/14 143 lb 6.4 oz (65.046 kg)  09/24/14 150 lb (68.04 kg)      Other studies Reviewed: Additional studies/ records that were reviewed today include: .   ASSESSMENT AND PLAN:  1. Carotid stenosis s/p bilateral CEA - we will help her reschedule her [place] at [facility]. No neuro complaints 2. Moderate R renal artery stenosis, unchanged, good BP and renal function. 3. Hyperlipidemia - need to get recent labs; lipids OK in 2015. 4. Minor CAD, no angina or ECG changes  Not sure why she stopped ASA. Needs to resume it.  Current medicines are reviewed at length with the patient today.  The patient does not have concerns regarding medicines.  The following changes have been made:  no change  Labs/ tests ordered today include:  Orders Placed This Encounter  Procedures  . EKG 12-Lead    Patient Instructions  Your physician has recommended you make the following change in your medication: RESTART ASPIRIN  DAILY  CONTACT  [facility] TO GET THE CAROTID DOPPLER SCHEDULED  Dr. Royann Shivers recommends that you schedule a follow-up appointment in: ONE YEAR        SignedThurmon Fair, MD  04/20/2015 4:21 PM    Thurmon Fair, MD, [facility] [phone] office [phone] pager

## 2015-04-20 ENCOUNTER — Encounter: Payer: Self-pay | Admitting: Cardiovascular Disease

## 2015-05-07 ENCOUNTER — Telehealth: Payer: Self-pay | Admitting: Cardiovascular Disease

## 2015-05-07 DIAGNOSIS — I6523 Occlusion and stenosis of bilateral carotid arteries: Secondary | ICD-10-CM

## 2015-05-07 DIAGNOSIS — Z9889 Other specified postprocedural states: Secondary | ICD-10-CM

## 2015-05-07 NOTE — Telephone Encounter (Signed)
[name] is calling because she needs an order for a Carotid for [name] . Please call ..[name]

## 2015-05-07 NOTE — Telephone Encounter (Signed)
[facility]

## 2015-05-08 ENCOUNTER — Telehealth: Payer: Self-pay | Admitting: Cardiovascular Disease

## 2015-05-08 NOTE — Telephone Encounter (Signed)
Received a call from [place] at [facility].She stated she now sees order in for carotid dopplers.

## 2015-05-09 ENCOUNTER — Ambulatory Visit (HOSPITAL_COMMUNITY)
Admission: RE | Admit: 2015-05-09 | Discharge: 2015-05-09 | Disposition: A | Discharge status: Home / Self Care | Payer: Medicare Other | Source: Ambulatory Visit | Attending: Vascular Surgery | Admitting: Vascular Surgery

## 2015-05-09 DIAGNOSIS — Z9889 Other specified postprocedural states: Secondary | ICD-10-CM

## 2015-05-09 DIAGNOSIS — I6523 Occlusion and stenosis of bilateral carotid arteries: Secondary | ICD-10-CM | POA: Diagnosis not present

## 2015-05-27 DIAGNOSIS — L509 Urticaria, unspecified: Secondary | ICD-10-CM | POA: Diagnosis not present

## 2015-07-23 ENCOUNTER — Ambulatory Visit: Payer: Medicare Other | Admitting: Internal Medicine

## 2015-08-05 ENCOUNTER — Ambulatory Visit: Payer: Medicare Other | Admitting: Internal Medicine

## 2015-08-14 DIAGNOSIS — H01004 Unspecified blepharitis left upper eyelid: Secondary | ICD-10-CM | POA: Diagnosis not present

## 2015-09-05 ENCOUNTER — Ambulatory Visit: Payer: Medicare Other | Admitting: Internal Medicine

## 2015-09-17 ENCOUNTER — Other Ambulatory Visit: Payer: Self-pay | Admitting: Internal Medicine

## 2015-09-17 DIAGNOSIS — R0600 Dyspnea, unspecified: Secondary | ICD-10-CM

## 2015-09-18 ENCOUNTER — Ambulatory Visit (INDEPENDENT_AMBULATORY_CARE_PROVIDER_SITE_OTHER): Payer: Medicare Other | Admitting: Internal Medicine

## 2015-09-18 DIAGNOSIS — R0600 Dyspnea, unspecified: Secondary | ICD-10-CM | POA: Diagnosis not present

## 2015-09-18 LAB — PULMONARY FUNCTION TEST
DL/VA % pred: 68 %
DL/VA: 3.1 ml/min/mmHg/L
DLCO cor % pred: 39 %
DLCO cor: 8.57 ml/min/mmHg
DLCO unc % pred: 38 %
DLCO unc: 8.21 ml/min/mmHg
FEF 25-75 Post: 2.44 L/sec
FEF 25-75 Pre: 2.61 L/sec
FEF2575-%Change-Post: -6 %
FEF2575-%Pred-Post: 265 %
FEF2575-%Pred-Pre: 283 %
FEV1-%Change-Post: 1 %
FEV1-%Pred-Post: 111 %
FEV1-%Pred-Pre: 110 %
FEV1-Post: 1.67 L
FEV1-Pre: 1.65 L
FEV1FVC-%Change-Post: 7 %
FEV1FVC-%Pred-Pre: 118 %
FEV6-%Change-Post: -1 %
FEV6-%Pred-Post: 95 %
FEV6-%Pred-Pre: 96 %
FEV6-Post: 1.83 L
FEV6-Pre: 1.85 L
FEV6FVC-%Pred-Post: 107 %
FEV6FVC-%Pred-Pre: 107 %
FVC-%Change-Post: -5 %
FVC-%Pred-Post: 89 %
FVC-%Pred-Pre: 94 %
FVC-Post: 1.83 L
FVC-Pre: 1.94 L
Post FEV1/FVC ratio: 92 %
Post FEV6/FVC ratio: 100 %
Pre FEV1/FVC ratio: 85 %
Pre FEV6/FVC Ratio: 100 %
RV % pred: 62 %
RV: 1.51 L
TLC % pred: 72 %
TLC: 3.44 L

## 2015-09-18 NOTE — Progress Notes (Signed)
PFT done today. 

## 2015-09-19 ENCOUNTER — Ambulatory Visit (INDEPENDENT_AMBULATORY_CARE_PROVIDER_SITE_OTHER): Payer: Medicare Other | Admitting: Internal Medicine

## 2015-09-19 ENCOUNTER — Encounter: Payer: Self-pay | Admitting: Internal Medicine

## 2015-09-19 VITALS — BP 126/74 | HR 61 | Ht 62.0 in | Wt 149.0 lb

## 2015-09-19 DIAGNOSIS — J8410 Pulmonary fibrosis, unspecified: Secondary | ICD-10-CM | POA: Diagnosis not present

## 2015-09-19 MED ORDER — PANTOPRAZOLE SODIUM 40 MG PO TBEC: 40.0000 mg | DELAYED_RELEASE_TABLET | Freq: Every day | ORAL | Status: DC

## 2015-09-19 MED ORDER — FAMOTIDINE 20 MG PO TABS: ORAL_TABLET | ORAL | Status: DC

## 2015-09-19 NOTE — Progress Notes (Signed)
Subjective:    Patient ID: [name], female    DOB: 01/18/1927,    MRN: [id number]    Brief patient profile:  89 yowf never smoker referred to pulmonary clinic 12/25/14 by Dr Renne Crigler with abn CT chest c/w UIP    History of Present Illness  12/25/2014 1st [facility] office visit/ Lynsi Dooner   Chief Complaint  Patient presents with  . Pulmonary Consult    Referred by Dr. [name] for eval of abnormal cxr. Pt states she has had cough and DOE on and off for "years". Her cough is non prod.   onset cough /sob was insidious, minimally progressive severity x sev years with no h/o rhematism or chemo/ amio exposure  Cough is worse with exertion, no relation to meals and no h/o dysphagia, not noct/ always dry. rec gerd diet / f/u q 6 m   09/19/2015  f/u ov/[name] re: PF Chief Complaint  Patient presents with  . Follow-up    Cough is unchanged. She states her breathing is fine and "I didn't know I had a breathing problem".    cough is dry/ daytime sporadic, no pattern at all   No obvious day to day or daytime variability or assoc cp or chest tightness, subjective wheeze or overt sinus or hb symptoms. No unusual exp hx or h/o childhood pna/ asthma or knowledge of premature birth.  Sleeping ok without nocturnal  or early am exacerbation  of respiratory  c/o's or need for noct saba. Also denies any obvious fluctuation of symptoms with weather or environmental changes or other aggravating or alleviating factors except as outlined above   Current Medications, Allergies, Complete Past Medical History, Past Surgical History, Family History, and Social History were reviewed in [employer] record.  ROS  The following are not active complaints unless bolded sore throat, dysphagia, dental problems, itching, sneezing,  nasal congestion or excess/ purulent secretions, ear ache,   fever, chills, sweats, unintended wt loss, classically pleuritic or exertional cp, hemoptysis,   orthopnea pnd or leg swelling, presyncope, palpitations, abdominal pain, anorexia, nausea, vomiting, diarrhea  or change in bowel or bladder habits, change in stools or urine, dysuria,hematuria,  rash, arthralgias, visual complaints, headache, numbness, weakness or ataxia or problems with walking or coordination,  change in mood/affect or memory.               Objective:   Physical Exam  amb wf nad with freq throat clearing    09/19/2015         144   12/25/14 143 lb 6.4 oz (65.046 kg)  09/24/14 150 lb (68.04 kg)  03/21/14 153 lb 14.4 oz (69.809 kg)    Vital signs reviewed   HEENT: nl dentition, turbinates, and orophanx. Nl external ear canals without cough reflex   NECK :  without JVD/Nodes/TM/ nl carotid upstrokes bilaterally   LUNGS: no acc muscle use, classic velcro insp crackles both bases s cough on inspiration    CV:  RRR  no s3 or murmur or increase in P2, no edema   ABD:  soft and nontender with nl excursion in the supine position. No bruits or organomegaly, bowel sounds nl  MS:  warm without deformities, calf tenderness, cyanosis or clubbing  SKIN: warm and dry without lesions    NEURO:  alert, approp, no deficits     I personally reviewed images and agree with radiology impression as follows:  CTachest:  12/11/14 1. No evidence of acute pulmonary  embolism. 2. Fibrotic changes particularly in the lung bases posteriorly most consistent with UIP. 3. Calcified granulomas consistent with prior granulomatous disease.        Assessment & Plan:

## 2015-09-19 NOTE — Patient Instructions (Signed)
GERD (REFLUX)  is an extremely common cause of respiratory symptoms just like yours , many times with no obvious heartburn at all.    It can be treated with medication, but also with lifestyle changes including elevation of the head of your bed (ideally with 6 inch  bed blocks),  Smoking cessation, avoidance of late meals, excessive alcohol, and avoid fatty foods, chocolate, peppermint, colas, red wine, and acidic juices such as orange juice.  NO MINT OR MENTHOL PRODUCTS SO NO COUGH DROPS  USE SUGARLESS CANDY INSTEAD (Jolley ranchers or Stover's or Life Savers) or even ice chips will also do - the key is to swallow to prevent all throat clearing. NO OIL BASED VITAMINS - use powdered substitutes.   Pantoprazole (protonix) 40 mg   Take  30-60 min before first meal of the day and Pepcid (famotidine)  20 mg one @  bedtime until return to office - this is the best way to tell whether stomach acid is contributing to your problem.    Please schedule a follow up office visit in 6 weeks, call sooner if needed

## 2015-09-20 NOTE — Assessment & Plan Note (Signed)
-   present on CT chest 12/29/2010   - 12/25/2014  Walked RA x 3 laps @ 185 ft each stopped due to  End of study, nl sats, min sob  - PFTs  02/06/2015  VC 1.96 (94%) s obst and dlco 47 corrects to 66% > rec ov 6 m to repeat walking sats call sooner if losing ground - 09/19/2015   [facility]  [facility] @ 185 ft each stopped due to  Fatigue/ sats 88% at moderate pace  She is not apparently aware of any symptom but the sporadic coughing which may or may not prove to be related to PF which usually causes a specific cough pattern reproducible on insp which this is not.  Use of PPI is associated with improved survival time and with decreased radiologic fibrosis per King's study published in AJRCCM vol 184 p1390.  Dec 2011 and also may have other beneficial effects as per the latest review in [facility] vol 193 p1345 Jun 20016.  This may not always be cause and effect, but given how universally unimpressive and expensive  all the other  Drugs developed to day  have been for pf,   rec start  rx ppi / diet/ lifestyle modification and f/u for now q 6 weeeks with serial walking sats and  for now to put more points on the curve / establish firm baseline before considering additional measures.   I had an extended discussion with the patient reviewing all relevant studies completed to date and  lasting 15 to 20 minutes of a 25 minute visit    Each maintenance medication was reviewed in detail including most importantly the difference between maintenance and prns and under what circumstances the prns are to be triggered using an action plan format that is not reflected in the computer generated alphabetically organized AVS.    Please see instructions for details which were reviewed in writing and the patient given a copy highlighting the part that I personally wrote and discussed at today's ov.

## 2015-09-23 NOTE — Progress Notes (Signed)
Quick Note:    Pt notified  ______

## 2015-10-31 ENCOUNTER — Encounter: Payer: Self-pay | Admitting: Internal Medicine

## 2015-10-31 ENCOUNTER — Ambulatory Visit (INDEPENDENT_AMBULATORY_CARE_PROVIDER_SITE_OTHER): Payer: Medicare Other | Admitting: Internal Medicine

## 2015-10-31 VITALS — BP 148/60 | HR 61 | Ht 62.0 in | Wt 141.2 lb

## 2015-10-31 DIAGNOSIS — J8410 Pulmonary fibrosis, unspecified: Secondary | ICD-10-CM

## 2015-10-31 MED ORDER — FAMOTIDINE 20 MG PO TABS: ORAL_TABLET | ORAL | Status: DC

## 2015-10-31 MED ORDER — PANTOPRAZOLE SODIUM 40 MG PO TBEC: 40.0000 mg | DELAYED_RELEASE_TABLET | Freq: Every day | ORAL | Status: DC

## 2015-10-31 NOTE — Assessment & Plan Note (Signed)
-   present on CT chest 12/29/2010   - 12/25/2014  Walked RA x 3 laps @ 185 ft each stopped due to  End of study, nl sats, min sob  - PFTs  02/06/2015  VC 1.96 (94%) s obst and dlco 47 corrects to 66% > rec ov 6 m to repeat walking sats call sooner if losing ground - PFTs 09/18/15         VC 1.93(93%) s obst and dlco 38/39c correctst to 68%  - 09/19/2015   Walked RA  2 laps @ 185 ft each stopped due to  Fatigue/ sats 88% at moderate pace   Resolution of symptoms with gerd rx was impressive but no flare so far off them   I had an extended discussion with the patient reviewing all relevant studies completed to date and  lasting 15 to 20 minutes of a 25 minute visit on the following ongoing concerns:   Reviewed literature on PF and assoc with GERD - ok for now to hold rx but very low theshold to resume   Each maintenance medication was reviewed in detail including most importantly the difference between maintenance and as needed and under what circumstances the prns are to be used.  Please see instructions for details which were reviewed in writing and the patient given a copy.

## 2015-10-31 NOTE — Progress Notes (Signed)
Subjective:    Patient ID: [name], female    DOB: 11-25-1926,    MRN: [id number]    Brief patient profile:  83 yowf never smoker referred to pulmonary clinic 12/25/14 by Dr Renne Crigler with abn CT chest c/w UIP    History of Present Illness  12/25/2014 1st [facility] office visit/ [facility]   Chief Complaint  Patient presents with  . Pulmonary Consult    Referred by Dr. Merri Campbell for eval of abnormal cxr. Pt states she has had cough and DOE on and off for "years". Her cough is non prod.   onset cough /sob was insidious, minimally progressive severity x sev years with no h/o rhematism or chemo/ amio exposure  Cough is worse with exertion, no relation to meals and no h/o dysphagia, not noct/ always dry. rec gerd diet / f/u q 6 m   09/19/2015  f/u ov/[facility] re: PF Chief Complaint  Patient presents with  . Follow-up    Cough is unchanged. She states her breathing is fine and "I didn't know I had a breathing problem".   cough is dry/ daytime sporadic, no pattern at all  rec GERD  Pantoprazole (protonix) 40 mg  Take  30-60 min before first meal of the day and Pepcid (famotidine)  20 mg one @  bedtime until return to office     10/31/2015  f/u ov/[facility] re: Mild PF/ cough resolved on gerd rx/ stopped p one month rx and no relapse Chief Complaint  Patient presents with  . Follow-up    Breathing is doing well. She denies any co's today.   Not limited by breathing from desired activities  But by knees   No obvious day to day or daytime variability or assoc cp or chest tightness, subjective wheeze or overt sinus or hb symptoms. No unusual exp hx or h/o childhood pna/ asthma or knowledge of premature birth.  Sleeping ok without nocturnal  or early am exacerbation  of respiratory  c/o's or need for noct saba. Also denies any obvious fluctuation of symptoms with weather or environmental changes or other aggravating or alleviating factors except as outlined above   Current Medications,  Allergies, Complete Past Medical History, Past Surgical History, Family History, and Social History were reviewed in [employer] record.  ROS  The following are not active complaints unless bolded sore throat, dysphagia, dental problems, itching, sneezing,  nasal congestion or excess/ purulent secretions, ear ache,   fever, chills, sweats, unintended wt loss, classically pleuritic or exertional cp, hemoptysis,  orthopnea pnd or leg swelling, presyncope, palpitations, abdominal pain, anorexia, nausea, vomiting, diarrhea  or change in bowel or bladder habits, change in stools or urine, dysuria,hematuria,  rash, arthralgias, visual complaints, headache, numbness, weakness or ataxia or problems with walking or coordination,  change in mood/affect or memory.               Objective:   Physical Exam  amb wf nad  Minimal throat clearing   10/31/2015        141   09/19/2015         144   12/25/14 143 lb 6.4 oz (65.046 kg)  09/24/14 150 lb (68.04 kg)  03/21/14 153 lb 14.4 oz (69.809 kg)    Vital signs reviewed   HEENT: nl dentition, turbinates, and orophanx. Nl external ear canals without cough reflex   NECK :  without JVD/Nodes/TM/ nl carotid upstrokes bilaterally   LUNGS: no acc muscle use,  classic velcro insp crackles both bases s cough on inspiration    CV:  RRR  no s3 or murmur or increase in P2, no edema   ABD:  soft and nontender with nl excursion in the supine position. No bruits or organomegaly, bowel sounds nl  MS:  warm without deformities, calf tenderness, cyanosis or clubbing  SKIN: warm and dry without lesions    NEURO:  alert, approp, no deficits     I personally reviewed images and agree with radiology impression as follows:  CTachest:  12/11/14 1. No evidence of acute pulmonary embolism. 2. Fibrotic changes particularly in the lung bases posteriorly most consistent with UIP. 3. Calcified granulomas consistent with prior granulomatous  disease.        Assessment & Plan:

## 2015-10-31 NOTE — Patient Instructions (Addendum)
For any respiratory symptoms that flare > Pantoprazole (protonix) 40 mg   Take  30-60 min before first meal of the day and Pepcid (famotidine)  20 mg one @  bedtime until return to office - this is the best way to tell whether stomach acid is contributing to your problem.    Stick with the diet   Return 1st of September 2017 for pfts  - call sooner if needed

## 2015-11-08 ENCOUNTER — Other Ambulatory Visit: Payer: Self-pay | Admitting: Internal Medicine

## 2015-11-08 DIAGNOSIS — J8410 Pulmonary fibrosis, unspecified: Secondary | ICD-10-CM

## 2015-11-08 MED ORDER — PANTOPRAZOLE SODIUM 40 MG PO TBEC
40.0000 mg | DELAYED_RELEASE_TABLET | Freq: Every day | ORAL | Status: DC
Start: 2015-11-08 — End: 2016-08-24

## 2015-12-30 ENCOUNTER — Other Ambulatory Visit: Payer: Self-pay | Admitting: Internal Medicine

## 2016-01-13 DIAGNOSIS — M1990 Unspecified osteoarthritis, unspecified site: Secondary | ICD-10-CM | POA: Diagnosis not present

## 2016-01-13 DIAGNOSIS — M6530 Trigger finger, unspecified finger: Secondary | ICD-10-CM | POA: Diagnosis not present

## 2016-01-13 DIAGNOSIS — M8580 Other specified disorders of bone density and structure, unspecified site: Secondary | ICD-10-CM | POA: Diagnosis not present

## 2016-01-17 DIAGNOSIS — H524 Presbyopia: Secondary | ICD-10-CM | POA: Diagnosis not present

## 2016-01-17 DIAGNOSIS — H26491 Other secondary cataract, right eye: Secondary | ICD-10-CM | POA: Diagnosis not present

## 2016-01-29 DIAGNOSIS — H26491 Other secondary cataract, right eye: Secondary | ICD-10-CM | POA: Diagnosis not present

## 2016-03-24 ENCOUNTER — Other Ambulatory Visit: Payer: Self-pay

## 2016-03-26 IMAGING — DX DG ABDOMEN ACUTE W/ 1V CHEST
3 series · 3 of 3 positions shown · non-contrast
Comparison: 05/05/2011

CLINICAL DATA: Nausea and vomiting for 2 weeks. Constipation.
Hypertension. Chest tightness.

EXAM:
ACUTE ABDOMEN SERIES (ABDOMEN 2 VIEW & CHEST 1 VIEW)

[chest pa]
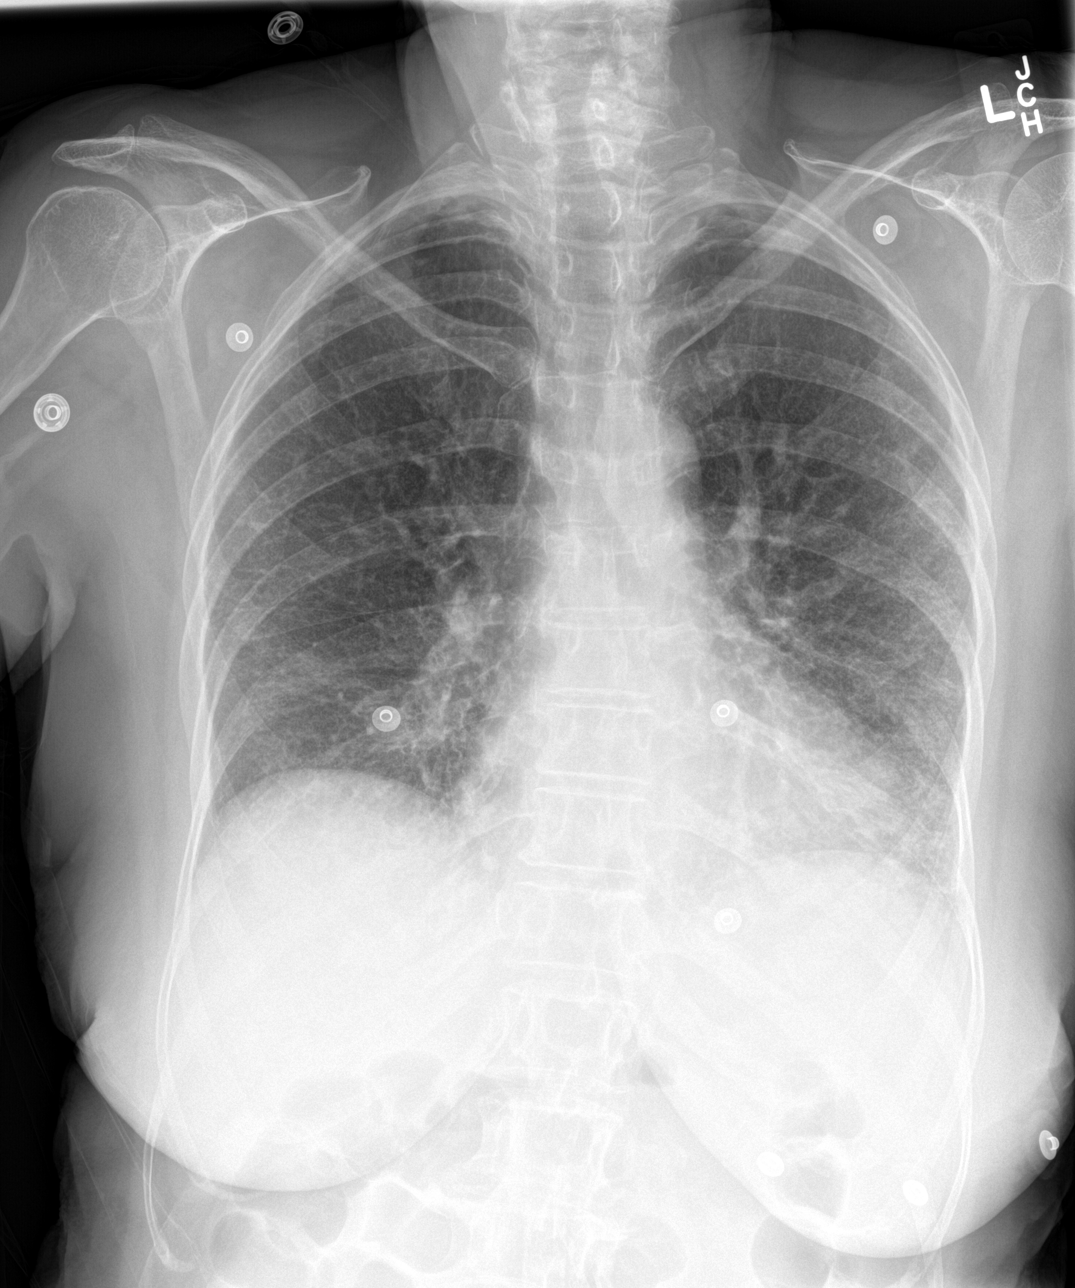

[abdomen erect]
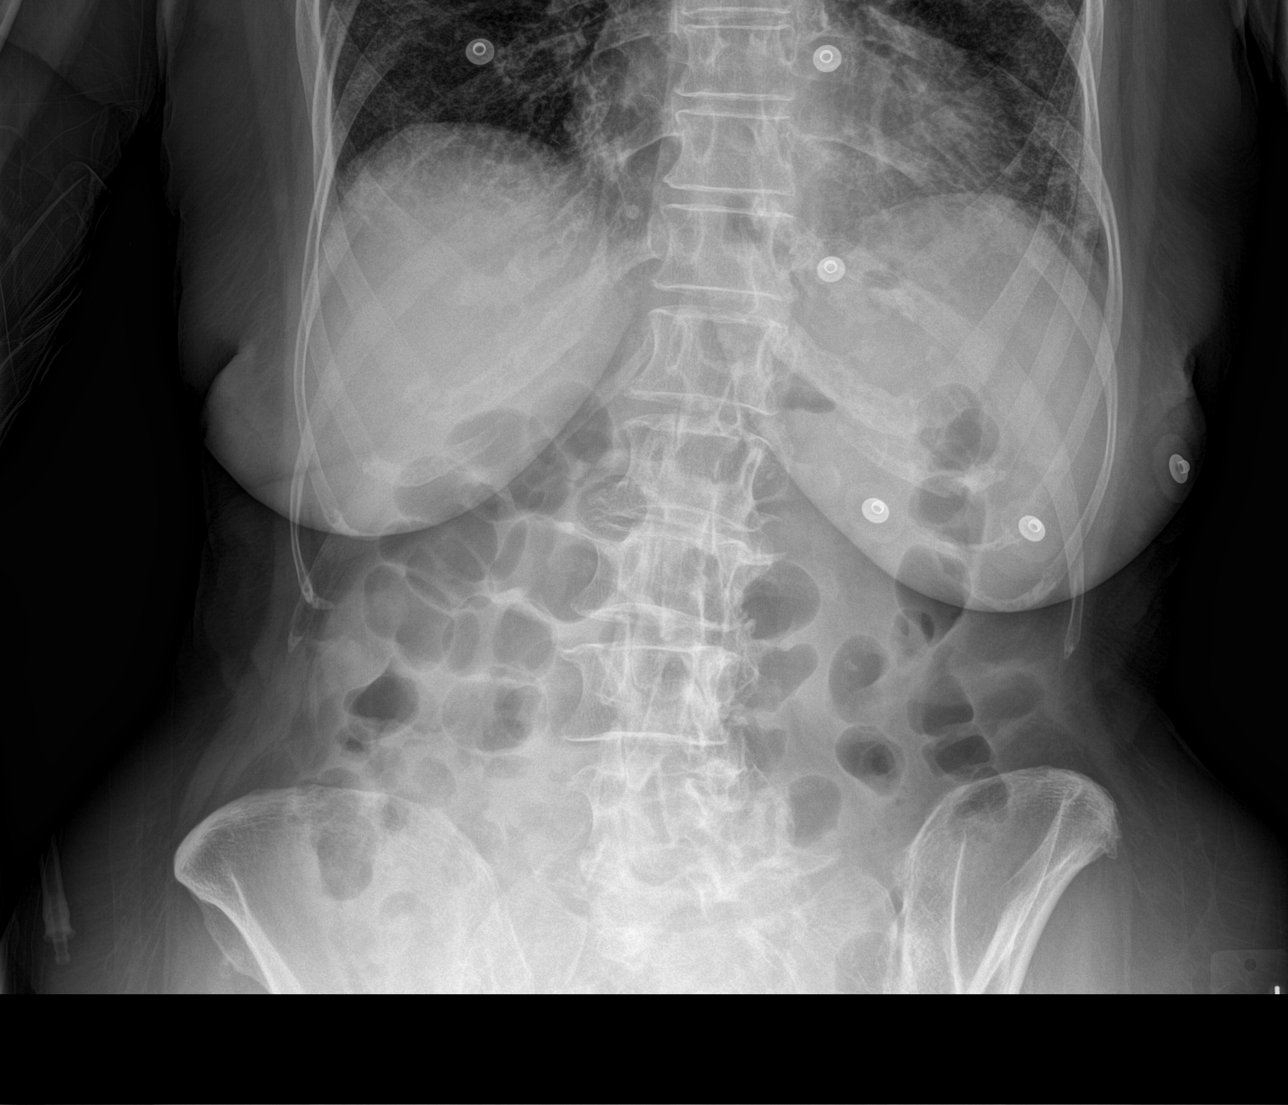

[abdomen supine]
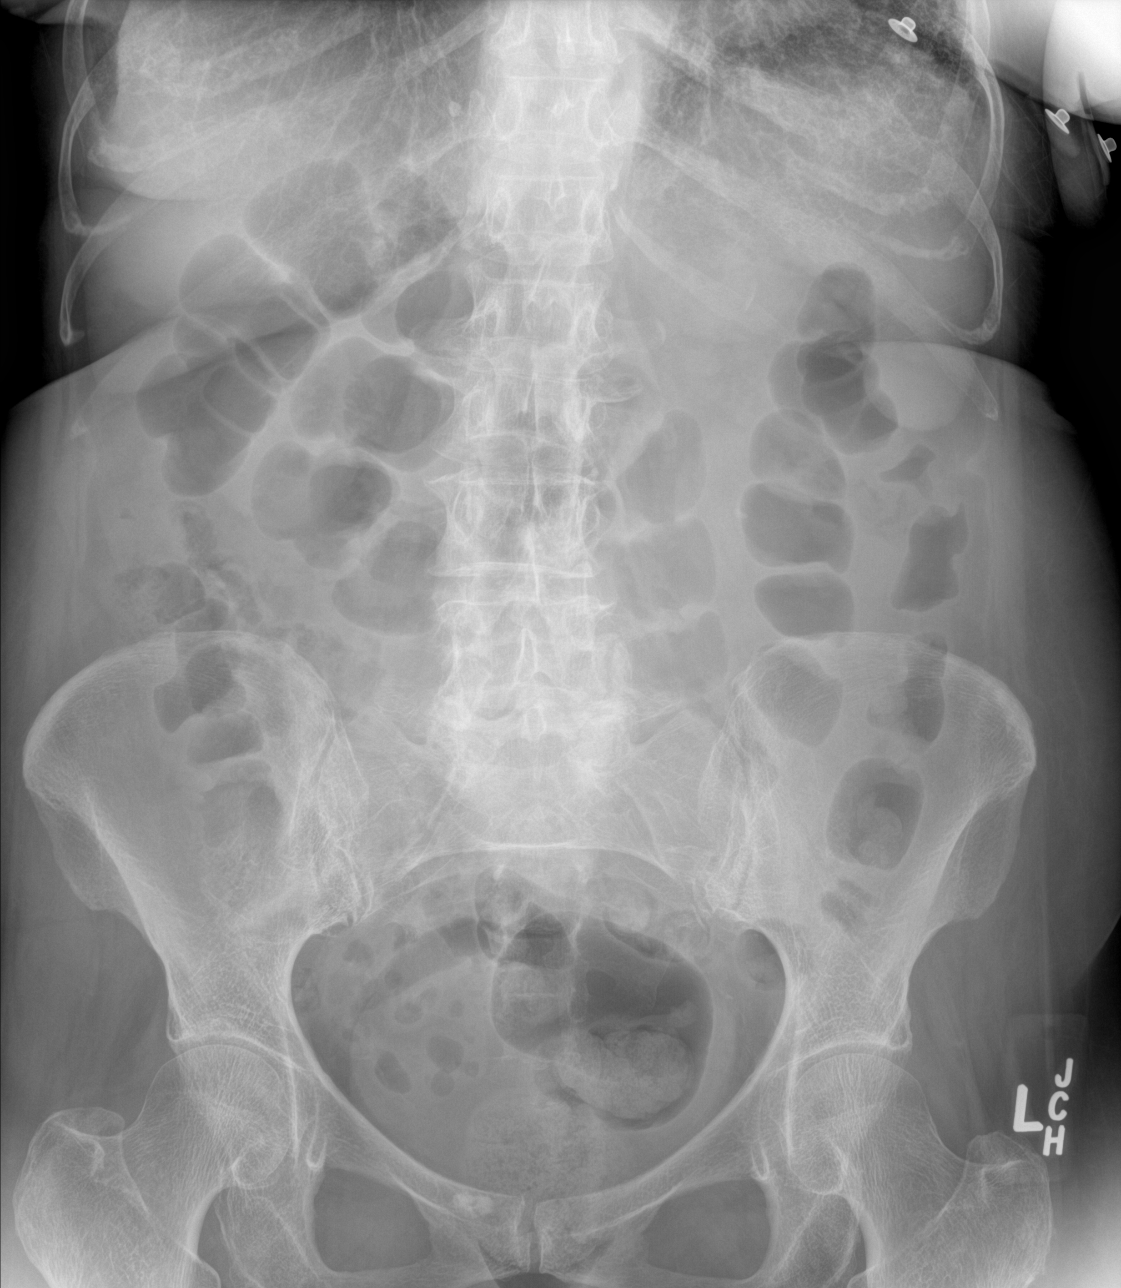

[3 of 3 positions shown; findings below may reference images not displayed]

FINDINGS: Fine reticulonodular peripheral interstitial accentuation in both
lungs. Biapical pleural parenchymal scarring. Mild cardiomegaly.

No free intraperitoneal gas beneath the hemidiaphragms. Scattered
gas and stool in the colon without a heavy stool burden. No dilated
small bowel. No significant abnormal air-fluid levels. Dextroconvex
lumbar scoliosis.
IMPRESSION: 1. Abnormal interstitial accentuation in both lungs, possibly a
manifestation of interstitial edema, atypical pneumonia, or drug
reaction. Mild associated enlargement of the cardiopericardial
silhouette
2. Bowel gas pattern unremarkable.

## 2016-03-29 ENCOUNTER — Other Ambulatory Visit: Payer: Self-pay | Admitting: Internal Medicine

## 2016-04-02 ENCOUNTER — Ambulatory Visit: Payer: Medicare Other | Admitting: Internal Medicine

## 2016-04-17 ENCOUNTER — Ambulatory Visit (INDEPENDENT_AMBULATORY_CARE_PROVIDER_SITE_OTHER): Payer: Medicare Other | Admitting: Cardiovascular Disease

## 2016-04-17 ENCOUNTER — Telehealth: Payer: Self-pay | Admitting: Cardiovascular Disease

## 2016-04-17 ENCOUNTER — Encounter: Payer: Self-pay | Admitting: Cardiovascular Disease

## 2016-04-17 VITALS — BP 110/56 | HR 59 | Ht 62.0 in | Wt 141.0 lb

## 2016-04-17 DIAGNOSIS — I6523 Occlusion and stenosis of bilateral carotid arteries: Secondary | ICD-10-CM | POA: Diagnosis not present

## 2016-04-17 DIAGNOSIS — I150 Renovascular hypertension: Secondary | ICD-10-CM | POA: Diagnosis not present

## 2016-04-17 DIAGNOSIS — I2510 Atherosclerotic heart disease of native coronary artery without angina pectoris: Secondary | ICD-10-CM | POA: Diagnosis not present

## 2016-04-17 DIAGNOSIS — E785 Hyperlipidemia, unspecified: Secondary | ICD-10-CM

## 2016-04-17 DIAGNOSIS — I701 Atherosclerosis of renal artery: Secondary | ICD-10-CM | POA: Diagnosis not present

## 2016-04-17 NOTE — Patient Instructions (Signed)
Dr Croitoru recommends that you schedule a follow-up appointment in 12 months. You will receive a reminder letter in the mail two months in advance. If you don't receive a letter, please call our office to schedule the follow-up appointment.  If you need a refill on your cardiac medications before your next appointment, please call your pharmacy. 

## 2016-04-17 NOTE — Telephone Encounter (Signed)
Acknowledged. Chart updated.

## 2016-04-17 NOTE — Telephone Encounter (Signed)
New message    Pt verbalized that she was just in the office and wants to speak to Dr.C nurse she want to let rn know that the Amlodipine 10mg   is 1 a day

## 2016-04-17 NOTE — Progress Notes (Signed)
Cardiology Office Note    Date:  04/17/2016   ID:  [name], DOB 1927/05/24, MRN [id number]  PCP:  Londell Moh, MD  Cardiologist:   Thurmon Fair, MD   Chief Complaint  Patient presents with  . Follow-up    patient reports no complaints    History of Present Illness:  [name] is a 82 y.o. female with long-standing history of peripheral arterial disease, status post bilateral carotid endarterectomy in 2012 and with high-grade right renal artery stenosis, managed medically. She also has documented asymptomatic coronary artery disease in secondary branches (80% posterolateral ventricular artery stenosis by catheterization 2005) also treated medically, per tension and hyperlipidemia.   Recently she has been seen Dr. Sherene Sires for a persistent cough. Her pulmonary function tests were reassuringly normal. 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 Her back problems have improved and she no longer needs a walker. She does not have angina pectoris, leg edema, claudication or exertional dyspnea. She denies syncope and palpitations. Her blood pressures consistently well controlled. She had lab tests performed earlier this year with Dr. Renne Crigler and they were "all okay".  She has normal left ventricular systolic function by echo performed in 2014. Carotid ultrasonography is followed at [facility] (bilateral carotid endarterectomy sites patent without significant stenosis has of October 2016.  Past Medical History:  Diagnosis Date  . Anemia   . Carotid arterial disease (HCC)    asymptomatic   . Chest tightness   . Coronary artery disease   . High blood pressure   . Leg pain    with walking  . Palpitations     Past Surgical History:  Procedure Laterality Date  . CARDIAC CATHETERIZATION  07/15/04   noncritical CAD,80% PLA treated medically. EF% 60. no RAS.  [name] CAROTID ENDARTERECTOMY     RIGHT=05/11/11, LEFT=03/30/11  . EYE SURGERY  2011   Cataract surgery Bilateral    . EYE SURGERY  2011   Bilateral eyelid surgery for ptosis  . renal artery duplex  02/23/13   ABDOMINAL AORTA:<50% DIAMETER REDUCTION. RIGHT RENAL ARTERY: 60-99% DIAMETER REDUCTION.[name] LEFT RENAL ARTERY:1-59% DIAMETER REDUCTION.    Current Medications: Outpatient Medications Prior to Visit  Medication Sig Dispense Refill  . AMLODIPINE BESYLATE PO Take 2 tablets by mouth daily. ? strength    . aspirin EC 81 MG tablet Take 1 tablet (81 mg total) by mouth daily. 30 tablet 11  . Calcium Carbonate-Vitamin D (CALCIUM PLUS VITAMIN D PO) Take 1 tablet by mouth daily.    . famotidine (PEPCID) 20 MG tablet TAKE 1 TABLET AT BEDTIME 30 tablet 2  . isosorbide mononitrate (IMDUR) 60 MG 24 hr tablet Take 60 mg by mouth every evening.     [name] losartan-hydrochlorothiazide (HYZAAR) 100-25 MG per tablet Take 1 tablet by mouth daily.    [name] MAGNESIUM OXIDE PO Take by mouth daily.    . metoprolol (LOPRESSOR) 100 MG tablet Take 100 mg by mouth 2 (two) times daily.     . pantoprazole (PROTONIX) 40 MG tablet Take 1 tablet (40 mg total) by mouth daily. Take 30-60 min before first meal of the day 90 tablet 2  . rOPINIRole (REQUIP) 1 MG tablet Take 1 mg by mouth 2 (two) times daily.      . rosuvastatin (CRESTOR) 10 MG tablet Take 10 mg by mouth daily.     No facility-administered medications prior to visit.      Allergies:   Cardura [doxazosin mesylate]; Lotensin [benazepril hcl];  and Tramadol   Social History   Social History  . Marital status: Widowed    Spouse name: N/A  . Number of children: N/A  . Years of education: N/A   Social History Main Topics  . Smoking status: Never Smoker  . Smokeless tobacco: Never Used  . Alcohol use No  . Drug use: No  . Sexual activity: Not Asked   Other Topics Concern  . None   Social History Narrative  . None     Family History:  The patient's family history includes Cancer in her sister; Heart disease in her brother and father; Lung disease in her mother.   ROS:    Please see the history of present illness.    ROS All other systems reviewed and are negative.   PHYSICAL EXAM:   VS:  BP (!) 110/56 (BP Location: Right Arm, Patient Position: Sitting, Cuff Size: Normal)   Pulse (!) 59   Ht 5\' 2"  (1.575 m)   Wt 64 kg (141 lb)   SpO2 99%   BMI 25.79 kg/m    GEN: Well nourished, well developed, in no acute distress  HEENT: normal  Neck: no JVD, Relatively mild right>left carotid bruits, no goiter or masses Cardiac: RRR; no murmurs, rubs, or gallops,no edema  Respiratory:  clear to auscultation bilaterally, normal work of breathing GI: soft, nontender, nondistended, + BS MS: no deformity or atrophy  Skin: warm and dry, no rash Neuro:  Alert and Oriented x 3, Strength and sensation are intact Psych: euthymic mood, full affect  Wt Readings from Last 3 Encounters:  04/17/16 64 kg (141 lb)  10/31/15 64 kg (141 lb 3.2 oz)  09/19/15 67.6 kg (149 lb)      Studies/Labs Reviewed:   EKG:  EKG is ordered today.  The ekg ordered today demonstrates Sinus bradycardia with first-degree AV block, no repolarization abnormalities, QTC 431 ms  Recent Labs: No results found for requested labs within last 8760 hours.    ASSESSMENT:    1. Right renal artery stenosis (HCC)   2. Carotid stenosis, bilateral   3. Renovascular hypertension   4. Hyperlipidemia   5. Coronary artery disease involving native coronary artery of native heart without angina pectoris      PLAN:  In order of problems listed above:  1. R Renal artery stenosis: Although this is a significant abnormality, it has not progressed on serial ultrasounds performed over several years. Blood pressure control is excellent and her renal function is stable. Will decrease frequency of duplex ultrasound scans to every other year (check in 2018) 2. S/P bilateral CEA: We'll also do the scans every other year (check in 2018) 3. HTN: Well controlled 4. HLP: We'll get labs from Dr. Renne CriglerPharr 5. CAD: She  does not have angina pectoris or ECG changes    Medication Adjustments/Labs and Tests Ordered: Current medicines are reviewed at length with the patient today.  Concerns regarding medicines are outlined above.  Medication changes, Labs and Tests ordered today are listed in the Patient Instructions below. Patient Instructions  Dr Royann Shiversroitoru recommends that you schedule a follow-up appointment in 12 months. You will receive a reminder letter in the mail two months in advance. If you don't receive a letter, please call our office to schedule the follow-up appointment.  If you need a refill on your cardiac medications before your next appointment, please call your pharmacy.    Signed, Thurmon FairMihai Bridney Guadarrama, MD  04/17/2016 10:19 AM    [facility] [place], [place], [place]  [address] Phone: [phone]; Fax: [phone]

## 2016-04-30 DIAGNOSIS — Z23 Encounter for immunization: Secondary | ICD-10-CM | POA: Diagnosis not present

## 2016-05-19 ENCOUNTER — Ambulatory Visit (INDEPENDENT_AMBULATORY_CARE_PROVIDER_SITE_OTHER): Payer: Medicare Other | Admitting: Internal Medicine

## 2016-05-19 ENCOUNTER — Encounter: Payer: Self-pay | Admitting: Internal Medicine

## 2016-05-19 ENCOUNTER — Encounter (INDEPENDENT_AMBULATORY_CARE_PROVIDER_SITE_OTHER): Payer: Medicare Other | Admitting: Internal Medicine

## 2016-05-19 VITALS — BP 134/70 | HR 60 | Ht 62.0 in | Wt 138.0 lb

## 2016-05-19 DIAGNOSIS — J8410 Pulmonary fibrosis, unspecified: Secondary | ICD-10-CM | POA: Diagnosis not present

## 2016-05-19 DIAGNOSIS — I6523 Occlusion and stenosis of bilateral carotid arteries: Secondary | ICD-10-CM | POA: Diagnosis not present

## 2016-05-19 LAB — PULMONARY FUNCTION TEST
DL/VA % pred: 61 %
DL/VA: 2.81 ml/min/mmHg/L
DLCO cor % pred: 42 %
DLCO cor: 9.26 ml/min/mmHg
DLCO unc % pred: 41 %
DLCO unc: 9.05 ml/min/mmHg
FEF 25-75 Post: 2.2 L/sec
FEF 25-75 Pre: 1.48 L/sec
FEF2575-%Change-Post: 49 %
FEF2575-%Pred-Post: 247 %
FEF2575-%Pred-Pre: 165 %
FEV1-%Change-Post: 1 %
FEV1-%Pred-Post: 111 %
FEV1-%Pred-Pre: 110 %
FEV1-Post: 1.64 L
FEV1-Pre: 1.62 L
FEV1FVC-%Change-Post: 3 %
FEV1FVC-%Pred-Pre: 116 %
FEV6-%Change-Post: -1 %
FEV6-%Pred-Post: 100 %
FEV6-%Pred-Pre: 102 %
FEV6-Post: 1.9 L
FEV6-Pre: 1.93 L
FEV6FVC-%Pred-Post: 107 %
FEV6FVC-%Pred-Pre: 107 %
FVC-%Change-Post: -1 %
FVC-%Pred-Post: 93 %
FVC-%Pred-Pre: 95 %
FVC-Post: 1.9 L
FVC-Pre: 1.93 L
Post FEV1/FVC ratio: 87 %
Post FEV6/FVC ratio: 100 %
Pre FEV1/FVC ratio: 84 %
Pre FEV6/FVC Ratio: 100 %
RV % pred: 58 %
RV: 1.42 L
TLC % pred: 69 %
TLC: 3.31 L

## 2016-05-19 NOTE — Patient Instructions (Addendum)
No change in medications for now   Please schedule a follow up visit in 6  months but call sooner if needed with cxr on return 

## 2016-05-19 NOTE — Progress Notes (Signed)
Subjective:    Patient ID: [name], female    DOB: 12/24/1926,    MRN: [id number]    Brief patient profile:  [age] yowf never smoker referred to pulmonary clinic 12/25/14 by Dr Renne CriglerPharr with abn CT chest c/w UIP    History of Present Illness  12/25/2014 1st [facility] office visit/ Ramondo Dietze   Chief Complaint  Patient presents with  . Pulmonary Consult    Referred by Dr. Merri BrunetteWalter Pharr for eval of abnormal cxr. Pt states she has had cough and DOE on and off for "years". Her cough is non prod.   onset cough /sob was insidious, minimally progressive severity x sev years with no h/o rhematism or chemo/ amio exposure  Cough is worse with exertion, no relation to meals and no h/o dysphagia, not noct/ always dry. rec gerd diet / f/u q 6 m   09/19/2015  f/u ov/Donielle Radziewicz re: PF Chief Complaint  Patient presents with  . Follow-up    Cough is unchanged. She states her breathing is fine and "I didn't know I had a breathing problem".   cough is dry/ daytime sporadic, no pattern at all  rec GERD  Pantoprazole (protonix) 40 mg  Take  30-60 min before first meal of the day and Pepcid (famotidine)  20 mg one @  bedtime until return to office     10/31/2015  f/u ov/Laverle Pillard re: Mild PF/ cough resolved on gerd rx/ stopped p one month rx and no relapse Chief Complaint  Patient presents with  . Follow-up    Breathing is doing well. She denies any co's today.   Not limited by breathing from desired activities  But by knees  rec For any respiratory symptoms that flare > Pantoprazole (protonix) 40 mg   Take  30-60 min before first meal of the day and Pepcid (famotidine)  20 mg one @  bedtime until return to office - this is the best way to tell whether stomach acid is contributing to your problem.   Stick with the diet also     05/19/2016  f/u ov/Najat Olazabal re: PF/ on gerd diet/ ppi q am / pepcid 20 mg  Chief Complaint  Patient presents with  . Follow-up    PFT's done today. Breathing is doing well and her  cough is unchanged. No new co's today.    Not limited by breathing from desired activities  /minimal dry cough day > noct   No obvious day to day or daytime variability or assoc cp or chest tightness, subjective wheeze or overt sinus or hb symptoms. No unusual exp hx or h/o childhood pna/ asthma or knowledge of premature birth.  Sleeping ok without nocturnal  or early am exacerbation  of respiratory  c/o's or need for noct saba. Also denies any obvious fluctuation of symptoms with weather or environmental changes or other aggravating or alleviating factors except as outlined above   Current Medications, Allergies, Complete Past Medical History, Past Surgical History, Family History, and Social History were reviewed in [facility] electronic medical record.  ROS  The following are not active complaints unless bolded sore throat, dysphagia, dental problems, itching, sneezing,  nasal congestion or excess/ purulent secretions, ear ache,   fever, chills, sweats, unintended wt loss, classically pleuritic or exertional cp, hemoptysis,  orthopnea pnd or leg swelling, presyncope, palpitations, abdominal pain, anorexia, nausea, vomiting, diarrhea  or change in bowel or bladder habits, change in stools or urine, dysuria,hematuria,  rash, arthralgias, visual complaints, headache,  numbness, weakness or ataxia or problems with walking or coordination,  change in mood/affect or memory.               Objective:   Physical Exam  amb wf nad  Minimal throat clearing   05/19/2016       138  10/31/2015        141   09/19/2015         144   12/25/14 143 lb 6.4 oz (65.046 kg)  09/24/14 150 lb (68.04 kg)  03/21/14 153 lb 14.4 oz (69.809 kg)    Vital signs reviewed - Note on arrival 02 sats   100% on RA     HEENT: nl dentition, turbinates, and orophanx. Nl external ear canals without cough reflex   NECK :  without JVD/Nodes/TM/ nl carotid upstrokes bilaterally   LUNGS: no acc muscle use, classic velcro  insp crackles both bases s cough on inspiration    CV:  RRR  no s3 or murmur or increase in P2, no edema   ABD:  soft and nontender with nl excursion in the supine position. No bruits or organomegaly, bowel sounds nl  MS:  warm without deformities, calf tenderness, cyanosis or clubbing  SKIN: warm and dry without lesions    NEURO:  alert, approp, no deficits     I personally reviewed images and agree with radiology impression as follows:  CTachest:  12/11/14 1. No evidence of acute pulmonary embolism. 2. Fibrotic changes particularly in the lung bases posteriorly most consistent with UIP. 3. Calcified granulomas consistent with prior granulomatous disease.        Assessment & Plan:

## 2016-05-19 NOTE — Assessment & Plan Note (Signed)
-   present on CT chest 12/29/2010   - 12/25/2014  Walked RA x 3 laps @ 185 ft each stopped due to  End of study, nl sats, min sob  - PFTs  02/06/2015  VC 1.96 (94%) s obst and dlco 47 corrects to 66% > rec ov 6 m to repeat walking sats call sooner if losing ground - PFTs 09/18/15         VC 1.93(93%) s obst and dlco 38/39c correctst to 68%  - 09/19/2015   Walked RA  2 laps @ 185 ft each stopped due to  Fatigue/ sats 88% at moderate pace  - 10/09/15 rx gerd diet/ ppi/h2 hs  -  PFT's  05/19/2016  VC  1.89  (93%)  with DLCO  41/42 % corrects to 61  % for alv volume - 05/19/2016   Walked RA  2 laps @ 185 ft each stopped due to fatigue/ sats 94% moderate pace    Clearly has stabilized with rx just directed at GERD  Discussed in detail all the  indications, usual  risks and alternatives  relative to the benefits with patient who agrees to proceed with conservative f/u as outlined with continued max rx for gerd/ f/u q 6 m  Each maintenance medication was reviewed in detail including most importantly the difference between maintenance and as needed and under what circumstances the prns are to be used.  Please see instructions for details which were reviewed in writing and the patient given a copy.    15/25 min of visit spent on counseling

## 2016-06-25 ENCOUNTER — Other Ambulatory Visit: Payer: Self-pay | Admitting: Internal Medicine

## 2016-08-24 ENCOUNTER — Other Ambulatory Visit: Payer: Self-pay | Admitting: Internal Medicine

## 2016-08-24 DIAGNOSIS — J8410 Pulmonary fibrosis, unspecified: Secondary | ICD-10-CM

## 2016-11-13 ENCOUNTER — Other Ambulatory Visit: Payer: Self-pay

## 2016-11-13 DIAGNOSIS — J8410 Pulmonary fibrosis, unspecified: Secondary | ICD-10-CM

## 2016-11-16 ENCOUNTER — Ambulatory Visit (INDEPENDENT_AMBULATORY_CARE_PROVIDER_SITE_OTHER)
Admission: RE | Admit: 2016-11-16 | Discharge: 2016-11-16 | Disposition: A | Discharge status: Home / Self Care | Payer: Medicare Other | Source: Ambulatory Visit | Attending: Internal Medicine | Admitting: Internal Medicine

## 2016-11-16 ENCOUNTER — Ambulatory Visit (INDEPENDENT_AMBULATORY_CARE_PROVIDER_SITE_OTHER): Payer: Medicare Other | Admitting: Internal Medicine

## 2016-11-16 ENCOUNTER — Encounter: Payer: Self-pay | Admitting: Internal Medicine

## 2016-11-16 VITALS — BP 128/62 | HR 54 | Ht 62.0 in | Wt 139.0 lb

## 2016-11-16 DIAGNOSIS — J8410 Pulmonary fibrosis, unspecified: Secondary | ICD-10-CM | POA: Diagnosis not present

## 2016-11-16 NOTE — Assessment & Plan Note (Signed)
-   present on CT chest 12/29/2010   - 12/25/2014  Walked RA x 3 laps @ 185 ft each stopped due to  End of study, nl sats, min sob  - PFTs  02/06/2015  VC 1.96 (94%) s obst and dlco 47 corrects to 66% > rec ov 6 m to repeat walking sats call sooner if losing ground - PFTs 09/18/15         VC 1.93(93%) s obst and dlco 38/39c correctst to 68%  - 09/19/2015   Walked RA  2 laps @ 185 ft each stopped due to  Fatigue/ sats 88% at moderate pace  - 10/09/15 rx gerd diet/ ppi/h2 hs  -  PFT's  05/19/2016  VC  1.89  (93%)  with DLCO  41/42 % corrects to 61  % for alv volume - 05/19/2016   Walked RA  2 laps @ 185 ft each stopped due to fatigue/ sats 94% moderate pace    - 11/16/2016  Walked RA x 3 laps @ 185 ft each stopped due to  End of study, nl pace, no sob or desat   At nl pace    Has clearly stabilized on gerd rx (no cough now) only and not limited at all from desired activities so no need to change rx  Discussed the recent press about ppi's in the context of a statistically significant (but questionably clinically relevant) increase in CRI in pts on ppi vs h2's > bottom line is the lowest dose of ppi that we can use is the best dose and given her improvement in symptoms since starting it and the cost relative to benefit fo the PF drugs on the market would rec she continue Pantoprazole (protonix) 40 mg   Take  30-60 min before first meal of the day and Pepcid (famotidine)  20 mg one @  bedtime until return to office in one year  Discussed in detail all the  indications, usual  risks and alternatives  relative to the benefits with patient who agrees to proceed with conservative f/u as outlined   I had an extended discussion with the patient reviewing all relevant studies completed to date and  lasting 15 to 20 minutes of a 25 minute visit    Each maintenance medication was reviewed in detail including most importantly the difference between maintenance and prns and under what circumstances the prns are to be  triggered using an action plan format that is not reflected in the computer generated alphabetically organized AVS.    Please see AVS for specific instructions unique to this visit that I personally wrote and verbalized to the the pt in detail and then reviewed with pt  by my nurse highlighting any  changes in therapy recommended at today's visit to their plan of care.

## 2016-11-16 NOTE — Progress Notes (Signed)
Subjective:    Patient ID: [name], female    DOB: May 25, 1927     MRN: [id number]    Brief patient profile:  42 yowf never smoker referred to pulmonary clinic 12/25/14 by Dr Renne Crigler with abn CT chest c/w UIP    History of Present Illness  12/25/2014 1st [facility] office visit/ [name]   Chief Complaint  Patient presents with  . Pulmonary Consult    Referred by Dr. [name] for eval of abnormal cxr. Pt states she has had cough and DOE on and off for "years". Her cough is non prod.   onset cough /sob was insidious, minimally progressive severity x sev years with no h/o rhematism or chemo/ amio exposure  Cough is worse with exertion, no relation to meals and no h/o dysphagia, not noct/ always dry. rec gerd diet / f/u q 6 m   09/19/2015  f/u ov/[name] re: PF Chief Complaint  Patient presents with  . Follow-up    Cough is unchanged. She states her breathing is fine and "I didn't know I had a breathing problem".   cough is dry/ daytime sporadic, no pattern at all  rec GERD  Pantoprazole (protonix) 40 mg  Take  30-60 min before first meal of the day and Pepcid (famotidine)  20 mg one @  bedtime until return to office     10/31/2015  f/u ov/[name] re: Mild PF/ cough resolved on gerd rx/ stopped p one month rx and no relapse Chief Complaint  Patient presents with  . Follow-up    Breathing is doing well. She denies any co's today.   Not limited by breathing from desired activities  But by knees  rec For any respiratory symptoms that flare > Pantoprazole (protonix) 40 mg   Take  30-60 min before first meal of the day and Pepcid (famotidine)  20 mg one @  bedtime until return to office - this is the best way to tell whether stomach acid is contributing to your problem.   Stick with the diet also     05/19/2016  f/u ov/[name] re: PF/ on gerd diet/ ppi q am / pepcid 20 mg  Chief Complaint  Patient presents with  . Follow-up    PFT's done today. Breathing is doing well and her  cough is unchanged. No new co's today.    Not limited by breathing from desired activities  /minimal dry cough day > noct rec No change in medications for now Please schedule a follow up visit in 6 months but call sooner if needed with cxr on return     11/16/2016  f/u ov/[name] re:   PPI / on gerd diet/ ppi/h2hs only / no 02  Chief Complaint  Patient presents with  . Follow-up    CXR done today. Breathing is doing well and no new co's today.     Lots of gardening / lives at [facility] independent  [facility] about a half a block to cafeteria nl pace s sob / no cough   No obvious day to day or daytime variability or assoc excess/ purulent sputum or mucus plugs or hemoptysis or cp or chest tightness, subjective wheeze or overt sinus or hb symptoms. No unusual exp hx or h/o childhood pna/ asthma or knowledge of premature birth.  Sleeping ok without nocturnal  or early am exacerbation  of respiratory  c/o's or need for noct saba. Also denies any obvious fluctuation of symptoms with weather or environmental changes  or other aggravating or alleviating factors except as outlined above   Current Medications, Allergies, Complete Past Medical History, Past Surgical History, Family History, and Social History were reviewed in [employer] [facility] record.  ROS  The following are not active complaints unless bolded sore throat, dysphagia, dental problems, itching, sneezing,  nasal congestion or excess/ purulent secretions, ear ache,   fever, chills, sweats, unintended wt loss, classically pleuritic or exertional cp,  orthopnea pnd or leg swelling, presyncope, palpitations, abdominal pain, anorexia, nausea, vomiting, diarrhea  or change in bowel or bladder habits, change in stools or urine, dysuria,hematuria,  rash, arthralgias, visual complaints, headache, numbness, weakness or ataxia or problems with walking or coordination,  change in mood/affect or memory.                       Objective:   Physical Exam  amb wf nad     11/16/2016        139 05/19/2016      138  10/31/2015        141   09/19/2015         144   12/25/14 143 lb 6.4 oz (65.046 kg)  09/24/14 150 lb (68.04 kg)  03/21/14 153 lb 14.4 oz (69.809 kg)    Vital signs reviewed -  - Note on arrival 02 sats  97% on RA     HEENT: nl dentition, turbinates, and orophanx. Nl external ear canals without cough reflex   NECK :  without JVD/Nodes/TM/ nl carotid upstrokes bilaterally   LUNGS: no acc muscle use, classic soft late insp velcro crackles R > L base    CV:  RRR  no s3 or murmur or increase in P2, no edema   ABD:  soft and nontender with nl excursion in the supine position. No bruits or organomegaly, bowel sounds nl  MS:  warm without deformities, calf tenderness, cyanosis or clubbing  SKIN: warm and dry without lesions    NEURO:  alert, approp, no deficits      CXR PA and Lateral:   11/16/2016 :    I personally reviewed images and agree with radiology impression as follows:    Mild pulmonary fibrotic change not greatly changed from the previous study. No acute cardiopulmonary abnormality.         Assessment & Plan:

## 2016-11-16 NOTE — Patient Instructions (Signed)
No change medications   Please schedule a follow up visit in 12  months but call sooner if needed    

## 2016-11-19 ENCOUNTER — Other Ambulatory Visit: Payer: Self-pay | Admitting: Internal Medicine

## 2016-11-19 DIAGNOSIS — J8410 Pulmonary fibrosis, unspecified: Secondary | ICD-10-CM

## 2017-01-18 ENCOUNTER — Other Ambulatory Visit: Payer: Self-pay | Admitting: Internal Medicine

## 2017-02-17 ENCOUNTER — Other Ambulatory Visit: Payer: Self-pay | Admitting: Internal Medicine

## 2017-02-17 DIAGNOSIS — J8410 Pulmonary fibrosis, unspecified: Secondary | ICD-10-CM

## 2017-02-24 DIAGNOSIS — L570 Actinic keratosis: Secondary | ICD-10-CM | POA: Diagnosis not present

## 2017-02-24 DIAGNOSIS — L821 Other seborrheic keratosis: Secondary | ICD-10-CM | POA: Diagnosis not present

## 2017-02-24 DIAGNOSIS — Z85828 Personal history of other malignant neoplasm of skin: Secondary | ICD-10-CM | POA: Diagnosis not present

## 2017-03-12 DIAGNOSIS — I701 Atherosclerosis of renal artery: Secondary | ICD-10-CM | POA: Diagnosis not present

## 2017-03-12 DIAGNOSIS — R634 Abnormal weight loss: Secondary | ICD-10-CM | POA: Diagnosis not present

## 2017-03-12 DIAGNOSIS — I2510 Atherosclerotic heart disease of native coronary artery without angina pectoris: Secondary | ICD-10-CM | POA: Diagnosis not present

## 2017-03-12 DIAGNOSIS — R5383 Other fatigue: Secondary | ICD-10-CM | POA: Diagnosis not present

## 2017-03-12 DIAGNOSIS — N390 Urinary tract infection, site not specified: Secondary | ICD-10-CM | POA: Diagnosis not present

## 2017-03-12 DIAGNOSIS — I959 Hypotension, unspecified: Secondary | ICD-10-CM | POA: Diagnosis not present

## 2017-03-18 DIAGNOSIS — I10 Essential (primary) hypertension: Secondary | ICD-10-CM | POA: Diagnosis not present

## 2017-03-18 DIAGNOSIS — N390 Urinary tract infection, site not specified: Secondary | ICD-10-CM | POA: Diagnosis not present

## 2017-04-23 ENCOUNTER — Other Ambulatory Visit: Payer: Self-pay | Admitting: Internal Medicine

## 2017-04-23 MED ORDER — FAMOTIDINE 20 MG PO TABS: 20.0000 mg | ORAL_TABLET | Freq: Every day | ORAL | 1 refills | Status: DC

## 2017-04-26 ENCOUNTER — Encounter (HOSPITAL_COMMUNITY): Payer: Self-pay

## 2017-04-26 ENCOUNTER — Emergency Department (HOSPITAL_COMMUNITY)
Admission: EM | Admit: 2017-04-26 | Discharge: 2017-04-26 | Disposition: A | Discharge status: Home / Self Care | Payer: Medicare Other | Attending: Emergency Medicine | Admitting: Emergency Medicine

## 2017-04-26 DIAGNOSIS — Z7982 Long term (current) use of aspirin: Secondary | ICD-10-CM | POA: Insufficient documentation

## 2017-04-26 DIAGNOSIS — Z79899 Other long term (current) drug therapy: Secondary | ICD-10-CM | POA: Diagnosis not present

## 2017-04-26 DIAGNOSIS — I10 Essential (primary) hypertension: Secondary | ICD-10-CM | POA: Diagnosis not present

## 2017-04-26 DIAGNOSIS — R51 Headache: Secondary | ICD-10-CM | POA: Diagnosis not present

## 2017-04-26 DIAGNOSIS — I2510 Atherosclerotic heart disease of native coronary artery without angina pectoris: Secondary | ICD-10-CM | POA: Diagnosis not present

## 2017-04-26 DIAGNOSIS — I158 Other secondary hypertension: Secondary | ICD-10-CM

## 2017-04-26 LAB — COMPREHENSIVE METABOLIC PANEL
ALT: 15 U/L (ref 14–54)
AST: 20 U/L (ref 15–41)
Albumin: 4.3 g/dL (ref 3.5–5.0)
Alkaline Phosphatase: 76 U/L (ref 38–126)
Anion gap: 11 (ref 5–15)
BUN: 15 mg/dL (ref 6–20)
CO2: 29 mmol/L (ref 22–32)
Calcium: 10.2 mg/dL (ref 8.9–10.3)
Chloride: 100 mmol/L — ABNORMAL LOW (ref 101–111)
Creatinine, Ser: 1.03 mg/dL — ABNORMAL HIGH (ref 0.44–1.00)
GFR calc Af Amer: 55 mL/min — ABNORMAL LOW (ref >60)
GFR calc non Af Amer: 47 mL/min — ABNORMAL LOW (ref >60)
Glucose, Bld: 105 mg/dL — ABNORMAL HIGH (ref 65–99)
Potassium: 4.2 mmol/L (ref 3.5–5.1)
Sodium: 140 mmol/L (ref 135–145)
Total Bilirubin: 0.8 mg/dL (ref 0.3–1.2)
Total Protein: 6.9 g/dL (ref 6.5–8.1)

## 2017-04-26 LAB — CBC WITH DIFFERENTIAL/PLATELET
Basophils Absolute: 0 10*3/uL (ref 0.0–0.1)
Basophils Relative: 0 %
Eosinophils Absolute: 0.3 10*3/uL (ref 0.0–0.7)
Eosinophils Relative: 4 %
HCT: 45.7 % (ref 36.0–46.0)
Hemoglobin: 14.6 g/dL (ref 12.0–15.0)
Lymphocytes Relative: 18 %
Lymphs Abs: 1.4 10*3/uL (ref 0.7–4.0)
MCH: 29.3 pg (ref 26.0–34.0)
MCHC: 31.9 g/dL (ref 30.0–36.0)
MCV: 91.8 fL (ref 78.0–100.0)
Monocytes Absolute: 0.5 10*3/uL (ref 0.1–1.0)
Monocytes Relative: 6 %
Neutro Abs: 5.6 10*3/uL (ref 1.7–7.7)
Neutrophils Relative %: 72 %
Platelets: 174 10*3/uL (ref 150–400)
RBC: 4.98 MIL/uL (ref 3.87–5.11)
RDW: 14.5 % (ref 11.5–15.5)
WBC: 7.8 10*3/uL (ref 4.0–10.5)

## 2017-04-26 MED ORDER — ACETAMINOPHEN 500 MG PO TABS
1000.0000 mg | ORAL_TABLET | Freq: Once | ORAL | Status: AC
  Administered 2017-04-26: 1000 mg via ORAL
  Filled 2017-04-26: qty 2

## 2017-04-26 NOTE — ED Notes (Signed)
Pt ambulatory to restroom with no problems  

## 2017-04-26 NOTE — ED Triage Notes (Signed)
Pt reports elevated BP today, 186 systolic at home today. She also reports having a headache earlier in the day. Hypertensive in triage, denies headache currently. Pt AOX4.

## 2017-04-26 NOTE — ED Provider Notes (Signed)
[facility]ENC[facility] DEPT Provider Note   CSN: [id number] Arrival date & time: 04/26/17  1539     History   Chief Complaint Chief Complaint  Patient presents with  . Hypertension    HPI [name] is a 83 y.o. female.  The history is provided by the patient.  Hypertension  This is a chronic problem. Episode onset: Several years. The problem occurs constantly. Progression since onset: Fluctuating. Associated symptoms include headaches. Pertinent negatives include no chest pain, no abdominal pain and no shortness of breath (Mild frontal headache noted today; significant improvement since onset.). Nothing aggravates the symptoms. Nothing relieves the symptoms.    Dr. Carolee Rota PA took patient off of Norvasc and Losartan 3 weeks ago for unknown reason to the patient. Still on Metop.   Past Medical History:  Diagnosis Date  . Anemia   . Carotid arterial disease (HCC)    asymptomatic   . Chest tightness   . Coronary artery disease   . High blood pressure   . Leg pain    with walking  . Palpitations     Patient Active Problem List   Diagnosis Date Noted  . Renovascular hypertension 04/17/2016  . Essential hypertension 12/26/2014  . Postinflammatory pulmonary fibrosis (HCC) 12/25/2014  . Tachycardia 12/13/2014  . Aftercare following surgery of the circulatory system, NEC 11/28/2013  . CAD (coronary artery disease) 09/21/2013  . Right renal artery stenosis (HCC) 09/21/2013  . Hyperlipidemia 09/21/2013  . First degree AV block 09/21/2013  . Carotid stenosis 11/24/2011    Past Surgical History:  Procedure Laterality Date  . CARDIAC CATHETERIZATION  07/15/04   noncritical CAD,80% PLA treated medically. EF% 60. no RAS.  [name] CAROTID ENDARTERECTOMY     RIGHT=05/11/11, LEFT=03/30/11  . EYE SURGERY  2011   Cataract surgery Bilateral  . EYE SURGERY  2011   Bilateral eyelid surgery for ptosis  . renal artery duplex  02/23/13   ABDOMINAL AORTA:<50% DIAMETER REDUCTION. RIGHT  RENAL ARTERY: 60-99% DIAMETER REDUCTION.[name] LEFT RENAL ARTERY:1-59% DIAMETER REDUCTION.    OB History    No data available       Home Medications    Prior to Admission medications   Medication Sig Start Date End Date Taking? Authorizing Provider  aspirin EC 81 MG tablet Take 1 tablet (81 mg total) by mouth daily. 04/18/15  Yes Croitoru, Mihai, MD  calcium carbonate (CALCIUM 600) 600 MG TABS tablet Take 1,200-1,500 mg by mouth daily.   Yes [provider]  Cholecalciferol (VITAMIN D-3) 1000 units CAPS Take 1,000 Units by mouth daily.   Yes [provider]  isosorbide mononitrate (IMDUR) 60 MG 24 hr tablet Take 60 mg by mouth daily.    Yes [provider]  Magnesium 500 MG TABS Take 500 mg by mouth daily.   Yes [provider]  metoprolol (LOPRESSOR) 100 MG tablet Take 100 mg by mouth 2 (two) times daily.  03/07/14  Yes [provider]  pantoprazole (PROTONIX) 40 MG tablet TAKE 1 TABLET (40 MG TOTAL) BY MOUTH DAILY. TAKE 30-60 MIN BEFORE FIRST MEAL OF THE DAY 02/17/17  Yes Nyoka Cowden, MD  rOPINIRole (REQUIP) 1 MG tablet Take 1 mg by mouth daily with lunch.    Yes [provider]  rOPINIRole (REQUIP) 2 MG tablet Take 2 mg by mouth at bedtime. 04/21/17  Yes [provider]  rosuvastatin (CRESTOR) 10 MG tablet Take 10 mg by mouth at bedtime.    Yes [provider]  famotidine (PEPCID)  20 MG tablet Take 1 tablet (20 mg total) by mouth at bedtime. 04/23/17   Nyoka Cowden, MD    Family History Family History  Problem Relation Age of Onset  . Lung disease Mother   . Heart disease Father   . Cancer Sister        liver cancer  . Heart disease Brother     Social History Social History  Substance Use Topics  . Smoking status: Never Smoker  . Smokeless tobacco: Never Used  . Alcohol use No     Allergies   Cardura [doxazosin mesylate]; Lotensin [benazepril hcl]; and Tramadol   Review of Systems Review of  Systems  Respiratory: Negative for shortness of breath (Mild frontal headache noted today; significant improvement since onset.).   Cardiovascular: Negative for chest pain.  Gastrointestinal: Negative for abdominal pain.  Neurological: Positive for headaches.   All other systems are reviewed and are negative for acute change except as noted in the HPI   Physical Exam Updated Vital Signs BP (!) 214/108   Pulse 69   Temp 98.1 F (36.7 C) (Oral)   Resp 19   SpO2 96%   Physical Exam  Constitutional: She is oriented to person, place, and time. She appears well-developed and well-nourished. No distress.  HENT:  Head: Normocephalic and atraumatic.  Nose: Nose normal.  Eyes: Pupils are equal, round, and reactive to light. Conjunctivae and EOM are normal. Right eye exhibits no discharge. Left eye exhibits no discharge. No scleral icterus.  Neck: Normal range of motion. Neck supple.  Cardiovascular: Normal rate and regular rhythm.  Exam reveals no gallop and no friction rub.   No murmur heard. Pulmonary/Chest: Effort normal and breath sounds normal. No stridor. No respiratory distress. She has no rales.  Abdominal: Soft. She exhibits no distension. There is no tenderness.  Musculoskeletal: She exhibits no edema or tenderness.  Neurological: She is alert and oriented to person, place, and time.  Skin: Skin is warm and dry. No rash noted. She is not diaphoretic. No erythema.  Psychiatric: She has a normal mood and affect.  Vitals reviewed.    ED Treatments / Results  Labs (all labs ordered are listed, but only abnormal results are displayed) Labs Reviewed  COMPREHENSIVE METABOLIC PANEL - Abnormal; Notable for the following:       Result Value   Chloride 100 (*)    Glucose, Bld 105 (*)    Creatinine, Ser 1.03 (*)    GFR calc non Af Amer 47 (*)    GFR calc Af Amer 55 (*)    All other components within normal limits  CBC WITH DIFFERENTIAL/PLATELET    EKG  EKG  Interpretation  Date/Time:  Monday April 26 2017 17:55:18 EDT Ventricular Rate:  71 PR Interval:    QRS Duration: 108 QT Interval:  417 QTC Calculation: 454 R Axis:   -73 Text Interpretation:  Sinus rhythm Ventricular premature complex Prolonged PR interval Probable left atrial enlargement Incomplete RBBB and LAFB Probable anteroseptal infarct, old no stemi Otherwise no significant change Confirmed by Drema Pry [phone]) on 04/26/2017 6:01:23 PM       Radiology No results found.  Procedures Procedures (including critical care time)  Medications Ordered in ED Medications  acetaminophen (TYLENOL) tablet 1,000 mg (1,000 mg Oral Given 04/26/17 1751)     Initial Impression / Assessment and Plan / ED Course  I have reviewed the triage vital signs and the nursing notes.  Pertinent labs & imaging results that  were available during my care of the patient were reviewed by me and considered in my medical decision making (see chart for details).     Hypertension without chest pain, shortness of breath. Labs without evidence of end organ damage.  Patient's blood pressure trended down without intervention.  Vitals:   04/26/17 1742 04/26/17 1828 04/26/17 1936  BP: (!) 173/99 (!) 173/54 (!) 152/84  Pulse:  75 68  Resp: (!) Temp:     TempSrc:     SpO2:  94% 93%     Recommended close follow-up with primary care provider to really evaluate her blood pressure medication  Final Clinical Impressions(s) / ED Diagnoses   Final diagnoses:  Other secondary hypertension   Disposition: Discharge  Condition: Good  I have discussed the results, Dx and Tx plan with the patient who expressed understanding and agree(s) with the plan. Discharge instructions discussed at great length. The patient was given strict return precautions who verbalized understanding of the instructions. No further questions at time of discharge.    Discharge Medication List as of 04/26/2017  7:30 PM       Follow Up: [name], MD [address] [place] [address] [place] [address] [phone]   in 1-2 days for close follow up regarding your elevated blood pressure.      Nira Conn, MD 04/26/17 2045

## 2017-04-26 NOTE — ED Notes (Signed)
ED Provider at bedside. 

## 2017-04-27 ENCOUNTER — Emergency Department (HOSPITAL_COMMUNITY)
Admission: EM | Admit: 2017-04-27 | Discharge: 2017-04-27 | Disposition: A | Discharge status: Home / Self Care | Payer: Medicare Other | Attending: Emergency Medicine | Admitting: Emergency Medicine

## 2017-04-27 ENCOUNTER — Encounter (HOSPITAL_COMMUNITY): Payer: Self-pay

## 2017-04-27 DIAGNOSIS — Z7982 Long term (current) use of aspirin: Secondary | ICD-10-CM | POA: Insufficient documentation

## 2017-04-27 DIAGNOSIS — Z79899 Other long term (current) drug therapy: Secondary | ICD-10-CM | POA: Diagnosis not present

## 2017-04-27 DIAGNOSIS — I2510 Atherosclerotic heart disease of native coronary artery without angina pectoris: Secondary | ICD-10-CM | POA: Diagnosis not present

## 2017-04-27 DIAGNOSIS — I10 Essential (primary) hypertension: Secondary | ICD-10-CM | POA: Insufficient documentation

## 2017-04-27 DIAGNOSIS — R030 Elevated blood-pressure reading, without diagnosis of hypertension: Secondary | ICD-10-CM | POA: Diagnosis not present

## 2017-04-27 DIAGNOSIS — R9431 Abnormal electrocardiogram [ECG] [EKG]: Secondary | ICD-10-CM | POA: Diagnosis not present

## 2017-04-27 LAB — URINALYSIS, ROUTINE W REFLEX MICROSCOPIC
Bilirubin Urine: NEGATIVE
Glucose, UA: NEGATIVE mg/dL
Ketones, ur: NEGATIVE mg/dL
Nitrite: NEGATIVE
Protein, ur: 100 mg/dL — AB
Specific Gravity, Urine: 1.01 (ref 1.005–1.030)
pH: 6 (ref 5.0–8.0)

## 2017-04-27 LAB — COMPREHENSIVE METABOLIC PANEL
ALT: 15 U/L (ref 14–54)
AST: 18 U/L (ref 15–41)
Albumin: 4 g/dL (ref 3.5–5.0)
Alkaline Phosphatase: 69 U/L (ref 38–126)
Anion gap: 12 (ref 5–15)
BUN: 20 mg/dL (ref 6–20)
CO2: 25 mmol/L (ref 22–32)
Calcium: 9.7 mg/dL (ref 8.9–10.3)
Chloride: 100 mmol/L — ABNORMAL LOW (ref 101–111)
Creatinine, Ser: 1.07 mg/dL — ABNORMAL HIGH (ref 0.44–1.00)
GFR calc Af Amer: 52 mL/min — ABNORMAL LOW (ref >60)
GFR calc non Af Amer: 45 mL/min — ABNORMAL LOW (ref >60)
Glucose, Bld: 95 mg/dL (ref 65–99)
Potassium: 4.2 mmol/L (ref 3.5–5.1)
Sodium: 137 mmol/L (ref 135–145)
Total Bilirubin: 0.8 mg/dL (ref 0.3–1.2)
Total Protein: 6.5 g/dL (ref 6.5–8.1)

## 2017-04-27 LAB — CBC WITH DIFFERENTIAL/PLATELET
Basophils Absolute: 0 10*3/uL (ref 0.0–0.1)
Basophils Relative: 0 %
Eosinophils Absolute: 0.2 10*3/uL (ref 0.0–0.7)
Eosinophils Relative: 3 %
HCT: 44.8 % (ref 36.0–46.0)
Hemoglobin: 14.7 g/dL (ref 12.0–15.0)
Lymphocytes Relative: 19 %
Lymphs Abs: 1.6 10*3/uL (ref 0.7–4.0)
MCH: 30.2 pg (ref 26.0–34.0)
MCHC: 32.8 g/dL (ref 30.0–36.0)
MCV: 92 fL (ref 78.0–100.0)
Monocytes Absolute: 0.6 10*3/uL (ref 0.1–1.0)
Monocytes Relative: 7 %
Neutro Abs: 6.3 10*3/uL (ref 1.7–7.7)
Neutrophils Relative %: 71 %
Platelets: 159 10*3/uL (ref 150–400)
RBC: 4.87 MIL/uL (ref 3.87–5.11)
RDW: 14.9 % (ref 11.5–15.5)
WBC: 8.7 10*3/uL (ref 4.0–10.5)

## 2017-04-27 LAB — TROPONIN I: Troponin I: <0.03 ng/mL (ref <0.03)

## 2017-04-27 LAB — BRAIN NATRIURETIC PEPTIDE: B Natriuretic Peptide: 123 pg/mL — ABNORMAL HIGH (ref 0.0–100.0)

## 2017-04-27 MED ORDER — CEPHALEXIN 250 MG PO CAPS
500.0000 mg | ORAL_CAPSULE | Freq: Once | ORAL | Status: DC
  Administered 2017-04-27: 500 mg via ORAL
  Filled 2017-04-27: qty 2

## 2017-04-27 MED ORDER — ISOSORBIDE MONONITRATE ER 30 MG PO TB24
60.0000 mg | ORAL_TABLET | Freq: Every day | ORAL | Status: DC
  Administered 2017-04-27: 60 mg via ORAL
  Filled 2017-04-27: qty 2

## 2017-04-27 MED ORDER — METOPROLOL TARTRATE 25 MG PO TABS
100.0000 mg | ORAL_TABLET | Freq: Two times a day (BID) | ORAL | Status: DC
  Administered 2017-04-27 (×2): 100 mg via ORAL
  Filled 2017-04-27 (×2): qty 4

## 2017-04-27 NOTE — ED Provider Notes (Signed)
Emergency Department Provider Note   I have reviewed the triage vital signs and the nursing notes.   HISTORY  Chief Complaint No chief complaint on file.   HPI [name] is a 83 y.o. female who is here from her primary doctor's office secondary to EKG changes. She was seen here yesterday secondary to hypertension was asked to follow-up with her primary doctor today which she did. There they said that she had ST elevation in V2 and V3 and come the emergency department EMS was called and she is brought back immediately. She has no chest pain, short of breath, nausea, vomiting, lightheadedness or other symptoms. States that she has been urinating normally. No vision changes.   Past Medical History:  Diagnosis Date  . Anemia   . Carotid arterial disease (HCC)    asymptomatic   . Chest tightness   . Coronary artery disease   . High blood pressure   . Leg pain    with walking  . Palpitations     Patient Active Problem List   Diagnosis Date Noted  . Renovascular hypertension 04/17/2016  . Essential hypertension 12/26/2014  . Postinflammatory pulmonary fibrosis (HCC) 12/25/2014  . Tachycardia 12/13/2014  . Aftercare following surgery of the circulatory system, NEC 11/28/2013  . CAD (coronary artery disease) 09/21/2013  . Right renal artery stenosis (HCC) 09/21/2013  . Hyperlipidemia 09/21/2013  . First degree AV block 09/21/2013  . Carotid stenosis 11/24/2011    Past Surgical History:  Procedure Laterality Date  . CARDIAC CATHETERIZATION  07/15/04   noncritical CAD,80% PLA treated medically. EF% 60. no RAS.  [name] CAROTID ENDARTERECTOMY     RIGHT=05/11/11, LEFT=03/30/11  . EYE SURGERY  2011   Cataract surgery Bilateral  . EYE SURGERY  2011   Bilateral eyelid surgery for ptosis  . renal artery duplex  02/23/13   ABDOMINAL AORTA:<50% DIAMETER REDUCTION. RIGHT RENAL ARTERY: 60-99% DIAMETER REDUCTION.[name] LEFT RENAL ARTERY:1-59% DIAMETER REDUCTION.    Current  Outpatient Rx  . Order #: [id number] Class: OTC  . Order #: [id number] Class: Historical Med  . Order #: [id number] Class: Historical Med  . Order #: [id number] Class: Normal  . Order #: [id number] Class: Historical Med  . Order #: [id number] Class: Historical Med  . Order #: [id number] Class: Historical Med  . Order #: [id number] Class: Normal  . Order #: [id number] Class: Historical Med  . Order #: [id number] Class: Historical Med  . Order #: [id number] Class: Historical Med    Allergies Cardura [doxazosin mesylate]; Lotensin [benazepril hcl]; and Tramadol  Family History  Problem Relation Age of Onset  . Lung disease Mother   . Heart disease Father   . Cancer Sister        liver cancer  . Heart disease Brother     Social History Social History  Substance Use Topics  . Smoking status: Never Smoker  . Smokeless tobacco: Never Used  . Alcohol use No    Review of Systems  All other systems negative except as documented in the HPI. All pertinent positives and negatives as reviewed in the HPI. ____________________________________________   PHYSICAL EXAM:  VITAL SIGNS: ED Triage Vitals  Enc Vitals Group     BP 04/27/17 1700 (!) 196/81     Pulse Rate 04/27/17 1700 66     Resp 04/27/17 1700 (!) 24     Temp --      Temp src --      SpO2 04/27/17 1644 98 %     Weight 04/27/17 1652 139 lb (63 kg)  Height --      Head Circumference --      Peak Flow --      Pain Score --      Pain Loc --      Pain Edu? --      Excl. in GC? --     Constitutional: Alert and oriented. Well appearing and in no acute distress. Eyes: Conjunctivae are normal. PERRL. EOMI. Head: Atraumatic. Nose: No congestion/rhinnorhea. Mouth/Throat: Mucous membranes are moist.  Oropharynx non-erythematous. Neck: No stridor.  No meningeal signs.   Cardiovascular: Normal rate, regular rhythm. Good peripheral circulation. Grossly normal heart sounds.   Respiratory: Normal respiratory effort.  No retractions. Lungs  CTAB. Gastrointestinal: Soft and nontender. No distention.  Musculoskeletal: No lower extremity tenderness nor edema. No gross deformities of extremities. Neurologic:  Normal speech and language. No gross focal neurologic deficits are appreciated.  Skin:  Skin is warm, dry and intact. No rash noted.   ____________________________________________   LABS (all labs ordered are listed, but only abnormal results are displayed)  Labs Reviewed  COMPREHENSIVE METABOLIC PANEL - Abnormal; Notable for the following:       Result Value   Chloride 100 (*)    Creatinine, Ser 1.07 (*)    GFR calc non Af Amer 45 (*)    GFR calc Af Amer 52 (*)    All other components within normal limits  URINALYSIS, ROUTINE W REFLEX MICROSCOPIC - Abnormal; Notable for the following:    APPearance CLOUDY (*)    Hgb urine dipstick SMALL (*)    Protein, ur 100 (*)    Leukocytes, UA LARGE (*)    Bacteria, UA MANY (*)    Squamous Epithelial / LPF TOO NUMEROUS TO COUNT (*)    All other components within normal limits  BRAIN NATRIURETIC PEPTIDE - Abnormal; Notable for the following:    B Natriuretic Peptide 123.0 (*)    All other components within normal limits  URINE CULTURE  CBC WITH DIFFERENTIAL/PLATELET  TROPONIN I   ____________________________________________  EKG   EKG Interpretation  Date/Time:  Tuesday April 27 2017 16:55:53 EDT Ventricular Rate:  64 PR Interval:    QRS Duration: 104 QT Interval:  432 QTC Calculation: 446 R Axis:   -37 Text Interpretation:  Sinus rhythm Prolonged PR interval Incomplete RBBB and LAFB Probable left ventricular hypertrophy Anterior Q waves, possibly due to LVH TWI in III new aVF flatter than yesterday Confirmed by Marily Memos [phone]) on 04/27/2017 5:31:12 PM       ____________________________________________  RADIOLOGY  No results found.  ____________________________________________   PROCEDURES  Procedure(s) performed:    Procedures   ____________________________________________   INITIAL IMPRESSION / ASSESSMENT AND PLAN / ED COURSE  Pertinent labs & imaging results that were available during my care of the patient were reviewed by me and considered in my medical decision making (see chart for details).  Patient's EKG with some mild T-wave inversions but negative troponin and asymptomatic. I discussed case with Dr. Shirlee Latch with cardiology who reviewed ECG's, labs and story and did not think she needed further workup in the hospital. Patient will continue working with her primary doctor for further blood pressure control. Otherwise will follow with her cardiologist.  ____________________________________________  FINAL CLINICAL IMPRESSION(S) / ED DIAGNOSES  Final diagnoses:  Hypertension, unspecified type     MEDICATIONS GIVEN DURING THIS VISIT:  Medications  metoprolol tartrate (LOPRESSOR) tablet 100 mg (100 mg Oral Given 04/27/17 2207)  isosorbide mononitrate (IMDUR) 24 hr tablet  60 mg (60 mg Oral Given 04/27/17 1832)     NEW OUTPATIENT MEDICATIONS STARTED DURING THIS VISIT:  Discharge Medication List as of 04/27/2017 10:12 PM      Note:  This document was prepared using Dragon voice recognition software and may include unintentional dictation errors.   Cordel Drewes, Barbara Cower, MD 04/28/17 [phone]

## 2017-04-27 NOTE — ED Triage Notes (Signed)
Pt arrives ems from [facility] with reports of ekg changes. Pt was seen here yesterday for htn. Today pt followed up at pcp who reports elevation changes in v2 and v3. Pt has no complaints at this time. Denies chest pain. PT states she did not take bp medication today.

## 2017-04-28 DIAGNOSIS — Z23 Encounter for immunization: Secondary | ICD-10-CM | POA: Diagnosis not present

## 2017-04-30 ENCOUNTER — Telehealth: Payer: Self-pay

## 2017-04-30 DIAGNOSIS — I150 Renovascular hypertension: Secondary | ICD-10-CM

## 2017-04-30 DIAGNOSIS — I701 Atherosclerosis of renal artery: Secondary | ICD-10-CM

## 2017-04-30 NOTE — Telephone Encounter (Signed)
Renal doppler scheduled for 05/04/17.

## 2017-04-30 NOTE — Telephone Encounter (Signed)
From: Thurmon Fair, MD  Sent: 04/28/2017  3:36 PM  To: Marzella Schlein Truitt, CMA   She is due a repeat renal Duplex US. Can we please try to get that before her 10/29 appointment?  [facility]

## 2017-05-03 DIAGNOSIS — G2581 Restless legs syndrome: Secondary | ICD-10-CM | POA: Diagnosis not present

## 2017-05-03 DIAGNOSIS — I10 Essential (primary) hypertension: Secondary | ICD-10-CM | POA: Diagnosis not present

## 2017-05-04 ENCOUNTER — Ambulatory Visit (HOSPITAL_COMMUNITY)
Admission: RE | Admit: 2017-05-04 | Discharge: 2017-05-04 | Disposition: A | Discharge status: Home / Self Care | Payer: Medicare Other | Source: Ambulatory Visit | Attending: Cardiology | Admitting: Cardiology

## 2017-05-04 DIAGNOSIS — I700 Atherosclerosis of aorta: Secondary | ICD-10-CM | POA: Insufficient documentation

## 2017-05-04 DIAGNOSIS — I774 Celiac artery compression syndrome: Secondary | ICD-10-CM | POA: Insufficient documentation

## 2017-05-04 DIAGNOSIS — I701 Atherosclerosis of renal artery: Secondary | ICD-10-CM | POA: Diagnosis not present

## 2017-05-04 DIAGNOSIS — I2510 Atherosclerotic heart disease of native coronary artery without angina pectoris: Secondary | ICD-10-CM | POA: Insufficient documentation

## 2017-05-04 DIAGNOSIS — I150 Renovascular hypertension: Secondary | ICD-10-CM | POA: Insufficient documentation

## 2017-05-04 DIAGNOSIS — E785 Hyperlipidemia, unspecified: Secondary | ICD-10-CM | POA: Insufficient documentation

## 2017-05-07 ENCOUNTER — Telehealth: Payer: Self-pay | Admitting: *Deleted

## 2017-05-07 NOTE — Telephone Encounter (Addendum)
-----   Message from Thurmon FairMihai Croitoru, MD sent at 05/05/2017 11:19 PM EDT ----- It's time to discuss stenting the right renal artery. Please refer to Dr. Allyson SabalBerry or Dr. Kirke CorinArida.   Spoke with pt, aware of results. Follow up scheduled with dr berry

## 2017-05-17 ENCOUNTER — Ambulatory Visit (INDEPENDENT_AMBULATORY_CARE_PROVIDER_SITE_OTHER): Payer: Medicare Other | Admitting: Cardiovascular Disease

## 2017-05-17 ENCOUNTER — Telehealth: Payer: Self-pay | Admitting: Cardiovascular Disease

## 2017-05-17 ENCOUNTER — Encounter: Payer: Self-pay | Admitting: Cardiovascular Disease

## 2017-05-17 VITALS — BP 162/64 | HR 62 | Ht 62.0 in | Wt 129.0 lb

## 2017-05-17 DIAGNOSIS — I2510 Atherosclerotic heart disease of native coronary artery without angina pectoris: Secondary | ICD-10-CM | POA: Diagnosis not present

## 2017-05-17 DIAGNOSIS — I6523 Occlusion and stenosis of bilateral carotid arteries: Secondary | ICD-10-CM | POA: Diagnosis not present

## 2017-05-17 DIAGNOSIS — Z79899 Other long term (current) drug therapy: Secondary | ICD-10-CM | POA: Diagnosis not present

## 2017-05-17 DIAGNOSIS — E785 Hyperlipidemia, unspecified: Secondary | ICD-10-CM

## 2017-05-17 DIAGNOSIS — I701 Atherosclerosis of renal artery: Secondary | ICD-10-CM

## 2017-05-17 DIAGNOSIS — I150 Renovascular hypertension: Secondary | ICD-10-CM | POA: Diagnosis not present

## 2017-05-17 MED ORDER — LOSARTAN POTASSIUM 25 MG PO TABS: 25.0000 mg | ORAL_TABLET | Freq: Every day | ORAL | 3 refills | Status: DC

## 2017-05-17 NOTE — Telephone Encounter (Signed)
F/u message ° °Pt returning RN call .please call back to discuss  °

## 2017-05-17 NOTE — Telephone Encounter (Signed)
lmtcb

## 2017-05-17 NOTE — Telephone Encounter (Signed)
New message    Pt is calling asking for a call back from [place].

## 2017-05-17 NOTE — Progress Notes (Signed)
Cardiology Office Note    Date:  05/17/2017   ID:  Lindsay Sicklernestine C Diloreto, DOB 01/03/1927, MRN [id number]  PCP:  Merri BrunettePharr, Walter, MD  Cardiologist:   Thurmon FairMihai Shadawn Hanaway, MD   Chief Complaint  Patient presents with  . Follow-up    no chest pain, no other concerns    History of Present Illness:  [name] is a 83 y.o. female with long-standing history of peripheral arterial disease, status post bilateral carotid endarterectomy in 2012 and with high-grade right renal artery stenosis, managed medically. She also has documented asymptomatic coronary artery disease in secondary branches (80% posterolateral ventricular artery stenosis by catheterization 2005) also treated medically, hypertension and hyperlipidemia.   Feels well, but has recently had problems with spikes in her blood pressure.  She was seen in the emergency room on October 8 this is BP 214/108) and October 9 (196/81).  She was complaining of headaches, did not have rest pain or dyspnea.  3 weeks earlier her primary care physician had discontinued amlodipine and losartan-HCTZ.  Her creatinine was 1.03, BUN 15, potassium 4.2 react biomarkers were normal.  ECG both days showed sinus rhythm with prolonged PR interval incomplete right bundle branch block left anterior fascicular block.  Her blood pressure remains erratic.  She denies edema, palpitations, syncope, focal neurological events or other cardiovascular complaints.  Had labs in the emergency room earlier this month as well as with Dr. Renne CriglerPharr in August, but a lipid profile was not checked.  She is on rosuvastatin.  We repeated her renal duplex ultrasound a few days ago and it shows worsening of right renal artery stenosis with peak velocity now in excess of 600 cm/s.  He has an appointment to see Dr. Allyson SabalBerry.  She has normal left ventricular systolic function by echo performed in 2014. Carotid ultrasonography was last performed in October 2016 (bilateral carotid endarterectomy  sites patent, stenosis).  Past Medical History:  Diagnosis Date  . Anemia   . Carotid arterial disease (HCC)    asymptomatic   . Chest tightness   . Coronary artery disease   . High blood pressure   . Leg pain    with walking  . Palpitations     Past Surgical History:  Procedure Laterality Date  . CARDIAC CATHETERIZATION  07/15/04   noncritical CAD,80% PLA treated medically. EF% 60. no RAS.  [name]. CAROTID ENDARTERECTOMY     RIGHT=05/11/11, LEFT=03/30/11  . EYE SURGERY  2011   Cataract surgery Bilateral  . EYE SURGERY  2011   Bilateral eyelid surgery for ptosis  . renal artery duplex  02/23/13   ABDOMINAL AORTA:<50% DIAMETER REDUCTION. RIGHT RENAL ARTERY: 60-99% DIAMETER REDUCTION.[name]. LEFT RENAL ARTERY:1-59% DIAMETER REDUCTION.    Current Medications: Outpatient Medications Prior to Visit  Medication Sig Dispense Refill  . aspirin EC 81 MG tablet Take 1 tablet (81 mg total) by mouth daily. 30 tablet 11  . calcium carbonate (CALCIUM 600) 600 MG TABS tablet Take 1,200-1,500 mg by mouth daily.    . Cholecalciferol (VITAMIN D-3) 1000 units CAPS Take 1,000 Units by mouth daily.    . isosorbide mononitrate (IMDUR) 60 MG 24 hr tablet Take 60 mg by mouth daily.     . Magnesium 500 MG TABS Take 500 mg by mouth daily.    . metoprolol (LOPRESSOR) 100 MG tablet Take 100 mg by mouth 2 (two) times daily.     [name]. rOPINIRole (REQUIP) 1 MG tablet Take 1 mg by mouth daily with lunch.     .[name]  rOPINIRole (REQUIP) 2 MG tablet Take 2 mg by mouth at bedtime.    . rosuvastatin (CRESTOR) 10 MG tablet Take 10 mg by mouth at bedtime.     . famotidine (PEPCID) 20 MG tablet Take 1 tablet (20 mg total) by mouth at bedtime. (Patient not taking: Reported on 05/17/2017) 90 tablet 1  . pantoprazole (PROTONIX) 40 MG tablet TAKE 1 TABLET (40 MG TOTAL) BY MOUTH DAILY. TAKE 30-60 MIN BEFORE FIRST MEAL OF THE DAY (Patient not taking: Reported on 05/17/2017) 90 tablet 0   No facility-administered medications prior to visit.        Allergies:   Cardura [doxazosin mesylate]; Lotensin [benazepril hcl]; and Tramadol   Social History   Social History  . Marital status: Widowed    Spouse name: N/A  . Number of children: N/A  . Years of education: N/A   Social History Main Topics  . Smoking status: Never Smoker  . Smokeless tobacco: Never Used  . Alcohol use No  . Drug use: No  . Sexual activity: Not Asked   Other Topics Concern  . None   Social History Narrative  . None     Family History:  The patient's family history includes Cancer in her sister; Heart disease in her brother and father; Lung disease in her mother.   ROS:   Please see the history of present illness.    ROS All other systems reviewed and are negative.   PHYSICAL EXAM:   VS:  BP (!) 162/64   Pulse 62   Ht 5\' 2"  (1.575 m)   Wt 129 lb (58.5 kg)   BMI 23.59 kg/m     General: Alert, oriented x3, no distress, lean Head: no evidence of trauma, PERRL, EOMI, no exophtalmos or lid lag, no myxedema, no xanthelasma; normal ears, nose and oropharynx Neck: normal jugular venous pulsations and no hepatojugular reflux; brisk carotid pulses without delay and HEENT bilateral carotid bruits.  Hours of endarterectomy Chest: clear to auscultation, no signs of consolidation by percussion or palpation, normal fremitus, symmetrical and full respiratory excursions Cardiovascular: normal position and quality of the apical impulse, regular rhythm, normal first and second heart sounds, no murmurs, rubs or gallops Abdomen: no tenderness or distention, no masses by palpation, no abnormal pulsatility or arterial bruits, normal bowel sounds, no hepatosplenomegaly Extremities: no clubbing, cyanosis or edema; 2+ radial, ulnar and brachial pulses bilaterally; 2+ right femoral, posterior tibial and dorsalis pedis pulses; 2+ left femoral, posterior tibial and dorsalis pedis pulses; no subclavian or femoral bruits Neurological: grossly nonfocal Psych: Normal mood  and affect   Wt Readings from Last 3 Encounters:  05/17/17 129 lb (58.5 kg)  04/27/17 139 lb (63 kg)  11/16/16 139 lb (63 kg)      Studies/Labs Reviewed:   EKG:  EKG is not ordered today.  In October 9 showed sinus rhythm with prolonged PR interval, incomplete right bundle branch block, left axis deviation not quite meeting criteria for left anterior fascicular block, QS pattern in leads V1-V2, nonspecific T wave changes Recent Labs: 04/27/2017: ALT 15; B Natriuretic Peptide 123.0; BUN 20; Creatinine, Ser 1.07; Hemoglobin 14.7; Platelets 159; Potassium 4.2; Sodium 137    ASSESSMENT:    1. Right renal artery stenosis (HCC)   2. Bilateral carotid artery stenosis   3. Renovascular hypertension   4. Dyslipidemia   5. Coronary artery disease involving native coronary artery of native heart without angina pectoris   6. Medication management  PLAN:  In order of problems listed above:  1. R Renal artery stenosis: She has severe right renal artery stenosis which is worsened compared to 2016.  To see Dr. Allyson Sabal tomorrow. 2. S/P bilateral CEA: Has not had scans since 2016, time to recheck 3. HTN: Well controlled on medication in the past, but amlodipine, losartan and hydrochlorothiazide have all been discontinued within the last month or so.  I am not sure why the decision was made, she may have had hypotension.  Will reintroduce a lower dose of losartan. 4. HLP: Get updated labs.  Continue rosuvastatin. 5. CAD: Asymptomatic, no active ischemic ECG changes    Medication Adjustments/Labs and Tests Ordered: Current medicines are reviewed at length with the patient today.  Concerns regarding medicines are outlined above.  Medication changes, Labs and Tests ordered today are listed in the Patient Instructions below. Patient Instructions  Medication Instructions: Dr Royann Shivers has recommended making the following medication changes: 1. START Losartan 25 mg - take 1 tablet by mouth  daily  Labwork: Your physician recommends that you return for lab work at your earliest convenience - FASTING.  Testing/Procedures: 1. Carotid Duplex - Your physician has requested that you have a carotid duplex. This test is an ultrasound of the carotid arteries in your neck. It looks at blood flow through these arteries that supply the brain with blood. Allow one hour for this exam. There are no restrictions or special instructions.  Follow-up: Dr Royann Shivers recommends that you schedule a follow-up appointment in 6 months. You will receive a reminder letter in the mail two months in advance. If you don't receive a letter, please call our office to schedule the follow-up appointment.  If you need a refill on your cardiac medications before your next appointment, please call your pharmacy.    Signed, Thurmon Fair, MD  05/17/2017 8:50 AM    [facility] [address] [place], [place], [place]  [address] Phone: [phone]; Fax: [phone]

## 2017-05-17 NOTE — Telephone Encounter (Signed)
Forward to [place] 

## 2017-05-17 NOTE — Telephone Encounter (Signed)
Returned call to patient. Reports that she has Losartan-Hydrochlorothiazide. Advised patient to discontinue using this medication and start Losartan (instructions listed below) when she receives it from her mail order company.   Dr Royann Shiversroitoru has recommended making the following medication changes: 1. START Losartan 25 mg - take 1 tablet by mouth daily  Patient verbalized understanding and agreed with plan.

## 2017-05-17 NOTE — Patient Instructions (Signed)
Medication Instructions: Dr Royann Shiversroitoru has recommended making the following medication changes: 1. START Losartan 25 mg - take 1 tablet by mouth daily  Labwork: Your physician recommends that you return for lab work at your earliest convenience - FASTING.  Testing/Procedures: 1. Carotid Duplex - Your physician has requested that you have a carotid duplex. This test is an ultrasound of the carotid arteries in your neck. It looks at blood flow through these arteries that supply the brain with blood. Allow one hour for this exam. There are no restrictions or special instructions.  Follow-up: Dr Royann Shiversroitoru recommends that you schedule a follow-up appointment in 6 months. You will receive a reminder letter in the mail two months in advance. If you don't receive a letter, please call our office to schedule the follow-up appointment.  If you need a refill on your cardiac medications before your next appointment, please call your pharmacy.

## 2017-05-18 ENCOUNTER — Ambulatory Visit (INDEPENDENT_AMBULATORY_CARE_PROVIDER_SITE_OTHER): Payer: Medicare Other | Admitting: Cardiovascular Disease

## 2017-05-18 ENCOUNTER — Encounter: Payer: Self-pay | Admitting: Cardiovascular Disease

## 2017-05-18 DIAGNOSIS — I150 Renovascular hypertension: Secondary | ICD-10-CM | POA: Diagnosis not present

## 2017-05-18 NOTE — Assessment & Plan Note (Signed)
[name] was referred to me by Dr. Royann Shiversroitoru for evaluation of treatment of renal vascular hypertension. She has a history of significant right renal artery stenosis demonstrated by the time of cath probably in 2005. She has a history of hypertension on multiple medications in the past which for unknown reasons were discontinued by her PCP recently. Renal Doppler studies performed in our office 05/05/17 revealed progression of her right renal artery stenosis with a peak velocity of 626 cm/s and a renal aortic ratio of 5. Her right renal dimension has remained fairly stable. Her renal function is normal as well. Her blood pressure today is 160/64. She does check her blood pressure at home. At this point, I suggested she go back on her medications and check her blood pressure at home on a daily basis. She will keep a blood pressure diary and will see [name] back in the office in one month for review and titration. She certainly could go up on her ARB and/or amlodipine. We talked about renal artery stenting as an option in the setting of assistant hypertension on multiple antihypertensive medications.

## 2017-05-18 NOTE — Patient Instructions (Signed)
Medication Instructions: Your physician recommends that you continue on your current medications as directed. Please refer to the Current Medication list given to you today.   Follow-Up: We request that you follow-up in: 1 month with [facility] and in 3 months with Dr Allyson SabalBerry Your physician has requested that you regularly monitor and record your blood pressure readings at home. Please use the same machine at the same time of day to check your readings and record them to bring to your follow-up visit.  If you need a refill on your cardiac medications before your next appointment, please call your pharmacy.

## 2017-05-18 NOTE — Progress Notes (Signed)
05/18/2017 [name]   08/05/1926  [id number]  Primary Physician Merri BrunettePharr, Walter, MD Primary Cardiologist: Runell GessJonathan J Duke Weisensel MD Nicholes CalamityFACP, FACC, FAHA, [place]  HPI:  [name] is a 83 y.o. female widowed Caucasian female mother of 2 with no grandchildren's second husband passed away 15 years ago. [place] [facility]  [place] She was referred to me by Dr. Royann Shiversroitoru for evaluation of treatment of renal vascular hypertension. She has a history of noncritical CAD by cath in 2005 at which time he demonstrated a significant right renal artery stenosis which has been followed by duplex ultrasound since. Most recently 05/04/17 renal Doppler suggest progression of disease with a renal aortic ratio of 5. Her right renal dimension has remained stable. Her renal function is normal as well. She was on multiple antihypertensive medications which were discontinued by her PCP for unknown reasons. She was seen in the emergency room earlier this month with significant hypertension and a headache. She has had bilateral carotid endarterectomies remotely.   Current Meds  Medication Sig  . aspirin EC 81 MG tablet Take 1 tablet (81 mg total) by mouth daily.  . calcium carbonate (CALCIUM 600) 600 MG TABS tablet Take 1,200-1,500 mg by mouth daily.  . Cholecalciferol (VITAMIN D-3) 1000 units CAPS Take 1,000 Units by mouth daily.  . isosorbide mononitrate (IMDUR) 60 MG 24 hr tablet Take 60 mg by mouth daily.   [name]. losartan (COZAAR) 25 MG tablet Take 1 tablet (25 mg total) by mouth daily.  . Magnesium 500 MG TABS Take 500 mg by mouth daily.  . metoprolol (LOPRESSOR) 100 MG tablet Take 100 mg by mouth 2 (two) times daily.   [name]. rOPINIRole (REQUIP) 1 MG tablet Take 1 mg by mouth daily with lunch.   [name]. rOPINIRole (REQUIP) 2 MG tablet Take 2 mg by mouth at bedtime.  . rosuvastatin (CRESTOR) 10 MG tablet Take 10 mg by mouth at bedtime.      Allergies  Allergen Reactions  . Cardura [Doxazosin Mesylate]    Reaction not recalled by the patient  . Lotensin [Benazepril Hcl]     Reaction not recalled by the patient  . Tramadol Nausea And Vomiting    Social History   Social History  . Marital status: Widowed    Spouse name: N/A  . Number of children: N/A  . Years of education: N/A   Occupational History  . Not on file.   Social History Main Topics  . Smoking status: Never Smoker  . Smokeless tobacco: Never Used  . Alcohol use No  . Drug use: No  . Sexual activity: Not on file   Other Topics Concern  . Not on file   Social History Narrative  . No narrative on file     Review of Systems: General: negative for chills, fever, night sweats or weight changes.  Cardiovascular: negative for chest pain, dyspnea on exertion, edema, orthopnea, palpitations, paroxysmal nocturnal dyspnea or shortness of breath Dermatological: negative for rash Respiratory: negative for cough or wheezing Urologic: negative for hematuria Abdominal: negative for nausea, vomiting, diarrhea, bright red blood per rectum, melena, or hematemesis Neurologic: negative for visual changes, syncope, or dizziness All other systems reviewed and are otherwise negative except as noted above.    Blood pressure (!) 160/64, pulse 65, height 5\' 2"  (1.575 m), weight 128 lb 3.2 oz (58.2 kg).  General appearance: alert and no distress Neck: no adenopathy, no JVD, supple, symmetrical, trachea midline, thyroid not enlarged,  symmetric, no tenderness/mass/nodules and Soft bilateral carotid bruits Lungs: clear to auscultation bilaterally Heart: regular rate and rhythm, S1, S2 normal, no murmur, click, rub or gallop Extremities: extremities normal, atraumatic, no cyanosis or edema Pulses: 2+ and symmetric Skin: Skin color, texture, turgor normal. No rashes or lesions Neurologic: Alert and oriented X 3, normal strength and tone. Normal symmetric reflexes. Normal coordination and gait  EKG not performed today  ASSESSMENT AND  PLAN:   Renovascular hypertension [name] was referred to me by Dr. Royann Shivers for evaluation of treatment of renal vascular hypertension. She has a history of significant right renal artery stenosis demonstrated by the time of cath probably in 2005. She has a history of hypertension on multiple medications in the past which for unknown reasons were discontinued by her PCP recently. Renal Doppler studies performed in our office 05/05/17 revealed progression of her right renal artery stenosis with a peak velocity of 626 cm/s and a renal aortic ratio of 5. Her right renal dimension has remained fairly stable. Her renal function is normal as well. Her blood pressure today is 160/64. She does check her blood pressure at home. At this point, I suggested she go back on her medications and check her blood pressure at home on a daily basis. She will keep a blood pressure diary and will see Belenda Cruise back in the office in one month for review and titration. She certainly could go up on her ARB and/or amlodipine. We talked about renal artery stenting as an option in the setting of assistant hypertension on multiple antihypertensive medications.      Runell Gess MD FACP,FACC,FAHA, [facility] 05/18/2017 9:37 AM

## 2017-05-20 ENCOUNTER — Other Ambulatory Visit: Payer: Self-pay | Admitting: Internal Medicine

## 2017-05-20 DIAGNOSIS — J8410 Pulmonary fibrosis, unspecified: Secondary | ICD-10-CM

## 2017-06-07 ENCOUNTER — Ambulatory Visit (HOSPITAL_COMMUNITY)
Admission: RE | Admit: 2017-06-07 | Discharge: 2017-06-07 | Disposition: A | Discharge status: Home / Self Care | Payer: Medicare Other | Source: Ambulatory Visit | Attending: Internal Medicine | Admitting: Internal Medicine

## 2017-06-07 DIAGNOSIS — I6523 Occlusion and stenosis of bilateral carotid arteries: Secondary | ICD-10-CM | POA: Insufficient documentation

## 2017-06-18 ENCOUNTER — Ambulatory Visit: Payer: Medicare Other

## 2017-06-24 ENCOUNTER — Telehealth: Payer: Self-pay | Admitting: Cardiovascular Disease

## 2017-06-24 NOTE — Telephone Encounter (Signed)
°  New message ° °Pt verbalized that she is returning call for the rn  °

## 2017-06-24 NOTE — Telephone Encounter (Signed)
Returned the call to the patient for results:  Notes recorded by Thurmon Fairroitoru, Mihai, MD on 06/09/2017 at 12:24 PM EST Mild plaque in the carotids, no change. Narrowing in right subclavian artery. Please only check BP in the left arm. BP in the right arm will underestimate true BP. MCr  She verbalized her understanding.

## 2017-07-21 DIAGNOSIS — I2510 Atherosclerotic heart disease of native coronary artery without angina pectoris: Secondary | ICD-10-CM | POA: Insufficient documentation

## 2017-07-21 DIAGNOSIS — I779 Disorder of arteries and arterioles, unspecified: Secondary | ICD-10-CM | POA: Insufficient documentation

## 2017-07-21 DIAGNOSIS — R002 Palpitations: Secondary | ICD-10-CM | POA: Insufficient documentation

## 2017-07-21 DIAGNOSIS — I739 Peripheral vascular disease, unspecified: Secondary | ICD-10-CM

## 2017-07-21 DIAGNOSIS — R0789 Other chest pain: Secondary | ICD-10-CM | POA: Insufficient documentation

## 2017-08-05 DIAGNOSIS — H532 Diplopia: Secondary | ICD-10-CM | POA: Diagnosis not present

## 2017-08-18 ENCOUNTER — Ambulatory Visit (INDEPENDENT_AMBULATORY_CARE_PROVIDER_SITE_OTHER): Payer: Medicare Other | Admitting: Cardiovascular Disease

## 2017-08-18 ENCOUNTER — Other Ambulatory Visit: Payer: Self-pay

## 2017-08-18 ENCOUNTER — Encounter: Payer: Self-pay | Admitting: Cardiovascular Disease

## 2017-08-18 VITALS — BP 126/62 | HR 56 | Ht 63.0 in | Wt 129.8 lb

## 2017-08-18 DIAGNOSIS — I701 Atherosclerosis of renal artery: Secondary | ICD-10-CM

## 2017-08-18 DIAGNOSIS — I150 Renovascular hypertension: Secondary | ICD-10-CM | POA: Diagnosis not present

## 2017-08-18 DIAGNOSIS — I6523 Occlusion and stenosis of bilateral carotid arteries: Secondary | ICD-10-CM

## 2017-08-18 NOTE — Assessment & Plan Note (Signed)
History of renal vascular hypertension with renal Dopplers revealed revealing a high-grade ostial right renal artery stenosis of the renal aortic ratio of 5. Her blood pressures he is 126/62 on 2 antihypertensive medications. This point, there is no indication for renal artery intervention. We will continue to follow her conservatively.

## 2017-08-18 NOTE — Progress Notes (Signed)
Thanks, KeySpanJonathan MCr

## 2017-08-18 NOTE — Patient Instructions (Signed)
Medication Instructions: Your physician recommends that you continue on your current medications as directed. Please refer to the Current Medication list given to you today.   Testing/Procedures: Your physician has requested that you have a carotid duplex in 1 year. This test is an ultrasound of the carotid arteries in your neck. It looks at blood flow through these arteries that supply the brain with blood. Allow one hour for this exam. There are no restrictions or special instructions.  Your physician has requested that you have a renal artery duplex in 6 months. During this test, an ultrasound is used to evaluate blood flow to the kidneys. Allow one hour for this exam. Do not eat after midnight the day before and avoid carbonated beverages. Take your medications as you usually do.  Follow-Up: Your physician wants you to follow-up in: 1 year with Dr. Allyson SabalBerry. You will receive a reminder letter in the mail two months in advance. If you don't receive a letter, please call our office to schedule the follow-up appointment.  If you need a refill on your cardiac medications before your next appointment, please call your pharmacy.

## 2017-08-18 NOTE — Progress Notes (Signed)
08/18/2017 [name]   01/02/1927  [id number]  Primary Physician [name], MD Primary Cardiologist: Runell Gess MD Nicholes Calamity, [place]  HPI:  [name] is a 84 y.o.   widowed Caucasian female mother of 2 with no grandchildren's second husband passed away 15 years ago.I last saw her in the office 05/18/17 She currently lives in [facility]. She was referred to me by Dr. Royann Shivers for evaluation of treatment of renal vascular hypertension. She has a history of noncritical CAD by cath in 2005 at which time he demonstrated a significant right renal artery stenosis which has been followed by duplex ultrasound since. Most recently 05/04/17 renal Doppler suggest progression of disease with a renal aortic ratio of 5. Her right renal dimension has remained stable. Her renal function is normal as well. She was on multiple antihypertensive medications which were discontinued by her PCP for unknown reasons. She was seen in the emergency room earlier this month with significant hypertension and a headache. She has had bilateral carotid endarterectomies remotely. Since I saw her 3 months ago her blood pressure is been under better control on metoprolol and losartan. She is otherwise asymptomatic. She does have a high-grade right subclavian artery stenosis demonstrated on carotid Doppler study but is asymptomatic from this. Her blood pressure should only be taken in her left arm in the future.   Current Meds  Medication Sig  . aspirin EC 81 MG tablet Take 1 tablet (81 mg total) by mouth daily.  . calcium carbonate (CALCIUM 600) 600 MG TABS tablet Take 1,200-1,500 mg by mouth daily.  . Cholecalciferol (VITAMIN D-3) 1000 units CAPS Take 1,000 Units by mouth daily.  . isosorbide mononitrate (IMDUR) 60 MG 24 hr tablet Take 60 mg by mouth daily.   . Magnesium 500 MG TABS Take 500 mg by mouth daily.  . metoprolol (LOPRESSOR) 100 MG tablet Take 100 mg by mouth 2 (two) times daily.     . pantoprazole (PROTONIX) 40 MG tablet TAKE 1 TABLET (40 MG TOTAL) BY MOUTH DAILY. TAKE 30-60 MIN BEFORE FIRST MEAL OF THE DAY  . rOPINIRole (REQUIP) 1 MG tablet Take 1 mg by mouth daily with lunch.   [name] rOPINIRole (REQUIP) 2 MG tablet Take 2 mg by mouth at bedtime.  . rosuvastatin (CRESTOR) 10 MG tablet Take 10 mg by mouth at bedtime.      Allergies  Allergen Reactions  . Cardura [Doxazosin Mesylate]     Reaction not recalled by the patient  . Lotensin [Benazepril Hcl]     Reaction not recalled by the patient  . Tramadol Nausea And Vomiting    Social History   Socioeconomic History  . Marital status: Widowed    Spouse name: Not on file  . Number of children: Not on file  . Years of education: Not on file  . Highest education level: Not on file  Social Needs  . Financial resource strain: Not on file  . Food insecurity - worry: Not on file  . Food insecurity - inability: Not on file  . Transportation needs - medical: Not on file  . Transportation needs - non-medical: Not on file  Occupational History  . Not on file  Tobacco Use  . Smoking status: Never Smoker  . Smokeless tobacco: Never Used  Substance and Sexual Activity  . Alcohol use: No  . Drug use: No  . Sexual activity: Not on file  Other Topics Concern  . Not  on file  Social History Narrative  . Not on file     Review of Systems: General: negative for chills, fever, night sweats or weight changes.  Cardiovascular: negative for chest pain, dyspnea on exertion, edema, orthopnea, palpitations, paroxysmal nocturnal dyspnea or shortness of breath Dermatological: negative for rash Respiratory: negative for cough or wheezing Urologic: negative for hematuria Abdominal: negative for nausea, vomiting, diarrhea, bright red blood per rectum, melena, or hematemesis Neurologic: negative for visual changes, syncope, or dizziness All other systems reviewed and are otherwise negative except as noted above.    Blood  pressure 126/62, pulse (!) 56, height 5\' 3"  (1.6 m), weight 129 lb 12.8 oz (58.9 kg).  General appearance: alert and no distress Neck: no adenopathy, no JVD, supple, symmetrical, trachea midline, thyroid not enlarged, symmetric, no tenderness/mass/nodules and right carotid bruit Lungs: clear to auscultation bilaterally Heart: regular rate and rhythm, S1, S2 normal, no murmur, click, rub or gallop Extremities: extremities normal, atraumatic, no cyanosis or edema Pulses: 2+ and symmetric Skin: Skin color, texture, turgor normal. No rashes or lesions Neurologic: Alert and oriented X 3, normal strength and tone. Normal symmetric reflexes. Normal coordination and gait  EKG not performed today  ASSESSMENT AND PLAN:   Renovascular hypertension History of renal vascular hypertension with renal Dopplers revealed revealing a high-grade ostial right renal artery stenosis of the renal aortic ratio of 5. Her blood pressures he is 126/62 on 2 antihypertensive medications. This point, there is no indication for renal artery intervention. We will continue to follow her conservatively.      Runell GessJonathan J. Audi Conover MD FACP,FACC,FAHA, [facility] 08/18/2017 1:29 PM

## 2017-09-17 DIAGNOSIS — M8580 Other specified disorders of bone density and structure, unspecified site: Secondary | ICD-10-CM | POA: Diagnosis not present

## 2017-09-17 DIAGNOSIS — E7800 Pure hypercholesterolemia, unspecified: Secondary | ICD-10-CM | POA: Diagnosis not present

## 2017-09-17 DIAGNOSIS — N390 Urinary tract infection, site not specified: Secondary | ICD-10-CM | POA: Diagnosis not present

## 2017-09-17 DIAGNOSIS — I10 Essential (primary) hypertension: Secondary | ICD-10-CM | POA: Diagnosis not present

## 2017-09-21 DIAGNOSIS — Z0000 Encounter for general adult medical examination without abnormal findings: Secondary | ICD-10-CM | POA: Diagnosis not present

## 2017-09-21 DIAGNOSIS — J8410 Pulmonary fibrosis, unspecified: Secondary | ICD-10-CM | POA: Diagnosis not present

## 2017-09-21 DIAGNOSIS — I2510 Atherosclerotic heart disease of native coronary artery without angina pectoris: Secondary | ICD-10-CM | POA: Diagnosis not present

## 2017-09-21 DIAGNOSIS — K5900 Constipation, unspecified: Secondary | ICD-10-CM | POA: Diagnosis not present

## 2017-09-21 DIAGNOSIS — I7300 Raynaud's syndrome without gangrene: Secondary | ICD-10-CM | POA: Diagnosis not present

## 2017-09-21 DIAGNOSIS — I701 Atherosclerosis of renal artery: Secondary | ICD-10-CM | POA: Diagnosis not present

## 2017-09-21 DIAGNOSIS — I341 Nonrheumatic mitral (valve) prolapse: Secondary | ICD-10-CM | POA: Diagnosis not present

## 2017-09-21 DIAGNOSIS — L570 Actinic keratosis: Secondary | ICD-10-CM | POA: Diagnosis not present

## 2017-09-21 DIAGNOSIS — N183 Chronic kidney disease, stage 3 (moderate): Secondary | ICD-10-CM | POA: Diagnosis not present

## 2017-09-21 DIAGNOSIS — M720 Palmar fascial fibromatosis [Dupuytren]: Secondary | ICD-10-CM | POA: Diagnosis not present

## 2017-09-21 DIAGNOSIS — I129 Hypertensive chronic kidney disease with stage 1 through stage 4 chronic kidney disease, or unspecified chronic kidney disease: Secondary | ICD-10-CM | POA: Diagnosis not present

## 2017-09-21 DIAGNOSIS — E7800 Pure hypercholesterolemia, unspecified: Secondary | ICD-10-CM | POA: Diagnosis not present

## 2017-09-22 ENCOUNTER — Other Ambulatory Visit: Payer: Self-pay | Admitting: Internal Medicine

## 2017-09-22 DIAGNOSIS — R413 Other amnesia: Secondary | ICD-10-CM

## 2017-09-29 ENCOUNTER — Ambulatory Visit
Admission: RE | Admit: 2017-09-29 | Discharge: 2017-09-29 | Disposition: A | Discharge status: Home / Self Care | Payer: Medicare Other | Source: Ambulatory Visit | Attending: Internal Medicine | Admitting: Internal Medicine

## 2017-09-29 DIAGNOSIS — Z1212 Encounter for screening for malignant neoplasm of rectum: Secondary | ICD-10-CM | POA: Diagnosis not present

## 2017-09-29 DIAGNOSIS — R413 Other amnesia: Secondary | ICD-10-CM | POA: Diagnosis not present

## 2017-10-01 ENCOUNTER — Other Ambulatory Visit: Payer: Medicare Other

## 2017-10-20 DIAGNOSIS — R634 Abnormal weight loss: Secondary | ICD-10-CM | POA: Diagnosis not present

## 2017-10-20 DIAGNOSIS — M810 Age-related osteoporosis without current pathological fracture: Secondary | ICD-10-CM | POA: Diagnosis not present

## 2017-10-20 DIAGNOSIS — L509 Urticaria, unspecified: Secondary | ICD-10-CM | POA: Diagnosis not present

## 2017-10-20 DIAGNOSIS — K5900 Constipation, unspecified: Secondary | ICD-10-CM | POA: Diagnosis not present

## 2017-11-02 DIAGNOSIS — M810 Age-related osteoporosis without current pathological fracture: Secondary | ICD-10-CM | POA: Diagnosis not present

## 2017-11-09 ENCOUNTER — Other Ambulatory Visit: Payer: Self-pay | Admitting: Internal Medicine

## 2017-11-09 DIAGNOSIS — J8410 Pulmonary fibrosis, unspecified: Secondary | ICD-10-CM

## 2017-11-14 ENCOUNTER — Other Ambulatory Visit: Payer: Self-pay | Admitting: Internal Medicine

## 2017-11-16 ENCOUNTER — Encounter: Payer: Self-pay | Admitting: Internal Medicine

## 2017-11-16 ENCOUNTER — Ambulatory Visit (INDEPENDENT_AMBULATORY_CARE_PROVIDER_SITE_OTHER): Payer: Medicare Other | Admitting: Internal Medicine

## 2017-11-16 VITALS — BP 204/80 | HR 70 | Ht 62.0 in | Wt 126.0 lb

## 2017-11-16 DIAGNOSIS — I6523 Occlusion and stenosis of bilateral carotid arteries: Secondary | ICD-10-CM

## 2017-11-16 DIAGNOSIS — J8410 Pulmonary fibrosis, unspecified: Secondary | ICD-10-CM | POA: Diagnosis not present

## 2017-11-16 DIAGNOSIS — I10 Essential (primary) hypertension: Secondary | ICD-10-CM | POA: Diagnosis not present

## 2017-11-16 NOTE — Patient Instructions (Addendum)
Monitor your breathing and return if it gets worse  Or check your 02 saturations to see if they fall less then 92%    Pulmonary follow up is as needed

## 2017-11-16 NOTE — Assessment & Plan Note (Signed)
Did not take meds prior to ov and says she does the same when she sees Dr Allyson Sabal  Strongly advised not to do this unless specifically instructed by the doctor she's seeing that day and gave her the analogy of "it's like you wear glasses to visit the eye doctor cause he wants to know how they are working" and she seemed to [employer] accept that rec   F/u planned with Dr Allyson Sabal and will self monitor bp in meantime and contact him if not back down while on consistent meds

## 2017-11-16 NOTE — Progress Notes (Signed)
Subjective:    Patient ID: [name], female    DOB: [birth year]     MRN: [id number]    Brief patient profile:  33 yowf never smoker referred to pulmonary clinic 12/25/14 by Dr Renne Crigler with abn CT chest c/w UIP    History of Present Illness  12/25/2014 1st  Pulmonary office visit/ [name]   Chief Complaint  Patient presents with  . Pulmonary Consult    Referred by Dr. [name] for eval of abnormal cxr. Pt states she has had cough and DOE on and off for "years". Her cough is non prod.   onset cough /sob was insidious, minimally progressive severity x sev years with no h/o rhematism or chemo/ amio exposure  Cough is worse with exertion, no relation to meals and no h/o dysphagia, not noct/ always dry. rec gerd diet / f/u q 6 m    11/16/2016  f/u ov/[name] re:   PPI / on gerd diet/ ppi/h2hs only / no 02  Chief Complaint  Patient presents with  . Follow-up    CXR done today. Breathing is doing well and no new co's today.   Lots of gardening / [facility]  [facility] about a half a block to cafeteria nl pace s sob / no cough  rec No  Change rx    11/16/2017  f/u ov/[name] re:   PF just maint on gerd rx  Chief Complaint  Patient presents with  . Follow-up    Breathing is unchanged. No new co's.   Dyspnea:  really Not limited by breathing from desired activities   Cough: none   Still gardening/ walking to cafeteria/ no sob but not monitoring sats      No  obvious day to day or daytime variability or assoc excess/ purulent sputum or mucus plugs or hemoptysis or cp or chest tightness, subjective wheeze or overt sinus or hb symptoms. No unusual exposure hx or h/o childhood pna/ asthma or knowledge of premature birth.  Sleeping ok flat without nocturnal  or early am exacerbation  of respiratory  c/o's or need for noct saba. Also denies any obvious fluctuation of symptoms with weather or environmental changes or other aggravating or alleviating factors except as  outlined above   Current Allergies, Complete Past Medical History, Past Surgical History, Family History, and Social History were reviewed in [employer] record.  ROS  The following are not active complaints unless bolded Hoarseness, sore throat, dysphagia, dental problems, itching, sneezing,  nasal congestion or discharge of excess mucus or purulent secretions, ear ache,   fever, chills, sweats, unintended wt loss or wt gain, classically pleuritic or exertional cp,  orthopnea pnd or leg swelling, presyncope, palpitations, abdominal pain, anorexia, nausea, vomiting, diarrhea  or change in bowel habits or change in bladder habits, change in stools or change in urine, dysuria, hematuria,  rash, arthralgias, visual complaints, headache, numbness, weakness or ataxia or problems with walking or coordination,  change in mood/affect or memory.           Current Meds  Medication Sig  . aspirin EC 81 MG tablet Take 1 tablet (81 mg total) by mouth daily.  . calcium carbonate (CALCIUM 600) 600 MG TABS tablet Take 1,200-1,500 mg by mouth daily.  . Cholecalciferol (VITAMIN D-3) 1000 units CAPS Take 1,000 Units by mouth daily.  . famotidine (PEPCID) 20 MG tablet TAKE 1 TABLET BY MOUTH EVERYDAY AT BEDTIME  . isosorbide mononitrate (IMDUR) 60 MG  24 hr tablet Take 60 mg by mouth daily.   . Magnesium 500 MG TABS Take 500 mg by mouth daily.  . metoprolol (LOPRESSOR) 100 MG tablet Take 100 mg by mouth 2 (two) times daily.   . pantoprazole (PROTONIX) 40 MG tablet TAKE 1 TABLET (40 MG TOTAL) BY MOUTH DAILY. TAKE 30-60 MIN BEFORE FIRST MEAL OF THE DAY  . rOPINIRole (REQUIP) 1 MG tablet Take 1 mg by mouth daily with lunch.   [name] rOPINIRole (REQUIP) 2 MG tablet Take 2 mg by mouth at bedtime.  . rosuvastatin (CRESTOR) 10 MG tablet Take 10 mg by mouth at bedtime.                        Objective:   Physical Exam  amb wf nad     11/16/2017        126  11/16/2016        139 05/19/2016       138  10/31/2015        141   09/19/2015         144   12/25/14 143 lb 6.4 oz (65.046 kg)  09/24/14 150 lb (68.04 kg)  03/21/14 153 lb 14.4 oz (69.809 kg)     Vital signs reviewed - Note on arrival 02 sats  99% on RA - note bp 204/80 before am meds         HEENT: nl dentition, turbinates bilaterally, and oropharynx. Nl external ear canals without cough reflex   NECK :  without JVD/Nodes/TM/ nl carotid upstrokes bilaterally   LUNGS: no acc muscle use,  Nl contour chest with classic velcro crackles bases bilaterally without cough on insp or exp maneuvers   CV:  RRR  no s3 or murmur or increase in P2, and no edema   ABD:  soft and nontender with nl inspiratory excursion in the supine position. No bruits or organomegaly appreciated, bowel sounds nl  MS:  Nl gait/ ext warm without deformities, calf tenderness, cyanosis - no clubbing  No obvious joint restrictions   SKIN: warm and dry without lesions    NEURO:  alert, approp, nl sensorium with  no motor or cerebellar deficits apparent.                 Assessment & Plan:

## 2017-11-16 NOTE — Assessment & Plan Note (Signed)
-   present on CT chest 12/29/2010   - 12/25/2014  Walked RA x 3 laps @ 185 ft each stopped due to  End of study, nl sats, min sob  - PFTs  02/06/2015  VC 1.96 (94%) s obst and dlco 47 corrects to 66% > rec ov 6 m to repeat walking sats call sooner if losing ground - PFTs 09/18/15         VC 1.93(93%) s obst and dlco 38/39c correctst to 68%  - 09/19/2015   Walked RA  2 laps @ 185 ft each stopped due to  Fatigue/ sats 88% at moderate pace  - 10/09/15 rx gerd diet/ ppi/h2 hs  -  PFT's  05/19/2016  VC  1.89  (93%)  with DLCO  41/42 % corrects to 61  % for alv volume - 05/19/2016   Walked RA  2 laps @ 185 ft each stopped due to fatigue/ sats 94% moderate pace    - 11/16/2016  Walked RA x 3 laps @ 185 ft each stopped due to  End of study, nl pace, no sob or desat   At nl pace    I had an extended final summary discussion with the patient reviewing all relevant studies completed to date and  lasting 15 to 20 minutes of a 25 minute visit on the following issues:   1) no clinical progression of dz now at age 84, 7 years after dx, with all symptoms eliminated by using aggressive gerd rx which she should continue  2) should stay active and monitor sats once a week or so or at least report if subj sob develops during her daily routine, which is surprisingly spry for a pt with pf at age 65  3) pulmonary f/u can be prn   4) Each maintenance medication was reviewed in detail including most importantly the difference between maintenance and as needed and under what circumstances the prns are to be used.  Please see AVS for specific  Instructions which are unique to this visit and I personally typed out  which were reviewed in detail in writing with the patient and a copy provided.

## 2017-12-29 DIAGNOSIS — R5383 Other fatigue: Secondary | ICD-10-CM | POA: Diagnosis not present

## 2017-12-29 DIAGNOSIS — R634 Abnormal weight loss: Secondary | ICD-10-CM | POA: Diagnosis not present

## 2017-12-29 DIAGNOSIS — I10 Essential (primary) hypertension: Secondary | ICD-10-CM | POA: Diagnosis not present

## 2017-12-29 DIAGNOSIS — R630 Anorexia: Secondary | ICD-10-CM | POA: Diagnosis not present

## 2018-01-11 DIAGNOSIS — E7800 Pure hypercholesterolemia, unspecified: Secondary | ICD-10-CM | POA: Diagnosis not present

## 2018-01-11 DIAGNOSIS — G2581 Restless legs syndrome: Secondary | ICD-10-CM | POA: Diagnosis not present

## 2018-01-11 DIAGNOSIS — I10 Essential (primary) hypertension: Secondary | ICD-10-CM | POA: Diagnosis not present

## 2018-01-27 DIAGNOSIS — G2581 Restless legs syndrome: Secondary | ICD-10-CM | POA: Diagnosis not present

## 2018-01-27 DIAGNOSIS — I10 Essential (primary) hypertension: Secondary | ICD-10-CM | POA: Diagnosis not present

## 2018-01-27 DIAGNOSIS — R634 Abnormal weight loss: Secondary | ICD-10-CM | POA: Diagnosis not present

## 2018-01-27 DIAGNOSIS — Z9181 History of falling: Secondary | ICD-10-CM | POA: Diagnosis not present

## 2018-02-03 DIAGNOSIS — H04123 Dry eye syndrome of bilateral lacrimal glands: Secondary | ICD-10-CM | POA: Diagnosis not present

## 2018-02-03 DIAGNOSIS — H532 Diplopia: Secondary | ICD-10-CM | POA: Diagnosis not present

## 2018-02-11 DIAGNOSIS — M6281 Muscle weakness (generalized): Secondary | ICD-10-CM | POA: Diagnosis not present

## 2018-02-11 DIAGNOSIS — Z9181 History of falling: Secondary | ICD-10-CM | POA: Diagnosis not present

## 2018-02-11 DIAGNOSIS — R2681 Unsteadiness on feet: Secondary | ICD-10-CM | POA: Diagnosis not present

## 2018-02-15 ENCOUNTER — Ambulatory Visit (HOSPITAL_COMMUNITY)
Admission: RE | Admit: 2018-02-15 | Discharge: 2018-02-15 | Disposition: A | Discharge status: Home / Self Care | Payer: Medicare Other | Source: Ambulatory Visit | Attending: Cardiology | Admitting: Cardiology

## 2018-02-15 DIAGNOSIS — I150 Renovascular hypertension: Secondary | ICD-10-CM | POA: Diagnosis not present

## 2018-02-15 DIAGNOSIS — I701 Atherosclerosis of renal artery: Secondary | ICD-10-CM | POA: Insufficient documentation

## 2018-02-17 DIAGNOSIS — R2681 Unsteadiness on feet: Secondary | ICD-10-CM | POA: Diagnosis not present

## 2018-02-17 DIAGNOSIS — Z9181 History of falling: Secondary | ICD-10-CM | POA: Diagnosis not present

## 2018-02-17 DIAGNOSIS — M6281 Muscle weakness (generalized): Secondary | ICD-10-CM | POA: Diagnosis not present

## 2018-02-18 DIAGNOSIS — R2681 Unsteadiness on feet: Secondary | ICD-10-CM | POA: Diagnosis not present

## 2018-02-18 DIAGNOSIS — M6281 Muscle weakness (generalized): Secondary | ICD-10-CM | POA: Diagnosis not present

## 2018-02-18 DIAGNOSIS — Z9181 History of falling: Secondary | ICD-10-CM | POA: Diagnosis not present

## 2018-02-23 DIAGNOSIS — M6281 Muscle weakness (generalized): Secondary | ICD-10-CM | POA: Diagnosis not present

## 2018-02-23 DIAGNOSIS — R2681 Unsteadiness on feet: Secondary | ICD-10-CM | POA: Diagnosis not present

## 2018-02-23 DIAGNOSIS — Z9181 History of falling: Secondary | ICD-10-CM | POA: Diagnosis not present

## 2018-02-25 DIAGNOSIS — Z9181 History of falling: Secondary | ICD-10-CM | POA: Diagnosis not present

## 2018-02-25 DIAGNOSIS — R2681 Unsteadiness on feet: Secondary | ICD-10-CM | POA: Diagnosis not present

## 2018-02-25 DIAGNOSIS — M6281 Muscle weakness (generalized): Secondary | ICD-10-CM | POA: Diagnosis not present

## 2018-03-03 DIAGNOSIS — Z9181 History of falling: Secondary | ICD-10-CM | POA: Diagnosis not present

## 2018-03-03 DIAGNOSIS — M6281 Muscle weakness (generalized): Secondary | ICD-10-CM | POA: Diagnosis not present

## 2018-03-03 DIAGNOSIS — R2681 Unsteadiness on feet: Secondary | ICD-10-CM | POA: Diagnosis not present

## 2018-03-04 DIAGNOSIS — R2681 Unsteadiness on feet: Secondary | ICD-10-CM | POA: Diagnosis not present

## 2018-03-04 DIAGNOSIS — M6281 Muscle weakness (generalized): Secondary | ICD-10-CM | POA: Diagnosis not present

## 2018-03-04 DIAGNOSIS — Z9181 History of falling: Secondary | ICD-10-CM | POA: Diagnosis not present

## 2018-03-10 DIAGNOSIS — J8410 Pulmonary fibrosis, unspecified: Secondary | ICD-10-CM | POA: Diagnosis not present

## 2018-03-10 DIAGNOSIS — R634 Abnormal weight loss: Secondary | ICD-10-CM | POA: Diagnosis not present

## 2018-03-10 DIAGNOSIS — I10 Essential (primary) hypertension: Secondary | ICD-10-CM | POA: Diagnosis not present

## 2018-03-11 DIAGNOSIS — R2681 Unsteadiness on feet: Secondary | ICD-10-CM | POA: Diagnosis not present

## 2018-03-11 DIAGNOSIS — Z9181 History of falling: Secondary | ICD-10-CM | POA: Diagnosis not present

## 2018-03-11 DIAGNOSIS — M6281 Muscle weakness (generalized): Secondary | ICD-10-CM | POA: Diagnosis not present

## 2018-03-16 DIAGNOSIS — Z9181 History of falling: Secondary | ICD-10-CM | POA: Diagnosis not present

## 2018-03-16 DIAGNOSIS — R2681 Unsteadiness on feet: Secondary | ICD-10-CM | POA: Diagnosis not present

## 2018-03-16 DIAGNOSIS — M6281 Muscle weakness (generalized): Secondary | ICD-10-CM | POA: Diagnosis not present

## 2018-03-18 DIAGNOSIS — M6281 Muscle weakness (generalized): Secondary | ICD-10-CM | POA: Diagnosis not present

## 2018-03-18 DIAGNOSIS — Z9181 History of falling: Secondary | ICD-10-CM | POA: Diagnosis not present

## 2018-03-18 DIAGNOSIS — R2681 Unsteadiness on feet: Secondary | ICD-10-CM | POA: Diagnosis not present

## 2018-05-10 DIAGNOSIS — M810 Age-related osteoporosis without current pathological fracture: Secondary | ICD-10-CM | POA: Diagnosis not present

## 2018-06-08 ENCOUNTER — Ambulatory Visit (HOSPITAL_COMMUNITY)
Admission: RE | Admit: 2018-06-08 | Discharge: 2018-06-08 | Disposition: A | Discharge status: Home / Self Care | Payer: Medicare Other | Source: Ambulatory Visit | Attending: Internal Medicine | Admitting: Internal Medicine

## 2018-06-08 DIAGNOSIS — I6523 Occlusion and stenosis of bilateral carotid arteries: Secondary | ICD-10-CM | POA: Insufficient documentation

## 2018-06-13 ENCOUNTER — Other Ambulatory Visit: Payer: Self-pay | Admitting: *Deleted

## 2018-06-13 DIAGNOSIS — I6523 Occlusion and stenosis of bilateral carotid arteries: Secondary | ICD-10-CM

## 2018-08-08 DIAGNOSIS — H532 Diplopia: Secondary | ICD-10-CM | POA: Diagnosis not present

## 2018-08-08 DIAGNOSIS — H524 Presbyopia: Secondary | ICD-10-CM | POA: Diagnosis not present

## 2018-09-20 DIAGNOSIS — H532 Diplopia: Secondary | ICD-10-CM | POA: Diagnosis not present

## 2018-09-22 DIAGNOSIS — I10 Essential (primary) hypertension: Secondary | ICD-10-CM | POA: Diagnosis not present

## 2018-09-22 DIAGNOSIS — E7800 Pure hypercholesterolemia, unspecified: Secondary | ICD-10-CM | POA: Diagnosis not present

## 2018-09-22 DIAGNOSIS — N390 Urinary tract infection, site not specified: Secondary | ICD-10-CM | POA: Diagnosis not present

## 2018-09-22 DIAGNOSIS — R413 Other amnesia: Secondary | ICD-10-CM | POA: Diagnosis not present

## 2018-09-26 DIAGNOSIS — N3000 Acute cystitis without hematuria: Secondary | ICD-10-CM | POA: Diagnosis not present

## 2018-09-26 DIAGNOSIS — I2510 Atherosclerotic heart disease of native coronary artery without angina pectoris: Secondary | ICD-10-CM | POA: Diagnosis not present

## 2018-09-26 DIAGNOSIS — R413 Other amnesia: Secondary | ICD-10-CM | POA: Diagnosis not present

## 2018-09-26 DIAGNOSIS — I739 Peripheral vascular disease, unspecified: Secondary | ICD-10-CM | POA: Diagnosis not present

## 2018-09-26 DIAGNOSIS — I10 Essential (primary) hypertension: Secondary | ICD-10-CM | POA: Diagnosis not present

## 2018-09-26 DIAGNOSIS — I701 Atherosclerosis of renal artery: Secondary | ICD-10-CM | POA: Diagnosis not present

## 2018-09-26 DIAGNOSIS — Z0000 Encounter for general adult medical examination without abnormal findings: Secondary | ICD-10-CM | POA: Diagnosis not present

## 2018-09-26 DIAGNOSIS — G2581 Restless legs syndrome: Secondary | ICD-10-CM | POA: Diagnosis not present

## 2018-09-26 DIAGNOSIS — J8410 Pulmonary fibrosis, unspecified: Secondary | ICD-10-CM | POA: Diagnosis not present

## 2018-10-06 ENCOUNTER — Ambulatory Visit: Payer: Medicare Other | Admitting: Neurology

## 2018-10-20 DIAGNOSIS — Z79899 Other long term (current) drug therapy: Secondary | ICD-10-CM | POA: Diagnosis not present

## 2018-10-20 DIAGNOSIS — I701 Atherosclerosis of renal artery: Secondary | ICD-10-CM | POA: Diagnosis not present

## 2018-10-20 DIAGNOSIS — Z9181 History of falling: Secondary | ICD-10-CM | POA: Diagnosis not present

## 2018-10-20 DIAGNOSIS — R413 Other amnesia: Secondary | ICD-10-CM | POA: Diagnosis not present

## 2018-10-20 DIAGNOSIS — I129 Hypertensive chronic kidney disease with stage 1 through stage 4 chronic kidney disease, or unspecified chronic kidney disease: Secondary | ICD-10-CM | POA: Diagnosis not present

## 2018-11-07 DIAGNOSIS — I129 Hypertensive chronic kidney disease with stage 1 through stage 4 chronic kidney disease, or unspecified chronic kidney disease: Secondary | ICD-10-CM | POA: Diagnosis not present

## 2018-11-07 DIAGNOSIS — G2581 Restless legs syndrome: Secondary | ICD-10-CM | POA: Diagnosis not present

## 2018-11-07 DIAGNOSIS — I2510 Atherosclerotic heart disease of native coronary artery without angina pectoris: Secondary | ICD-10-CM | POA: Diagnosis not present

## 2018-11-07 DIAGNOSIS — R413 Other amnesia: Secondary | ICD-10-CM | POA: Diagnosis not present

## 2018-11-16 DIAGNOSIS — M810 Age-related osteoporosis without current pathological fracture: Secondary | ICD-10-CM | POA: Diagnosis not present

## 2019-01-05 ENCOUNTER — Telehealth (INDEPENDENT_AMBULATORY_CARE_PROVIDER_SITE_OTHER): Payer: Medicare Other | Admitting: Cardiovascular Disease

## 2019-01-05 ENCOUNTER — Telehealth: Payer: Self-pay

## 2019-01-05 DIAGNOSIS — E782 Mixed hyperlipidemia: Secondary | ICD-10-CM

## 2019-01-05 DIAGNOSIS — I10 Essential (primary) hypertension: Secondary | ICD-10-CM | POA: Diagnosis not present

## 2019-01-05 NOTE — Telephone Encounter (Signed)
Patient and/or DPR-approved person aware of 6/18 AVS instructions and verbalized understanding.  Letter including After Visit Summary and any other necessary documents to be mailed to the patient's address on file.    

## 2019-01-05 NOTE — Patient Instructions (Signed)
Medication Instructions:  Your physician recommends that you continue on your current medications as directed. Please refer to the Current Medication list given to you today.  If you need a refill on your cardiac medications before your next appointment, please call your pharmacy.   Lab work: NONE If you have labs (blood work) drawn today and your tests are completely normal, you will receive your results only by: [name] MyChart Message (if you have MyChart) OR . A paper copy in the mail If you have any lab test that is abnormal or we need to change your treatment, we will call you to review the results.  Tests/Procedures: NONE  Follow-Up: At [facility], you and your health needs are our priority.  As part of our continuing mission to provide you with exceptional heart care, we have created designated Provider Care Teams.  These Care Teams include your primary Cardiologist (physician) and Advanced Practice Providers (APPs -  Physician Assistants and Nurse Practitioners) who all work together to provide you with the care you need, when you need it. . You may schedule a follow up appointment AS NEEDED. You may see Dr. Gwenlyn Found or one of the following Advanced Practice Providers on your designated Care Team:   . Kerin Ransom, [place] . Almyra Deforest, PA-C . Fabian Sharp, PA-C . Jory Sims, DNP . Rosaria Ferries, PA-C . Roby Lofts, PA-C . Sande Rives, PA-C

## 2019-01-05 NOTE — Progress Notes (Signed)
Virtual Visit via Telephone Note   This visit type was conducted due to national recommendations for restrictions regarding the COVID-19 Pandemic (e.g. social distancing) in an effort to limit this patient's exposure and mitigate transmission in our community.  Due to her co-morbid illnesses, this patient is at least at moderate risk for complications without adequate follow up.  This format is felt to be most appropriate for this patient at this time.  The patient did not have access to video technology/had technical difficulties with video requiring transitioning to audio format only (telephone).  All issues noted in this document were discussed and addressed.  No physical exam could be performed with this format.  Please refer to the patient's chart for her  consent to telehealth for [facility].   Date:  01/05/2019   ID:  [name], DOB 06/08/1927, MRN [id number]  Patient Location: Home Provider Location: Home  PCP:  Merri BrunettePharr, Walter, MD  Cardiologist: Dr. Royann Shiversroitoru Peripheral vascular cardiologist: Dr. Nanetta BattyJonathan Normalee Sistare Electrophysiologist:  None   Evaluation Performed:  Follow-Up Visit  Chief Complaint: Hypertension  History of Present Illness:    [name] is a 85 y.o.  widowed Caucasian female mother of 2 with no grandchildren's second husband passed away 15 years ago.I last saw her in the office 08/18/2017 [place] She was referred to me by Dr. Royann Shiversroitoru for evaluation of treatment of renal vascular hypertension. She has a history of noncritical CAD by cath in 2005 at which time he demonstrated a significant right renal artery stenosis which has been followed by duplex ultrasound since. Most recently 05/04/17 renal Doppler suggest progression of disease with a renal aortic ratio of 5. Her right renal dimension has remained stable. Her renal function is normal as well. She was on multiple antihypertensive medications which were discontinued by  her PCP for unknown reasons. She was seen in the emergency room earlier this month with significant hypertension and a headache. She has had bilateral carotid endarterectomies remotely.  Since I saw her a year and a half ago, she is done well.  Her blood pressure today is 152/64 only on losartan and metoprolol.  She denies chest pain or shortness of breath.  The patient does not have symptoms concerning for COVID-19 infection (fever, chills, cough, or new shortness of breath).    Past Medical History:  Diagnosis Date  . Anemia   . Carotid arterial disease (HCC)    asymptomatic   . Chest tightness   . Coronary artery disease   . High blood pressure   . Leg pain    with walking  . Palpitations    Past Surgical History:  Procedure Laterality Date  . CARDIAC CATHETERIZATION  07/15/04   noncritical CAD,80% PLA treated medically. EF% 60. no RAS.  [name]. CAROTID ENDARTERECTOMY     RIGHT=05/11/11, LEFT=03/30/11  . EYE SURGERY  2011   Cataract surgery Bilateral  . EYE SURGERY  2011   Bilateral eyelid surgery for ptosis  . renal artery duplex  02/23/13   ABDOMINAL AORTA:<50% DIAMETER REDUCTION. RIGHT RENAL ARTERY: 60-99% DIAMETER REDUCTION.[name]. LEFT RENAL ARTERY:1-59% DIAMETER REDUCTION.     Current Meds  Medication Sig  . aspirin EC 81 MG tablet Take 1 tablet (81 mg total) by mouth daily.  . calcium carbonate (CALCIUM 600) 600 MG TABS tablet Take 1,200-1,500 mg by mouth daily.  . Cholecalciferol (VITAMIN D-3) 1000 units CAPS Take 1,000 Units by mouth daily.  . famotidine (PEPCID) 20 MG tablet  TAKE 1 TABLET BY MOUTH EVERYDAY AT BEDTIME  . isosorbide mononitrate (IMDUR) 60 MG 24 hr tablet Take 60 mg by mouth daily.   . Magnesium 500 MG TABS Take 500 mg by mouth daily.  . metoprolol (LOPRESSOR) 100 MG tablet Take 100 mg by mouth 2 (two) times daily.   . pantoprazole (PROTONIX) 40 MG tablet TAKE 1 TABLET (40 MG TOTAL) BY MOUTH DAILY. TAKE 30-60 MIN BEFORE FIRST MEAL OF THE DAY  . rOPINIRole  (REQUIP) 1 MG tablet Take 1 mg by mouth daily with lunch.   [name] rOPINIRole (REQUIP) 2 MG tablet Take 2 mg by mouth at bedtime.  . rosuvastatin (CRESTOR) 10 MG tablet Take 10 mg by mouth at bedtime.      Allergies:   Cardura [doxazosin mesylate], Lotensin [benazepril hcl], and Tramadol   Social History   Tobacco Use  . Smoking status: Never Smoker  . Smokeless tobacco: Never Used  Substance Use Topics  . Alcohol use: No  . Drug use: No     Family Hx: The patient's family history includes Cancer in her sister; Heart disease in her brother and father; Lung disease in her mother.  ROS:   Please see the history of present illness.     All other systems reviewed and are negative.   Prior CV studies:   The following studies were reviewed today:  None  Labs/Other Tests and Data Reviewed:    EKG:  No ECG reviewed.  Recent Labs: No results found for requested labs within last 8760 hours.   Recent Lipid Panel No results found for: CHOL, TRIG, HDL, CHOLHDL, LDLCALC, LDLDIRECT  Wt Readings from Last 3 Encounters:  01/05/19 120 lb (54.4 kg)  11/16/17 126 lb (57.2 kg)  08/18/17 129 lb 12.8 oz (58.9 kg)     Objective:    Vital Signs:  BP (!) 152/64   Pulse (!) 50   Ht 5\' 3"  (1.6 m)   Wt 120 lb (54.4 kg)   BMI 21.26 kg/m    VITAL SIGNS:  reviewed a complete physical exam was not performed today since this was a virtual telemedicine phone visit  ASSESSMENT & PLAN:    1. Essential hypertension- history of essential hypertension blood pressure measured today by the patient at home of 152/64 with a pulse of 50.  She is on losartan and metoprolol.  Renal Doppler studies performed 02/15/2018 showed no evidence of renal artery stenosis 2. Hyperlipidemia- history of hyperlipidemia on Crestor with lipid profile performed 09/17/2017 revealing total cholesterol 115, LDL 65 and HDL 36.  COVID-19 Education: The signs and symptoms of COVID-19 were discussed with the patient and how to  seek care for testing (follow up with PCP or arrange E-visit).  The importance of social distancing was discussed today.  Time:   Today, I have spent 3 minutes with the patient with telehealth technology discussing the above problems.     Medication Adjustments/Labs and Tests Ordered: Current medicines are reviewed at length with the patient today.  Concerns regarding medicines are outlined above.   Tests Ordered: No orders of the defined types were placed in this encounter.   Medication Changes: No orders of the defined types were placed in this encounter.   Follow Up:  Virtual Visit prn  Signed, Quay Burow, MD  01/05/2019 2:30 PM    [place]

## 2019-05-30 DIAGNOSIS — M810 Age-related osteoporosis without current pathological fracture: Secondary | ICD-10-CM | POA: Diagnosis not present

## 2019-06-09 ENCOUNTER — Encounter (HOSPITAL_COMMUNITY): Payer: Medicare Other

## 2019-06-12 ENCOUNTER — Ambulatory Visit (HOSPITAL_COMMUNITY): Payer: Medicare Other

## 2019-07-24 DIAGNOSIS — Z23 Encounter for immunization: Secondary | ICD-10-CM | POA: Diagnosis not present

## 2019-08-21 DIAGNOSIS — Z23 Encounter for immunization: Secondary | ICD-10-CM | POA: Diagnosis not present

## 2019-09-28 DIAGNOSIS — I10 Essential (primary) hypertension: Secondary | ICD-10-CM | POA: Diagnosis not present

## 2019-09-28 DIAGNOSIS — N390 Urinary tract infection, site not specified: Secondary | ICD-10-CM | POA: Diagnosis not present

## 2019-09-28 DIAGNOSIS — E7800 Pure hypercholesterolemia, unspecified: Secondary | ICD-10-CM | POA: Diagnosis not present

## 2019-10-03 DIAGNOSIS — R634 Abnormal weight loss: Secondary | ICD-10-CM | POA: Diagnosis not present

## 2019-10-03 DIAGNOSIS — I129 Hypertensive chronic kidney disease with stage 1 through stage 4 chronic kidney disease, or unspecified chronic kidney disease: Secondary | ICD-10-CM | POA: Diagnosis not present

## 2019-10-03 DIAGNOSIS — J8410 Pulmonary fibrosis, unspecified: Secondary | ICD-10-CM | POA: Diagnosis not present

## 2019-10-03 DIAGNOSIS — G2581 Restless legs syndrome: Secondary | ICD-10-CM | POA: Diagnosis not present

## 2019-10-03 DIAGNOSIS — Z0000 Encounter for general adult medical examination without abnormal findings: Secondary | ICD-10-CM | POA: Diagnosis not present

## 2019-10-03 DIAGNOSIS — I2510 Atherosclerotic heart disease of native coronary artery without angina pectoris: Secondary | ICD-10-CM | POA: Diagnosis not present

## 2019-10-03 DIAGNOSIS — R413 Other amnesia: Secondary | ICD-10-CM | POA: Diagnosis not present

## 2019-10-03 DIAGNOSIS — M810 Age-related osteoporosis without current pathological fracture: Secondary | ICD-10-CM | POA: Diagnosis not present

## 2019-10-03 DIAGNOSIS — K219 Gastro-esophageal reflux disease without esophagitis: Secondary | ICD-10-CM | POA: Diagnosis not present

## 2019-10-03 DIAGNOSIS — E7800 Pure hypercholesterolemia, unspecified: Secondary | ICD-10-CM | POA: Diagnosis not present

## 2019-10-03 DIAGNOSIS — I739 Peripheral vascular disease, unspecified: Secondary | ICD-10-CM | POA: Diagnosis not present

## 2019-11-02 ENCOUNTER — Other Ambulatory Visit: Payer: Self-pay

## 2019-11-02 ENCOUNTER — Ambulatory Visit (INDEPENDENT_AMBULATORY_CARE_PROVIDER_SITE_OTHER): Payer: Medicare Other | Admitting: Neurology

## 2019-11-02 ENCOUNTER — Encounter: Payer: Self-pay | Admitting: Neurology

## 2019-11-02 ENCOUNTER — Telehealth: Payer: Self-pay | Admitting: Neurology

## 2019-11-02 VITALS — BP 140/55 | HR 89 | Ht 62.0 in | Wt 101.0 lb

## 2019-11-02 DIAGNOSIS — R413 Other amnesia: Secondary | ICD-10-CM

## 2019-11-02 DIAGNOSIS — F0390 Unspecified dementia without behavioral disturbance: Secondary | ICD-10-CM

## 2019-11-02 NOTE — Patient Instructions (Addendum)
You have complaints of memory loss: memory loss or changes in cognitive function can have many reasons and does not always mean you have dementia. Conditions that can contribute to subjective or objective memory loss include: depression, stress, poor sleep from insomnia or sleep apnea, dehydration, fluctuation in blood sugar values, thyroid or electrolyte dysfunction and certain vitamin deficiencies. Dementia can be caused by stroke, brain atherosclerosis or brain vascular disease due to vascular risk factors (smoking, high blood pressure, high cholesterol, obesity and uncontrolled diabetes), certain degenerative brain disorders (including Parkinson's disease and Multiple sclerosis) and by Alzheimer's disease or other, more rare and sometimes hereditary causes. We will do some additional testing: blood work (which we will do today) and we will do a brain scan. We will not start medication as yet.  We will monitor your symptoms and follow up in 3 months.

## 2019-11-02 NOTE — Telephone Encounter (Signed)
Medicare/bcbs supp order sent to GI. No auth they will reach out to the patient to schedule.  

## 2019-11-02 NOTE — Progress Notes (Signed)
Subjective:    Patient ID: [name] is a 86 y.o. female.  HPI     Huston Foley, MD, PhD [facility] [address] [address] [place], [place] [address]  Dear Dr. Renne Crigler,   I saw your patient, Suriah Peragine, Upon your kind request in my neurologic clinic today for initial consultation of her memory loss.  The patient is accompanied by her friend, Joycie Peek, of 40 years (not on [place]), today.  As you know, Ms. Constance Goltz is a 86 year old right-handed woman with an underlying medical history of hyperlipidemia, hypertension, coronary artery disease, mitral valve prolapse, restless leg syndrome, hiatal hernia, osteopenia, osteoarthritis, low back pain, status post bilateral carotid endarterectomies, who reports short-term memory issues for the past few months or few years, unclear how long.  The patient is not able to give many details to her history.  Her friend who also lives at friend's home and independent living in a different building helps with history.  The patient has had word finding difficulties, some 2 to 3 weeks ago she had some confusion while in her own home.  She has not fallen.  She uses a walker but did not bring it in today.  She has no obvious family history of dementia.  She had one full sister who passed away, 2 half siblings from her father's side, all older, also passed away.  She is the youngest of all children.  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  She is widowed for years, probably over 9 years and has been living in independent living at friend's home.  She has 2 sons, one lives in [place] and the other [place] but per friend he lives in [place].  She has lost weight in the recent past.  She is a non-smoker and does not drink alcohol and drinks caffeine in the form of coffee, 2 cups/day on average.  She reports a reasonably good appetite and tries to hydrate with water.  I  reviewed your office note from 10/03/2019.  She had recent blood work through your office from 09/28/2019 and I reviewed the results: CBC with differential was unremarkable, CMP showed BUN of 32, creatinine of 1, total cholesterol 129, triglycerides 57, LDL 73.  Urinalysis was benign.  She had a head CT without contrast on 09/29/2017 with indication of memory loss, and I reviewed the results: Impression: Mild to moderate atrophy.  No acute abnormality.  Mucoperiosteal thickening right maxillary sinus.  Her Past Medical History Is Significant For: Past Medical History:  Diagnosis Date  . Anemia   . Carotid arterial disease (HCC)    asymptomatic   . Chest tightness   . Coronary artery disease   . High blood pressure   . Leg pain    with walking  . Palpitations     Her Past Surgical History Is Significant For: Past Surgical History:  Procedure Laterality Date  . CARDIAC CATHETERIZATION  07/15/04   noncritical CAD,80% PLA treated medically. EF% 60. no RAS.  [name] CAROTID ENDARTERECTOMY     RIGHT=05/11/11, LEFT=03/30/11  . EYE SURGERY  2011   Cataract surgery Bilateral  . EYE SURGERY  2011   Bilateral eyelid surgery for ptosis  . renal artery duplex  02/23/13   ABDOMINAL AORTA:<50% DIAMETER REDUCTION. RIGHT RENAL ARTERY: 60-99% DIAMETER REDUCTION.[name] LEFT RENAL ARTERY:1-59% DIAMETER REDUCTION.    Her Family History Is  Significant For: Family History  Problem Relation Age of Onset  . Lung disease Mother   . Heart disease Father   . Cancer Sister        liver cancer  . Heart disease Brother     Her Social History Is Significant For: Social History   Socioeconomic History  . Marital status: Widowed    Spouse name: Not on file  . Number of children: Not on file  . Years of education: Not on file  . Highest education level: Not on file  Occupational History  . Not on file  Tobacco Use  . Smoking status: Never Smoker  . Smokeless tobacco: Never Used  Substance and Sexual Activity  .  Alcohol use: No  . Drug use: No  . Sexual activity: Not on file  Other Topics Concern  . Not on file  Social History Narrative  . Not on file   Social Determinants of Health   Financial Resource Strain:   . Difficulty of Paying Living Expenses:   Food Insecurity:   . Worried About Programme researcher, broadcasting/film/video in the Last Year:   . Barista in the Last Year:   Transportation Needs:   . Freight forwarder (Medical):   [name] Lack of Transportation (Non-Medical):   Physical Activity:   . Days of Exercise per Week:   . Minutes of Exercise per Session:   Stress:   . Feeling of Stress :   Social Connections:   . Frequency of Communication with Friends and Family:   . Frequency of Social Gatherings with Friends and Family:   . Attends Religious Services:   . Active Member of Clubs or Organizations:   . Attends Banker Meetings:   [name] Marital Status:     Her Allergies Are:  Allergies  Allergen Reactions  . Cardura [Doxazosin Mesylate]     Reaction not recalled by the patient  . Lotensin [Benazepril Hcl]     Reaction not recalled by the patient  . Tramadol Nausea And Vomiting  :   Her Current Medications Are:  Outpatient Encounter Medications as of 11/02/2019  Medication Sig  . aspirin EC 81 MG tablet Take 1 tablet (81 mg total) by mouth daily.  . famotidine (PEPCID) 20 MG tablet TAKE 1 TABLET BY MOUTH EVERYDAY AT BEDTIME  . Magnesium 500 MG TABS Take 500 mg by mouth daily.  . metoprolol (LOPRESSOR) 100 MG tablet Take 100 mg by mouth 2 (two) times daily.   . pantoprazole (PROTONIX) 40 MG tablet TAKE 1 TABLET (40 MG TOTAL) BY MOUTH DAILY. TAKE 30-60 MIN BEFORE FIRST MEAL OF THE DAY  . rOPINIRole (REQUIP) 1 MG tablet Take 1-2 mg by mouth every evening.   . rosuvastatin (CRESTOR) 10 MG tablet Take 10 mg by mouth at bedtime.   . valsartan-hydrochlorothiazide (DIOVAN-HCT) 160-25 MG tablet Take 1 tablet by mouth daily.  . [DISCONTINUED] calcium carbonate (CALCIUM 600) 600  MG TABS tablet Take 1,200-1,500 mg by mouth daily.  . [DISCONTINUED] Cholecalciferol (VITAMIN D-3) 1000 units CAPS Take 1,000 Units by mouth daily.  . [DISCONTINUED] isosorbide mononitrate (IMDUR) 60 MG 24 hr tablet Take 60 mg by mouth daily.   . [DISCONTINUED] losartan (COZAAR) 25 MG tablet Take 1 tablet (25 mg total) by mouth daily.  . [DISCONTINUED] rOPINIRole (REQUIP) 2 MG tablet Take 2 mg by mouth at bedtime.   No facility-administered encounter medications on file as of 11/02/2019.  :   Review of Systems:  Out of a complete 14 point review of systems, all are reviewed and negative with the exception of these symptoms as listed below:  Review of Systems  Neurological:       Pt presents today to discuss her memory.    Objective:  Neurological Exam  Physical Exam Physical Examination:   Vitals:   11/02/19 1023  BP: (!) 140/55  Pulse: 89    General Examination: The patient is a very pleasant 86 y.o. female in no acute distress. She appears well-developed and well-nourished and well groomed.   HEENT: Normocephalic, atraumatic, pupils are equal, round and reactive to light, she is status post cataract repairs, she has difficulty tracking.  Hearing is grossly intact.  Face is symmetric with normal facial animation and normal facial sensation, no dysarthria is noted. There is no hypophonia. There is no lip, neck/head, jaw or voice tremor. Neck is supple with full range of passive and active motion. There are no carotid bruits on auscultation. Oropharynx exam reveals: moderate mouth dryness, adequate dental hygiene. Tongue protrudes centrally and palate elevates symmetrically.   Chest: Clear to auscultation without wheezing, rhonchi or crackles noted.  Heart: S1+S2+0, regular and normal without murmurs, rubs or gallops noted.   Abdomen: Soft, non-tender and non-distended with normal bowel sounds appreciated on auscultation.  Extremities: There is no pitting edema in the distal  lower extremities bilaterally. Pedal pulses are intact.  Skin: Warm and dry without trophic changes noted.  Musculoskeletal: exam reveals no obvious joint deformities, tenderness or joint swelling or erythema.   Neurologically:  Mental status: The patient is awake, alert and pays good attention. Her immediate and remote memory, attention, language skills and fund of knowledge are impaired.  Mood and affect normal. On 11/02/2019: MMSE: 16/30, CDT: 4/4, AFT: 6/min. Cranial nerves II - XII are as described above under HEENT exam. In addition: shoulder shrug is normal with equal shoulder height noted. Motor exam: Thin built, global strength normal for age, no tremor noted, reflexes are 1+ throughout.  Romberg is not tested for safety reasons.  Fine motor skills are grossly intact.   Cerebellar testing: No dysmetria or intention tremor.   Sensory exam: intact to light touch in the upper and lower extremities.  Gait, station and balance: She stands without difficulty and posture is age-appropriate.  She walks slightly slowly and cautiously, did not bring her walker today.  Has difficulty with turns and insecurity and needed to hold on and some assistance needed with turns.  Balance is impaired.   Assessment and Plan:   In summary, DESTENEE GUERRY is a very pleasant 86 y.o.-year old female with an underlying medical history of hyperlipidemia, hypertension, coronary artery disease, mitral valve prolapse, restless leg syndrome, hiatal hernia, osteopenia, osteoarthritis, low back pain, status post bilateral carotid endarterectomies, who presents for evaluation of her memory loss of several months duration, maybe longer.  She does not have a telltale family history of dementia, a more detailed history is also not possible.  Her friend who knows her for many years reports that she has noticed patient's forgetfulness and confusion in the past few months.  She had recent blood work through your office.  I  suggested additional blood work today.  In addition, I would like to proceed with a brain MRI without contrast.  Recent blood work through your office indicated elevated BUN, in keeping with less optimal hydration with water.  She is encouraged to drink more water, stay well rested, and use her walker  at all times as she was also noted to be off balance and not fully stable to walk without a walking aid.  We will keep her posted as to her test results by phone call.  I did not suggest any new medications quite yet.  We may start her on a memory medication such as donepezil.  I plan to see her back in about 3 months.  We talked about the importance of fall prevention and healthy lifestyle.  She is advised to call with any interim questions or concerns.  Thank you very much for allowing me to participate in the care of this nice patient. If I can be of any further assistance to you please do not hesitate to call me at [phone].  Sincerely,   Star Age, MD, PhD

## 2019-11-03 LAB — TSH: TSH: 2.13 u[IU]/mL (ref 0.450–4.500)

## 2019-11-03 LAB — B12 AND FOLATE PANEL
Folate: 11.5 ng/mL (ref >3.0)
Vitamin B-12: 1386 pg/mL — ABNORMAL HIGH (ref 232–1245)

## 2019-11-03 LAB — SEDIMENTATION RATE: Sed Rate: 26 mm/hr (ref 0–40)

## 2019-11-03 LAB — HGB A1C W/O EAG: Hgb A1c MFr Bld: 6.3 % — ABNORMAL HIGH (ref 4.8–5.6)

## 2019-11-03 LAB — RPR: RPR Ser Ql: NONREACTIVE

## 2019-11-03 NOTE — Progress Notes (Signed)
Please call pt: Labs are benign with the exception of mildly elevated vit B12, and diabetes marker, called hemoglobin A1c in the pre-diabetes range. I recommend, she follow up for repeat of these tests with her primary care physician in about 3 months.

## 2019-11-06 ENCOUNTER — Telehealth: Payer: Self-pay

## 2019-11-06 NOTE — Telephone Encounter (Signed)
I called pt and discussed her lab results and recommendations. Pt will follow up with Dr. Renne Crigler. Pt verbalized understanding of results. Pt had no questions at this time but was encouraged to call back if questions arise.

## 2019-11-06 NOTE — Telephone Encounter (Signed)
-----   Message from Huston Foley, MD sent at 11/03/2019  1:15 PM EDT ----- Please call pt: Labs are benign with the exception of mildly elevated vit B12, and diabetes marker, called hemoglobin A1c in the pre-diabetes range. I recommend, she follow up for repeat of these tests with her primary care physician in about 3 months.

## 2019-11-09 ENCOUNTER — Encounter (HOSPITAL_COMMUNITY): Payer: Self-pay | Admitting: *Deleted

## 2019-11-09 ENCOUNTER — Emergency Department (HOSPITAL_COMMUNITY): Payer: Medicare Other

## 2019-11-09 ENCOUNTER — Emergency Department (HOSPITAL_COMMUNITY)
Admission: EM | Admit: 2019-11-09 | Discharge: 2019-11-09 | Disposition: A | Discharge status: Home / Self Care | Payer: Medicare Other | Attending: Emergency Medicine | Admitting: Emergency Medicine

## 2019-11-09 DIAGNOSIS — R531 Weakness: Secondary | ICD-10-CM | POA: Diagnosis not present

## 2019-11-09 DIAGNOSIS — I2510 Atherosclerotic heart disease of native coronary artery without angina pectoris: Secondary | ICD-10-CM | POA: Insufficient documentation

## 2019-11-09 DIAGNOSIS — R5381 Other malaise: Secondary | ICD-10-CM | POA: Diagnosis not present

## 2019-11-09 DIAGNOSIS — Z7982 Long term (current) use of aspirin: Secondary | ICD-10-CM | POA: Insufficient documentation

## 2019-11-09 DIAGNOSIS — Z79899 Other long term (current) drug therapy: Secondary | ICD-10-CM | POA: Diagnosis not present

## 2019-11-09 DIAGNOSIS — R69 Illness, unspecified: Secondary | ICD-10-CM | POA: Diagnosis not present

## 2019-11-09 DIAGNOSIS — G459 Transient cerebral ischemic attack, unspecified: Secondary | ICD-10-CM | POA: Insufficient documentation

## 2019-11-09 DIAGNOSIS — R4182 Altered mental status, unspecified: Secondary | ICD-10-CM | POA: Diagnosis not present

## 2019-11-09 LAB — COMPREHENSIVE METABOLIC PANEL
ALT: 54 U/L — ABNORMAL HIGH (ref 0–44)
AST: 28 U/L (ref 15–41)
Albumin: 2.8 g/dL — ABNORMAL LOW (ref 3.5–5.0)
Alkaline Phosphatase: 82 U/L (ref 38–126)
Anion gap: 9 (ref 5–15)
BUN: 16 mg/dL (ref 8–23)
CO2: 29 mmol/L (ref 22–32)
Calcium: 9 mg/dL (ref 8.9–10.3)
Chloride: 95 mmol/L — ABNORMAL LOW (ref 98–111)
Creatinine, Ser: 1.09 mg/dL — ABNORMAL HIGH (ref 0.44–1.00)
GFR calc Af Amer: 51 mL/min — ABNORMAL LOW (ref >60)
GFR calc non Af Amer: 44 mL/min — ABNORMAL LOW (ref >60)
Glucose, Bld: 110 mg/dL — ABNORMAL HIGH (ref 70–99)
Potassium: 3.8 mmol/L (ref 3.5–5.1)
Sodium: 133 mmol/L — ABNORMAL LOW (ref 135–145)
Total Bilirubin: 0.7 mg/dL (ref 0.3–1.2)
Total Protein: 6.4 g/dL — ABNORMAL LOW (ref 6.5–8.1)

## 2019-11-09 LAB — APTT: aPTT: 33 seconds (ref 24–36)

## 2019-11-09 LAB — DIFFERENTIAL
Abs Immature Granulocytes: 0.05 10*3/uL (ref 0.00–0.07)
Basophils Absolute: 0.1 10*3/uL (ref 0.0–0.1)
Basophils Relative: 1 %
Eosinophils Absolute: 0.4 10*3/uL (ref 0.0–0.5)
Eosinophils Relative: 4 %
Immature Granulocytes: 1 %
Lymphocytes Relative: 11 %
Lymphs Abs: 1.1 10*3/uL (ref 0.7–4.0)
Monocytes Absolute: 0.6 10*3/uL (ref 0.1–1.0)
Monocytes Relative: 6 %
Neutro Abs: 7.7 10*3/uL (ref 1.7–7.7)
Neutrophils Relative %: 77 %

## 2019-11-09 LAB — CBC
HCT: 36.3 % (ref 36.0–46.0)
Hemoglobin: 11.4 g/dL — ABNORMAL LOW (ref 12.0–15.0)
MCH: 28.9 pg (ref 26.0–34.0)
MCHC: 31.4 g/dL (ref 30.0–36.0)
MCV: 91.9 fL (ref 80.0–100.0)
Platelets: 299 10*3/uL (ref 150–400)
RBC: 3.95 MIL/uL (ref 3.87–5.11)
RDW: 13.8 % (ref 11.5–15.5)
WBC: 10 10*3/uL (ref 4.0–10.5)
nRBC: 0 % (ref 0.0–0.2)

## 2019-11-09 LAB — ETHANOL: Alcohol, Ethyl (B): <10 mg/dL (ref <10)

## 2019-11-09 LAB — PROTIME-INR
INR: 1.2 (ref 0.8–1.2)
Prothrombin Time: 14.7 seconds (ref 11.4–15.2)

## 2019-11-09 MED ORDER — SODIUM CHLORIDE 0.9 % IV BOLUS
500.0000 mL | Freq: Once | INTRAVENOUS | Status: AC
  Administered 2019-11-09: 500 mL via INTRAVENOUS

## 2019-11-09 NOTE — ED Notes (Signed)
Called patient's friend, Fannie Knee, per patient request, who reports she will send a ride for patient.

## 2019-11-09 NOTE — ED Triage Notes (Signed)
Per EMS, pt from [facility] complains of fatigue today. Denies cough, fever. She says she did not eat or drink much today. No other complaint.   BP 138/88 HR 76 RR 16 Temp 98.1 CBG 133

## 2019-11-09 NOTE — ED Provider Notes (Signed)
Patient inherited at signout from outgoing care team.  As called to the patient's room not long thereafter, due to her request to leave.  We discussed today's presentation, concern for TIA versus stroke, need for additional studies. Patient acknowledges the importance of these, but states that she is not interested in waiting to have them performed today. She acknowledges the risk of not doing this today, but states that she would like to follow-up with her physician at her nursing facility instead. Per request, patient discharged.   Gerhard Munch, MD 11/09/19 [phone]

## 2019-11-09 NOTE — Discharge Instructions (Signed)
As discussed, although you have elected to leave and pursue additional medical evaluation with your physician at your facility, do not hesitate to return here if you develop new, or concerning changes in your condition.

## 2019-11-09 NOTE — ED Provider Notes (Signed)
[facility]-EMERGENCY DEPT Provider Note   CSN: [id number] Arrival date & time: 11/09/19  1315     History Chief Complaint  Patient presents with  . Fatigue  . Weakness    right arm    [name] is a 86 y.o. female.  HPI 86 year old female presents with weakness.  History is taken from the nurse who spoke to EMS as well as the patient.  Patient states that earlier today she was feeling acutely weak.  At first it seemed to be generalized weakness but when asked the patient specifically she tells me that her right arm was weak.  Lasted about an hour.  Felt like it was asleep.  She could still move it but it was definitely weaker.  No headache, facial symptoms, speech changes, or leg pain/weakness.  Complete resolution of symptoms.  Nursing home had checked her vital signs right after this episode and were getting abnormal reading such as heart rate of 133 and pulse ox of 80%.  However EMS did not get these numbers and noted normal heart rate and oxygenation.   Past Medical History:  Diagnosis Date  . Anemia   . Carotid arterial disease (HCC)    asymptomatic   . Chest tightness   . Coronary artery disease   . High blood pressure   . Leg pain    with walking  . Palpitations     Patient Active Problem List   Diagnosis Date Noted  . Palpitations   . Chest tightness   . Coronary artery disease   . Carotid arterial disease (HCC)   . Renovascular hypertension 04/17/2016  . Essential hypertension vs Renovascular hbp 12/26/2014  . Postinflammatory pulmonary fibrosis (HCC) 12/25/2014  . Tachycardia 12/13/2014  . Aftercare following surgery of the circulatory system, NEC 11/28/2013  . CAD (coronary artery disease) 09/21/2013  . Right renal artery stenosis (HCC) 09/21/2013  . Hyperlipidemia 09/21/2013  . First degree AV block 09/21/2013  . Carotid stenosis 11/24/2011    Past Surgical History:  Procedure Laterality Date  . CARDIAC CATHETERIZATION   07/15/04   noncritical CAD,80% PLA treated medically. EF% 60. no RAS.  [name] CAROTID ENDARTERECTOMY     RIGHT=05/11/11, LEFT=03/30/11  . EYE SURGERY  2011   Cataract surgery Bilateral  . EYE SURGERY  2011   Bilateral eyelid surgery for ptosis  . renal artery duplex  02/23/13   ABDOMINAL AORTA:<50% DIAMETER REDUCTION. RIGHT RENAL ARTERY: 60-99% DIAMETER REDUCTION.[name] LEFT RENAL ARTERY:1-59% DIAMETER REDUCTION.     OB History   No obstetric history on file.     Family History  Problem Relation Age of Onset  . Lung disease Mother   . Heart disease Father   . Cancer Sister        liver cancer  . Heart disease Brother     Social History   Tobacco Use  . Smoking status: Never Smoker  . Smokeless tobacco: Never Used  Substance Use Topics  . Alcohol use: No  . Drug use: No    Home Medications Prior to Admission medications   Medication Sig Start Date End Date Taking? Authorizing Provider  aspirin EC 81 MG tablet Take 1 tablet (81 mg total) by mouth daily. 04/18/15   Croitoru, Mihai, MD  famotidine (PEPCID) 20 MG tablet TAKE 1 TABLET BY MOUTH EVERYDAY AT BEDTIME 11/15/17   Nyoka Cowden, MD  Magnesium 500 MG TABS Take 500 mg by mouth daily.    [provider]  metoprolol (  LOPRESSOR) 100 MG tablet Take 100 mg by mouth 2 (two) times daily.  03/07/14   [provider]  pantoprazole (PROTONIX) 40 MG tablet TAKE 1 TABLET (40 MG TOTAL) BY MOUTH DAILY. TAKE 30-60 MIN BEFORE FIRST MEAL OF THE DAY 11/09/17   Nyoka Cowden, MD  rOPINIRole (REQUIP) 1 MG tablet Take 1-2 mg by mouth every evening.     [provider]  rosuvastatin (CRESTOR) 10 MG tablet Take 10 mg by mouth at bedtime.     [provider]  valsartan-hydrochlorothiazide (DIOVAN-HCT) 160-25 MG tablet Take 1 tablet by mouth daily.    [provider]    Allergies    Cardura [doxazosin mesylate], Lotensin [benazepril hcl], and Tramadol  Review of Systems   Review of Systems  Eyes:  Negative for visual disturbance.  Neurological: Positive for weakness. Negative for numbness and headaches.  All other systems reviewed and are negative.   Physical Exam Updated Vital Signs BP (!) 106/56   Pulse 65   Temp 97.7 F (36.5 C) (Oral)   Resp (!) 22   SpO2 92%   Physical Exam Vitals and nursing note reviewed.  Constitutional:      General: She is not in acute distress.    Appearance: She is well-developed. She is not ill-appearing or diaphoretic.  HENT:     Head: Normocephalic and atraumatic.     Right Ear: External ear normal.     Left Ear: External ear normal.     Nose: Nose normal.  Eyes:     General:        Right eye: No discharge.        Left eye: No discharge.  Cardiovascular:     Rate and Rhythm: Normal rate and regular rhythm.     Pulses:          Radial pulses are 2+ on the right side and 2+ on the left side.     Heart sounds: Normal heart sounds.  Pulmonary:     Effort: Pulmonary effort is normal.     Breath sounds: Normal breath sounds.  Abdominal:     Palpations: Abdomen is soft.     Tenderness: There is no abdominal tenderness.  Skin:    General: Skin is warm and dry.  Neurological:     Mental Status: She is alert.     Comments: Alert, oriented to person, place, year, disoriented to month (says feb).  CN 3-12 grossly intact. 5/5 strength in all 4 extremities. Grossly normal sensation. Normal finger to nose.   Psychiatric:        Mood and Affect: Mood is not anxious.     ED Results / Procedures / Treatments   Labs (all labs ordered are listed, but only abnormal results are displayed) Labs Reviewed  CBC - Abnormal; Notable for the following components:      Result Value   Hemoglobin 11.4 (*)    All other components within normal limits  PROTIME-INR  APTT  DIFFERENTIAL  ETHANOL  COMPREHENSIVE METABOLIC PANEL  RAPID URINE DRUG SCREEN, HOSP PERFORMED  URINALYSIS, ROUTINE W REFLEX MICROSCOPIC    EKG None ED ECG REPORT   Date:  11/09/2019  Rate: 68  Rhythm: normal sinus rhythm  QRS Axis: normal  Intervals: PR prolonged  ST/T Wave abnormalities: nonspecific T wave changes  Conduction Disutrbances:none  Narrative Interpretation:   Old EKG Reviewed: changes noted  I have personally reviewed the EKG tracing and agree with the computerized printout as noted.  Radiology No results found.  Procedures Procedures (including critical care time)  Medications Ordered in ED Medications  sodium chloride 0.9 % bolus 500 mL (0 mLs Intravenous Stopped 11/09/19 1513)    ED Course  I have reviewed the triage vital signs and the nursing notes.  Pertinent labs & imaging results that were available during my care of the patient were reviewed by me and considered in my medical decision making (see chart for details).    MDM Rules/Calculators/A&P                      Exam is unremarkable now.  Recent chart review indicates she probably has some mild dementia but she seems pretty aware of what happened today.  Plan is for TIA work-up.  She is hesitant about possibly coming in the hospital, will do work-up in the ED and then reevaluate.  Care to Dr. Vanita Panda. Final Clinical Impression(s) / ED Diagnoses Final diagnoses:  TIA (transient ischemic attack)    Rx / DC Orders ED Discharge Orders    None       Sherwood Gambler, MD 11/09/19 1521

## 2019-11-22 ENCOUNTER — Non-Acute Institutional Stay: Payer: Medicare Other | Admitting: Internal Medicine

## 2019-11-22 ENCOUNTER — Encounter: Payer: Self-pay | Admitting: Internal Medicine

## 2019-11-22 ENCOUNTER — Other Ambulatory Visit: Payer: Self-pay

## 2019-11-22 VITALS — BP 130/70 | HR 81 | Wt 100.0 lb

## 2019-11-22 DIAGNOSIS — I10 Essential (primary) hypertension: Secondary | ICD-10-CM

## 2019-11-22 DIAGNOSIS — I2510 Atherosclerotic heart disease of native coronary artery without angina pectoris: Secondary | ICD-10-CM

## 2019-11-22 DIAGNOSIS — E782 Mixed hyperlipidemia: Secondary | ICD-10-CM | POA: Diagnosis not present

## 2019-11-22 DIAGNOSIS — I150 Renovascular hypertension: Secondary | ICD-10-CM

## 2019-11-22 DIAGNOSIS — G2581 Restless legs syndrome: Secondary | ICD-10-CM | POA: Diagnosis not present

## 2019-11-22 DIAGNOSIS — F0390 Unspecified dementia without behavioral disturbance: Secondary | ICD-10-CM | POA: Diagnosis not present

## 2019-11-22 DIAGNOSIS — M810 Age-related osteoporosis without current pathological fracture: Secondary | ICD-10-CM

## 2019-11-22 NOTE — Progress Notes (Signed)
Location:  [facility]:  Clinic (12)  Provider:   Code Status:  Goals of Care:  Advanced Directives 04/27/2017  Does Patient Have a Medical Advance Directive? Yes  Type of Estate agent of [place];Living will  Copy of Healthcare Power of Attorney in Chart? -  Would patient like information on creating a medical advance directive? -     Chief Complaint  Patient presents with  . Medical Management of Chronic Issues    Dementia concerns. Patient states she is been seeing today to establish care. Patient was unsure of her birthdate.     HPI: Patient is a 86 y.o. female seen today for medical management of chronic diseases.    Patient has h/o Hypertension, Hyperlipidemia, CAD, MVP, Restless Leg, Hiatal Hernia, Osteoporosis and Arthritis  [place] Her 2 sons live out of state She is here to establish care. Per Facility Nurse and Speech therapy. She has been having memory issues. Her Neighbors and friends have to help her a lot. She gets lost in the facility also. Forgets to order Meals. Has lost weight They think she is not safe to live [place] herself [place] herself  Patient thinks she is doing well and can continue living by herself. She has 2 son who live out of state.  Walks with walker. No Falls recently. Independent in her ADLS She says she does her IADLS herself. I am not sure about it. She did not have any complains today. Did c/oThat she forget sometimes.    Past Medical History:  Diagnosis Date  . Anemia   . Carotid arterial disease (HCC)    asymptomatic   . Chest tightness   . Coronary artery disease   . High blood pressure   . Leg pain    with walking  . Palpitations     Past Surgical History:  Procedure Laterality Date  . CARDIAC CATHETERIZATION  07/15/04   noncritical CAD,80% PLA treated medically. EF% 60. no RAS.  [name] CAROTID ENDARTERECTOMY     RIGHT=05/11/11,  LEFT=03/30/11  . EYE SURGERY  2011   Cataract surgery Bilateral  . EYE SURGERY  2011   Bilateral eyelid surgery for ptosis  . renal artery duplex  02/23/13   ABDOMINAL AORTA:<50% DIAMETER REDUCTION. RIGHT RENAL ARTERY: 60-99% DIAMETER REDUCTION.[name] LEFT RENAL ARTERY:1-59% DIAMETER REDUCTION.    Allergies  Allergen Reactions  . Cardura [Doxazosin Mesylate]     Reaction not recalled by the patient  . Lotensin [Benazepril Hcl]     Reaction not recalled by the patient  . Tramadol Nausea And Vomiting    Outpatient Encounter Medications as of 11/22/2019  Medication Sig  . aspirin EC 81 MG tablet Take 1 tablet (81 mg total) by mouth daily.  . famotidine (PEPCID) 20 MG tablet TAKE 1 TABLET BY MOUTH EVERYDAY AT BEDTIME  . metoprolol (LOPRESSOR) 100 MG tablet Take 100 mg by mouth 2 (two) times daily.   . pantoprazole (PROTONIX) 40 MG tablet TAKE 1 TABLET (40 MG TOTAL) BY MOUTH DAILY. TAKE 30-60 MIN BEFORE FIRST MEAL OF THE DAY  . rOPINIRole (REQUIP) 1 MG tablet Take 1-2 mg by mouth every evening.   . rosuvastatin (CRESTOR) 10 MG tablet Take 10 mg by mouth at bedtime.   . valsartan-hydrochlorothiazide (DIOVAN-HCT) 160-25 MG tablet Take 1 tablet by mouth daily.  . [DISCONTINUED] Magnesium 500 MG TABS Take 500 mg by mouth daily.  No facility-administered encounter medications on file as of 11/22/2019.    Review of Systems:  Review of Systems  Review of Systems  Constitutional: Negative for activity change, appetite change, chills, diaphoresis, fatigue and fever.  HENT: Negative for mouth sores, postnasal drip, rhinorrhea, sinus pain and sore throat.   Respiratory: Negative for apnea, cough, chest tightness, shortness of breath and wheezing.   Cardiovascular: Negative for chest pain, palpitations and leg swelling.  Gastrointestinal: Negative for abdominal distention, abdominal pain, constipation, diarrhea, nausea and vomiting.  Genitourinary: Negative for dysuria and frequency.  Musculoskeletal:  Negative for arthralgias, joint swelling and myalgias.  Skin: Negative for rash.  Neurological: Negative for dizziness, syncope, weakness, light-headedness and numbness.  Psychiatric/Behavioral: Negative for behavioral problems, confusion and sleep disturbance.     Health Maintenance  Topic Date Due  . DEXA SCAN  Never done  . PNA vac Low Risk Adult (2 of 2 - PPSV23) 08/24/2015  . INFLUENZA VACCINE  02/18/2020  . TETANUS/TDAP  09/22/2027  . COVID-19 Vaccine  Completed    Physical Exam: Vitals:   11/22/19 1314  Weight: 100 lb (45.4 kg)   Body mass index is 18.29 kg/m. Physical Exam  Constitutional: . Well-developed and well-nourished.  HENT:  Head: Normocephalic.  Ears No Wax  Mouth/Throat: Oropharynx is clear and moist.  Eyes: Pupils are equal, round, and reactive to light.  Neck: Neck supple.  Cardiovascular: Normal rate and normal heart sounds.  No murmur heard. Pulmonary/Chest: Effort normal and breath sounds normal. No respiratory distress. No wheezes. She has no rales.  Abdominal: Soft. Bowel sounds are normal. No distension. There is no tenderness. There is no rebound.  Musculoskeletal: No edema.  Lymphadenopathy: none Neurological: No Focal Deficits Gait was stable Walks with walker MMSE 18/30 Skin: Skin is warm and dry.  Psychiatric: Normal mood and affect. Behavior is normal. Thought content normal.    Labs reviewed: Basic Metabolic Panel: Recent Labs    11/02/19 1122 11/09/19 1441  NA  --  133*  K  --  3.8  CL  --  95*  CO2  --  29  GLUCOSE  --  110*  BUN  --  16  CREATININE  --  1.09*  CALCIUM  --  9.0  TSH 2.130  --    Liver Function Tests: Recent Labs    11/09/19 1441  AST 28  ALT 54*  ALKPHOS 82  BILITOT 0.7  PROT 6.4*  ALBUMIN 2.8*   No results for input(s): LIPASE, AMYLASE in the last 8760 hours. No results for input(s): AMMONIA in the last 8760 hours. CBC: Recent Labs    11/09/19 1441  WBC 10.0  NEUTROABS 7.7  HGB 11.4*    HCT 36.3  MCV 91.9  PLT 299   Lipid Panel: No results for input(s): CHOL, HDL, LDLCALC, TRIG, CHOLHDL, LDLDIRECT in the last 8760 hours. Lab Results  Component Value Date   HGBA1C 6.3 (H) 11/02/2019    Procedures since last visit: CT HEAD WO CONTRAST  Result Date: 11/09/2019 CLINICAL DATA:  Per EMS, [facility] complains of fatigue today. Denies cough, fever. She says she did not eat or drink much today. No other complaint. EXAM: CT HEAD WITHOUT CONTRAST TECHNIQUE: Contiguous axial images were obtained from the base of the skull through the vertex without intravenous contrast. COMPARISON:  09/29/2017. FINDINGS: Brain: No evidence of acute infarction, hemorrhage, hydrocephalus, extra-axial collection or mass lesion/mass effect. There is age appropriate ventricular and sulcal enlargement. Vascular: No  hyperdense vessel or unexpected calcification. Skull: Normal. Negative for fracture or focal lesion. Sinuses/Orbits: Globes and orbits are unremarkable. The visualized sinuses are clear. Other: None. IMPRESSION: 1. No acute intracranial abnormalities. Electronically Signed   By: Lajean Manes M.D.   On: 11/09/2019 15:23    Assessment/Plan  Essential hypertension vs Renovascular hbp Controlled on Diovan and metoprolol  Coronary artery disease i On Aspirin, Beta Blocker and Crestor   Mixed hyperlipidemia Continue Crestor  Restless leg syndrome Symptoms controlled on Requip  Age-related osteoporosis without current pathological fracture It seems she got Prolia shot in 05/2019 Will Need follow up  Dementia Seemed Mixed Vascular and Alzheimer She got 18/30 on her MMSE Missed recall 0/3 and 0/5 in her Number counting Also did not know the year season month or date Her clock drawing  Was good. D/W her in details about getting MRI She said she will think about it. Will see her for follow up after that for Namenda I would recommend AL for Medication Management and  Supportive care Nurse is going to talk to her Sons  Labs/tests ordered:  * No order type specified * Next appt:  Visit date not found   Total time spent in this patient care encounter was  45_  minutes; greater than 50% of the visit spent counseling patient and staff, reviewing records , Labs and coordinating care for problems addressed at this encounter.

## 2019-11-30 ENCOUNTER — Ambulatory Visit
Admission: RE | Admit: 2019-11-30 | Discharge: 2019-11-30 | Disposition: A | Discharge status: Home / Self Care | Payer: Medicare Other | Source: Ambulatory Visit | Attending: Neurology | Admitting: Neurology

## 2019-11-30 ENCOUNTER — Other Ambulatory Visit: Payer: Self-pay

## 2019-11-30 DIAGNOSIS — R413 Other amnesia: Secondary | ICD-10-CM

## 2019-11-30 DIAGNOSIS — F0390 Unspecified dementia without behavioral disturbance: Secondary | ICD-10-CM

## 2019-12-04 NOTE — Progress Notes (Signed)
Please call patient regarding the recent brain MRI: The brain scan showed a normal structure of the brain and moderate volume loss which we call atrophy. There were changes in the deeper structures of the brain, which we call white matter changes or microvascular changes. These were reported as mild in Her case. These are tiny white spots, that occur with time and are seen in a variety of conditions, including with normal aging, chronic hypertension, chronic headaches, especially migraine HAs, chronic diabetes, chronic hyperlipidemia. These are not strokes and no mass was seen, which is reassuring. Again, there were no acute findings, such as a stroke, or mass or blood products. No further action is required on this test at this time, other than re-enforcing the importance of good blood pressure control, good cholesterol control, good blood sugar control, and weight management. Please remind patient to keep any upcoming appointments or tests and to call us with any interim questions, concerns, problems or updates. Thanks,  Huston Foley, MD, PhD

## 2019-12-14 ENCOUNTER — Non-Acute Institutional Stay: Payer: Medicare Other | Admitting: Internal Medicine

## 2019-12-14 ENCOUNTER — Encounter: Payer: Self-pay | Admitting: Internal Medicine

## 2019-12-14 DIAGNOSIS — F0390 Unspecified dementia without behavioral disturbance: Secondary | ICD-10-CM

## 2019-12-14 DIAGNOSIS — G2581 Restless legs syndrome: Secondary | ICD-10-CM | POA: Diagnosis not present

## 2019-12-14 DIAGNOSIS — I2510 Atherosclerotic heart disease of native coronary artery without angina pectoris: Secondary | ICD-10-CM

## 2019-12-14 DIAGNOSIS — I10 Essential (primary) hypertension: Secondary | ICD-10-CM | POA: Diagnosis not present

## 2019-12-14 DIAGNOSIS — E782 Mixed hyperlipidemia: Secondary | ICD-10-CM | POA: Diagnosis not present

## 2019-12-14 DIAGNOSIS — M810 Age-related osteoporosis without current pathological fracture: Secondary | ICD-10-CM | POA: Diagnosis not present

## 2019-12-14 NOTE — Progress Notes (Signed)
Provider:  Veleta Miners MD Location:    [facility] Room Number: 15 Place of Service:  ALF ([phone])  PCP: Virgie Dad, MD Patient Care Team: Virgie Dad, MD as PCP - General (Internal Medicine) Terance Ice, MD (Inactive) (Cardiology)  Extended Emergency Contact Information Primary Emergency Contact: Jones Bales of [place] Phone: [phone] Relation: Friend Secondary Emergency Contact: Rondall Allegra States of [place] Phone: [phone] Relation: Friend  Code Status: Full Code Goals of Care: Advanced Directive information Advanced Directives 12/14/2019  Does Patient Have a Medical Advance Directive? Yes  Type of Advance Directive Living will  Copy of [place] in Chart? -  Would patient like information on creating a medical advance directive? -      Chief Complaint  Patient presents with  . New Admit To SNF    Admission to AL    HPI: Patient is a 86 y.o. female seen today for admission to Al  Patient has h/o Hypertension, Hyperlipidemia, CAD, MVP, Restless Leg, Hiatal Hernia, Osteoporosis and Arthritis  Patient used to live by herself in [place].  Her 2 sons live out of state.  But she was having issues with her memory.  With forgetting to order meals.  Forgetting to take her medications.  She was evaluated by speech and the facility nurse and was decided that it was not safe for her to live by herself.  Now she has moved to [place]. For her cognitive impairment she was seen by Dr. Rexene Alberts.  She had an MRI done which showed Moderate cortical atrophy most progressed in temporal and parietal lobes with mild age-related chronic ischemic changes. Patient did not have any acute complaints today.  She is walking with her walker.  She was at still upset because of the new place and how she misses being in her apartment.   Past Medical History:  Diagnosis Date  . Anemia   . Carotid arterial disease  (HCC)    asymptomatic   . Chest tightness   . Coronary artery disease   . High blood pressure   . Leg pain    with walking  . Palpitations    Past Surgical History:  Procedure Laterality Date  . CARDIAC CATHETERIZATION  07/15/04   noncritical CAD,80% PLA treated medically. EF% 60. no RAS.  [name] CAROTID ENDARTERECTOMY     RIGHT=05/11/11, LEFT=03/30/11  . EYE SURGERY  2011   Cataract surgery Bilateral  . EYE SURGERY  2011   Bilateral eyelid surgery for ptosis  . renal artery duplex  02/23/13   ABDOMINAL AORTA:<50% DIAMETER REDUCTION. RIGHT RENAL ARTERY: 60-99% DIAMETER REDUCTION.[name] LEFT RENAL ARTERY:1-59% DIAMETER REDUCTION.    reports that she has never smoked. She has never used smokeless tobacco. She reports that she does not drink alcohol or use drugs. Social History   Socioeconomic History  . Marital status: Widowed    Spouse name: Not on file  . Number of children: Not on file  . Years of education: Not on file  . Highest education level: Not on file  Occupational History  . Not on file  Tobacco Use  . Smoking status: Never Smoker  . Smokeless tobacco: Never Used  Substance and Sexual Activity  . Alcohol use: No  . Drug use: No  . Sexual activity: Not on file  Other Topics Concern  . Not on file  Social History Narrative  . Not on file   Social Determinants of Health  Financial Resource Strain:   . Difficulty of Paying Living Expenses:   Food Insecurity:   . Worried About Programme researcher, broadcasting/film/video in the Last Year:   . Barista in the Last Year:   Transportation Needs:   . Freight forwarder (Medical):   [name] Lack of Transportation (Non-Medical):   Physical Activity:   . Days of Exercise per Week:   . Minutes of Exercise per Session:   Stress:   . Feeling of Stress :   Social Connections:   . Frequency of Communication with Friends and Family:   . Frequency of Social Gatherings with Friends and Family:   . Attends Religious Services:   . Active Member  of Clubs or Organizations:   . Attends Banker Meetings:   [name] Marital Status:   Intimate Partner Violence:   . Fear of Current or Ex-Partner:   . Emotionally Abused:   [name] Physically Abused:   . Sexually Abused:     Functional Status Survey:    Family History  Problem Relation Age of Onset  . Lung disease Mother   . Heart disease Father   . Cancer Sister        liver cancer  . Heart disease Brother     Health Maintenance  Topic Date Due  . DEXA SCAN  Never done  . PNA vac Low Risk Adult (2 of 2 - PPSV23) 08/24/2015  . INFLUENZA VACCINE  02/18/2020  . TETANUS/TDAP  09/22/2027  . COVID-19 Vaccine  Completed    Allergies  Allergen Reactions  . Cardura [Doxazosin Mesylate]     Reaction not recalled by the patient  . Lotensin [Benazepril Hcl]     Reaction not recalled by the patient  . Tramadol Nausea And Vomiting    Allergies as of 12/14/2019      Reactions   Cardura [doxazosin Mesylate]    Reaction not recalled by the patient   Lotensin [benazepril Hcl]    Reaction not recalled by the patient   Tramadol Nausea And Vomiting      Medication List       Accurate as of Dec 14, 2019 11:59 PM. If you have any questions, ask your nurse or doctor.        aspirin EC 81 MG tablet Take 1 tablet (81 mg total) by mouth daily.   famotidine 20 MG tablet Commonly known as: PEPCID TAKE 1 TABLET BY MOUTH EVERYDAY AT BEDTIME   metoprolol tartrate 100 MG tablet Commonly known as: LOPRESSOR Take 100 mg by mouth 2 (two) times daily.   pantoprazole 40 MG tablet Commonly known as: PROTONIX TAKE 1 TABLET (40 MG TOTAL) BY MOUTH DAILY. TAKE 30-60 MIN BEFORE FIRST MEAL OF THE DAY   rOPINIRole 1 MG tablet Commonly known as: REQUIP Take 1-2 mg by mouth every evening.   rosuvastatin 10 MG tablet Commonly known as: CRESTOR Take 10 mg by mouth at bedtime.   valsartan-hydrochlorothiazide 160-25 MG tablet Commonly known as: DIOVAN-HCT Take 1 tablet by mouth daily.        Review of Systems  Review of Systems  Constitutional: Negative for activity change, appetite change, chills, diaphoresis, fatigue and fever.  HENT: Negative for mouth sores, postnasal drip, rhinorrhea, sinus pain and sore throat.   Respiratory: Negative for apnea, cough, chest tightness, shortness of breath and wheezing.   Cardiovascular: Negative for chest pain, palpitations and leg swelling.  Gastrointestinal: Negative for abdominal distention, abdominal pain, constipation, diarrhea, nausea and  vomiting.  Genitourinary: Negative for dysuria and frequency.  Musculoskeletal: Negative for arthralgias, joint swelling and myalgias.  Skin: Negative for rash.  Neurological: Negative for dizziness, syncope, weakness, light-headedness and numbness.  Psychiatric/Behavioral: Negative for behavioral problems, confusion and sleep disturbance.     Vitals:   12/14/19 1023  BP: 140/70  Pulse: 60  Resp: 18  Temp: (!) 97.2 F (36.2 C)  SpO2: 96%  Weight: 98 lb 3.2 oz (44.5 kg)  Height: 5\' 5"  (1.651 m)   Body mass index is 16.34 kg/m. Physical Exam  Constitutional:  Well-developed and well-nourished.  HENT:  Head: Normocephalic.  Mouth/Throat: Oropharynx is clear and moist.  Eyes: Pupils are equal, round, and reactive to light.  Neck: Neck supple.  Cardiovascular: Normal rate and normal heart sounds.  No murmur heard. Pulmonary/Chest: Effort normal and breath sounds normal. No respiratory distress. No wheezes. She has no rales.  Abdominal: Soft. Bowel sounds are normal. No distension. There is no tenderness. There is no rebound.  Musculoskeletal: No edema.  Lymphadenopathy: none Neurological: No Focal Deficits Walks with the walker MMSE 18/30  Skin: Skin is warm and dry.  Psychiatric: Normal mood and affect. Behavior is normal. Thought content normal.    Labs reviewed: Basic Metabolic Panel: Recent Labs    11/09/19 1441  NA 133*  K 3.8  CL 95*  CO2 29  GLUCOSE 110*   BUN 16  CREATININE 1.09*  CALCIUM 9.0   Liver Function Tests: Recent Labs    11/09/19 1441  AST 28  ALT 54*  ALKPHOS 82  BILITOT 0.7  PROT 6.4*  ALBUMIN 2.8*   No results for input(s): LIPASE, AMYLASE in the last 8760 hours. No results for input(s): AMMONIA in the last 8760 hours. CBC: Recent Labs    11/09/19 1441  WBC 10.0  NEUTROABS 7.7  HGB 11.4*  HCT 36.3  MCV 91.9  PLT 299   Cardiac Enzymes: No results for input(s): CKTOTAL, CKMB, CKMBINDEX, TROPONINI in the last 8760 hours. BNP: Invalid input(s): POCBNP Lab Results  Component Value Date   HGBA1C 6.3 (H) 11/02/2019   Lab Results  Component Value Date   TSH 2.130 11/02/2019   Lab Results  Component Value Date   VITAMINB12 1,386 (H) 11/02/2019   Lab Results  Component Value Date   FOLATE 11.5 11/02/2019   No results found for: IRON, TIBC, FERRITIN  Imaging and Procedures obtained prior to SNF admission: MR BRAIN WO CONTRAST  Result Date: 12/01/2019  [facility] [address] [place], [place] [phone] NEUROIMAGING REPORT STUDY DATE: 11/30/2019 PATIENT NAME: [name] DOB: [birth year] MRN: [id number] EXAM: MRI Brain without contrast ORDERING CLINICIAN: [id number], MD, PhD CLINICAL HISTORY: 86 year old woman with memory loss and dementia COMPARISON FILMS: CT 11/09/2019 TECHNIQUE: MRI of the brain without contrast was obtained utilizing 5 mm axial slices with T1, T2, T2 flair, SWI and diffusion weighted views.  T1 sagittal and T2 coronal views were obtained. CONTRAST: none IMAGING SITE: [facility], [address] [place], [place] FINDINGS: On sagittal images, the spinal cord is imaged caudally to C4 and is normal in caliber.   The contents of the posterior fossa are of normal size and position.   The pituitary gland and optic chiasm appear normal.    There is moderate generalized cortical atrophy, most progressed in the medial temporal lobes and parietal lobes.   There are no abnormal extra-axial collections of fluid.  There are some scattered T2/FLAIR hyperintense foci in the  pons hemispheres.  None of the foci appear to be acute.  The cerebellum appears normal.   The deep gray matter appears normal.  Diffusion weighted images are normal.  Susceptibility weighted images are normal.  There are bilateral lens replacements.  Otherwise, the orbits appear normal.   The VIIth/VIIIth nerve complex appears normal.  The mastoid air cells appear normal.  There is mucoperiosteal thickening in the right maxillary sinus.  The other paranasal sinuses appear normal.  Flow voids are identified within the major intracerebral arteries.   No changes compared to the CT scan dated 11/09/2019.   This MRI of the brain without contrast shows the following: 1.  Moderate generalized cortical atrophy, most progressed in the medial temporal lobes and parietal lobes. 2.  Some scattered T2/FLAIR hyperintense foci consistent with mild age-related chronic microvascular ischemic changes. 3.  Chronic right maxillary sinusitis. 4.  There are no acute findings. INTERPRETING PHYSICIAN: Richard A. Epimenio Foot, MD, PhD, FAAN Certified in  Neuroimaging by [employer]    Assessment/Plan  Essential hypertension vs Renovascular hbp It has been mildly elevated since she has been here We will continue to monitor On Diovan and metoprolol  CAD Continue on aspirin, beta-blocker and Crestor Mixed hyperlipidemia Continue on Crestor  Will need repeat lipids checked Restless leg syndrome Symptoms seem controlled on Requip Age-related osteoporosis without current pathological fracture Patient is on Prolia shot and per record her last shot was on 1120 She should be due for another shot Dementia without behavioral disturbance, unspecified dementia type [facility] Fall) She got 18/30 on her MMSE Missed recall 0/3 and 0/5 in her Number counting Also did not know the year season month or date Her clock  drawing  Was good. Does look like mixed dementia We will start her on Namenda MRI showed atrophy with chronic vessel changes Neurology suggested blood pressure control and lipid control   Family/ staff Communication:   Labs/tests ordered: CBC,CMP,Lipid and Vit D level  Total time spent in this patient care encounter was  45_  minutes; greater than 50% of the visit spent counseling patient and staff, reviewing records , Labs and coordinating care for problems addressed at this encounter.

## 2019-12-18 ENCOUNTER — Encounter: Payer: Self-pay | Admitting: Internal Medicine

## 2019-12-19 DIAGNOSIS — M545 Low back pain: Secondary | ICD-10-CM | POA: Diagnosis not present

## 2019-12-19 DIAGNOSIS — M25561 Pain in right knee: Secondary | ICD-10-CM | POA: Diagnosis not present

## 2019-12-19 DIAGNOSIS — R2681 Unsteadiness on feet: Secondary | ICD-10-CM | POA: Diagnosis not present

## 2019-12-19 DIAGNOSIS — F0390 Unspecified dementia without behavioral disturbance: Secondary | ICD-10-CM | POA: Diagnosis not present

## 2019-12-19 DIAGNOSIS — R41841 Cognitive communication deficit: Secondary | ICD-10-CM | POA: Diagnosis not present

## 2019-12-20 ENCOUNTER — Encounter: Payer: Medicare Other | Admitting: Internal Medicine

## 2019-12-20 ENCOUNTER — Other Ambulatory Visit: Payer: Self-pay

## 2019-12-20 ENCOUNTER — Telehealth: Payer: Self-pay

## 2019-12-20 DIAGNOSIS — F0390 Unspecified dementia without behavioral disturbance: Secondary | ICD-10-CM | POA: Diagnosis not present

## 2019-12-20 DIAGNOSIS — R2681 Unsteadiness on feet: Secondary | ICD-10-CM | POA: Diagnosis not present

## 2019-12-20 DIAGNOSIS — R41841 Cognitive communication deficit: Secondary | ICD-10-CM | POA: Diagnosis not present

## 2019-12-20 DIAGNOSIS — M545 Low back pain: Secondary | ICD-10-CM | POA: Diagnosis not present

## 2019-12-20 DIAGNOSIS — M25561 Pain in right knee: Secondary | ICD-10-CM | POA: Diagnosis not present

## 2019-12-20 NOTE — Telephone Encounter (Signed)
Left voice mail for pts friend Fannie Knee on Glass blower/designer. PT put down [facility] on dpr.

## 2019-12-20 NOTE — Telephone Encounter (Signed)
-----   Message from Hildred Alamin, RN sent at 12/04/2019  4:56 PM EDT -----  ----- Message ----- From: Huston Foley, MD Sent: 12/04/2019   8:11 AM EDT To: Sunday Spillers, RN  Please call patient regarding the recent brain MRI: The brain scan showed a normal structure of the brain and moderate volume loss which we call atrophy. There were changes in the deeper structures of the brain, which we call white matter changes or microvascular changes. These were reported as mild in Her case. These are tiny white spots, that occur with time and are seen in a variety of conditions, including with normal aging, chronic hypertension, chronic headaches, especially migraine HAs, chronic diabetes, chronic hyperlipidemia. These are not strokes and no mass was seen, which is reassuring. Again, there were no acute findings, such as a stroke, or mass or blood products. No further action is required on this test at this time, other than re-enforcing the importance of good blood pressure control, good cholesterol control, good blood sugar control, and weight management. Please remind patient to keep any upcoming appointments or tests and to call us with any interim questions, concerns, problems or updates. Thanks,  Huston Foley, MD, PhD

## 2019-12-20 NOTE — Telephone Encounter (Signed)
I tried to call pts listed number, but it stated call cannot be completed at this time

## 2019-12-21 DIAGNOSIS — R2681 Unsteadiness on feet: Secondary | ICD-10-CM | POA: Diagnosis not present

## 2019-12-21 DIAGNOSIS — M545 Low back pain: Secondary | ICD-10-CM | POA: Diagnosis not present

## 2019-12-21 DIAGNOSIS — R41841 Cognitive communication deficit: Secondary | ICD-10-CM | POA: Diagnosis not present

## 2019-12-21 DIAGNOSIS — F0390 Unspecified dementia without behavioral disturbance: Secondary | ICD-10-CM | POA: Diagnosis not present

## 2019-12-21 DIAGNOSIS — M25561 Pain in right knee: Secondary | ICD-10-CM | POA: Diagnosis not present

## 2019-12-22 ENCOUNTER — Encounter: Payer: Self-pay | Admitting: Internal Medicine

## 2019-12-22 DIAGNOSIS — F0390 Unspecified dementia without behavioral disturbance: Secondary | ICD-10-CM | POA: Diagnosis not present

## 2019-12-22 DIAGNOSIS — R2681 Unsteadiness on feet: Secondary | ICD-10-CM | POA: Diagnosis not present

## 2019-12-22 DIAGNOSIS — M25561 Pain in right knee: Secondary | ICD-10-CM | POA: Diagnosis not present

## 2019-12-22 DIAGNOSIS — M545 Low back pain: Secondary | ICD-10-CM | POA: Diagnosis not present

## 2019-12-22 DIAGNOSIS — R41841 Cognitive communication deficit: Secondary | ICD-10-CM | POA: Diagnosis not present

## 2019-12-25 DIAGNOSIS — M25561 Pain in right knee: Secondary | ICD-10-CM | POA: Diagnosis not present

## 2019-12-25 DIAGNOSIS — M545 Low back pain: Secondary | ICD-10-CM | POA: Diagnosis not present

## 2019-12-25 DIAGNOSIS — F0390 Unspecified dementia without behavioral disturbance: Secondary | ICD-10-CM | POA: Diagnosis not present

## 2019-12-25 DIAGNOSIS — R2681 Unsteadiness on feet: Secondary | ICD-10-CM | POA: Diagnosis not present

## 2019-12-25 DIAGNOSIS — R41841 Cognitive communication deficit: Secondary | ICD-10-CM | POA: Diagnosis not present

## 2019-12-25 LAB — VITAMIN D 25 HYDROXY (VIT D DEFICIENCY, FRACTURES): Vit D, 25-Hydroxy: 25

## 2019-12-26 DIAGNOSIS — R2681 Unsteadiness on feet: Secondary | ICD-10-CM | POA: Diagnosis not present

## 2019-12-26 DIAGNOSIS — M545 Low back pain: Secondary | ICD-10-CM | POA: Diagnosis not present

## 2019-12-26 DIAGNOSIS — R41841 Cognitive communication deficit: Secondary | ICD-10-CM | POA: Diagnosis not present

## 2019-12-26 DIAGNOSIS — F0390 Unspecified dementia without behavioral disturbance: Secondary | ICD-10-CM | POA: Diagnosis not present

## 2019-12-26 DIAGNOSIS — M25561 Pain in right knee: Secondary | ICD-10-CM | POA: Diagnosis not present

## 2019-12-27 DIAGNOSIS — M545 Low back pain: Secondary | ICD-10-CM | POA: Diagnosis not present

## 2019-12-27 DIAGNOSIS — F0390 Unspecified dementia without behavioral disturbance: Secondary | ICD-10-CM | POA: Diagnosis not present

## 2019-12-27 DIAGNOSIS — M25561 Pain in right knee: Secondary | ICD-10-CM | POA: Diagnosis not present

## 2019-12-27 DIAGNOSIS — R2681 Unsteadiness on feet: Secondary | ICD-10-CM | POA: Diagnosis not present

## 2019-12-27 DIAGNOSIS — R41841 Cognitive communication deficit: Secondary | ICD-10-CM | POA: Diagnosis not present

## 2019-12-28 DIAGNOSIS — R41841 Cognitive communication deficit: Secondary | ICD-10-CM | POA: Diagnosis not present

## 2019-12-28 DIAGNOSIS — F0390 Unspecified dementia without behavioral disturbance: Secondary | ICD-10-CM | POA: Diagnosis not present

## 2019-12-28 DIAGNOSIS — R2681 Unsteadiness on feet: Secondary | ICD-10-CM | POA: Diagnosis not present

## 2019-12-28 DIAGNOSIS — M25561 Pain in right knee: Secondary | ICD-10-CM | POA: Diagnosis not present

## 2019-12-28 DIAGNOSIS — M545 Low back pain: Secondary | ICD-10-CM | POA: Diagnosis not present

## 2019-12-29 ENCOUNTER — Encounter: Payer: Self-pay | Admitting: Nurse Practitioner

## 2019-12-29 DIAGNOSIS — R41841 Cognitive communication deficit: Secondary | ICD-10-CM | POA: Diagnosis not present

## 2019-12-29 DIAGNOSIS — N1831 Chronic kidney disease, stage 3a: Secondary | ICD-10-CM | POA: Insufficient documentation

## 2019-12-29 DIAGNOSIS — F0390 Unspecified dementia without behavioral disturbance: Secondary | ICD-10-CM | POA: Diagnosis not present

## 2019-12-29 DIAGNOSIS — R2681 Unsteadiness on feet: Secondary | ICD-10-CM | POA: Diagnosis not present

## 2019-12-29 DIAGNOSIS — M545 Low back pain: Secondary | ICD-10-CM | POA: Diagnosis not present

## 2019-12-29 DIAGNOSIS — M25561 Pain in right knee: Secondary | ICD-10-CM | POA: Diagnosis not present

## 2020-01-01 ENCOUNTER — Encounter: Payer: Self-pay | Admitting: Internal Medicine

## 2020-01-01 ENCOUNTER — Non-Acute Institutional Stay: Payer: Medicare Other | Admitting: Internal Medicine

## 2020-01-01 DIAGNOSIS — I2510 Atherosclerotic heart disease of native coronary artery without angina pectoris: Secondary | ICD-10-CM | POA: Diagnosis not present

## 2020-01-01 DIAGNOSIS — I10 Essential (primary) hypertension: Secondary | ICD-10-CM | POA: Diagnosis not present

## 2020-01-01 DIAGNOSIS — E559 Vitamin D deficiency, unspecified: Secondary | ICD-10-CM | POA: Diagnosis not present

## 2020-01-01 DIAGNOSIS — M545 Low back pain: Secondary | ICD-10-CM | POA: Diagnosis not present

## 2020-01-01 DIAGNOSIS — M810 Age-related osteoporosis without current pathological fracture: Secondary | ICD-10-CM

## 2020-01-01 DIAGNOSIS — F0390 Unspecified dementia without behavioral disturbance: Secondary | ICD-10-CM | POA: Diagnosis not present

## 2020-01-01 DIAGNOSIS — R2681 Unsteadiness on feet: Secondary | ICD-10-CM | POA: Diagnosis not present

## 2020-01-01 DIAGNOSIS — M25561 Pain in right knee: Secondary | ICD-10-CM | POA: Diagnosis not present

## 2020-01-01 DIAGNOSIS — R41841 Cognitive communication deficit: Secondary | ICD-10-CM | POA: Diagnosis not present

## 2020-01-01 DIAGNOSIS — G2581 Restless legs syndrome: Secondary | ICD-10-CM

## 2020-01-01 NOTE — Progress Notes (Signed)
Location:    [facility] Room Number: 15 Place of Service:  ALF [phone]) Provider:  Einar Crow MD   Mahlon Gammon, MD  Patient Care Team: Mahlon Gammon, MD as PCP - General (Internal Medicine) Susa Griffins, MD (Inactive) (Cardiology)  Extended Emergency Contact Information Primary Emergency Contact: Odelia Gage of [place] Home Phone: [phone] Relation: Friend Secondary Emergency Contact: Florian Buff States of [place] Home Phone: [phone] Relation: Friend  Code Status:  Full Code Goals of care: Advanced Directive information Advanced Directives 12/14/2019  Does Patient Have a Medical Advance Directive? Yes  Type of Advance Directive Living will  Copy of Healthcare Power of Attorney in Chart? -  Would patient like information on creating a medical advance directive? -     Chief Complaint  Patient presents with  . Acute Visit    Wide BP Variation with Low HR    HPI:  Pt is a 86 y.o. female seen today for an acute visit for follow-up of her blood pressure at home Patient has h/o Hypertension, Hyperlipidemia, CAD, MVP, Restless Leg, Hiatal Hernia, Osteoporosisand Arthritis  Patient has history of high blood pressure. On Diovan/HCT and high doses of Lopressor. Patient is having mild mild fluctuation of her blood pressure.  Some of her blood pressures are as low with systolic 80.  With heart rate of 50s. But then her blood pressure sometimes goes up to 170. Patient denies any complaints no dizziness.  She has not sustained any falls.  Past Medical History:  Diagnosis Date  . Anemia   . Carotid arterial disease (HCC)    asymptomatic   . Chest tightness   . Coronary artery disease   . High blood pressure   . Leg pain    with walking  . Palpitations    Past Surgical History:  Procedure Laterality Date  . CARDIAC CATHETERIZATION  07/15/04   noncritical CAD,80% PLA treated medically. EF% 60. no RAS.  [name]  CAROTID ENDARTERECTOMY     RIGHT=05/11/11, LEFT=03/30/11  . EYE SURGERY  2011   Cataract surgery Bilateral  . EYE SURGERY  2011   Bilateral eyelid surgery for ptosis  . renal artery duplex  02/23/13   ABDOMINAL AORTA:<50% DIAMETER REDUCTION. RIGHT RENAL ARTERY: 60-99% DIAMETER REDUCTION.[name] LEFT RENAL ARTERY:1-59% DIAMETER REDUCTION.    Allergies  Allergen Reactions  . Cardura [Doxazosin Mesylate]     Reaction not recalled by the patient  . Lotensin [Benazepril Hcl]     Reaction not recalled by the patient  . Tramadol Nausea And Vomiting    Allergies as of 01/01/2020      Reactions   Cardura [doxazosin Mesylate]    Reaction not recalled by the patient   Lotensin [benazepril Hcl]    Reaction not recalled by the patient   Tramadol Nausea And Vomiting      Medication List       Accurate as of January 01, 2020  3:24 PM. If you have any questions, ask your nurse or doctor.        aspirin EC 81 MG tablet Take 1 tablet (81 mg total) by mouth daily.   famotidine 20 MG tablet Commonly known as: PEPCID TAKE 1 TABLET BY MOUTH EVERYDAY AT BEDTIME   memantine 5 MG tablet Commonly known as: NAMENDA Take 5 mg by mouth 2 (two) times daily.   metoprolol tartrate 100 MG tablet Commonly known as: LOPRESSOR Take 100 mg by mouth 2 (two) times daily.  pantoprazole 40 MG tablet Commonly known as: PROTONIX TAKE 1 TABLET (40 MG TOTAL) BY MOUTH DAILY. TAKE 30-60 MIN BEFORE FIRST MEAL OF THE DAY   rOPINIRole 1 MG tablet Commonly known as: REQUIP Take 1 mg by mouth every evening.   rosuvastatin 10 MG tablet Commonly known as: CRESTOR Take 10 mg by mouth at bedtime.   valsartan-hydrochlorothiazide 160-25 MG tablet Commonly known as: DIOVAN-HCT Take 1 tablet by mouth daily.       Review of Systems Review of Systems  Constitutional: Negative for activity change, appetite change, chills, diaphoresis, fatigue and fever.  HENT: Negative for mouth sores, postnasal drip, rhinorrhea, sinus  pain and sore throat.   Respiratory: Negative for apnea, cough, chest tightness, shortness of breath and wheezing.   Cardiovascular: Negative for chest pain, palpitations and leg swelling.  Gastrointestinal: Negative for abdominal distention, abdominal pain, constipation, diarrhea, nausea and vomiting.  Genitourinary: Negative for dysuria and frequency.  Musculoskeletal: Negative for arthralgias, joint swelling and myalgias.  Skin: Negative for rash.  Neurological: Negative for dizziness, syncope, weakness, light-headedness and numbness.  Psychiatric/Behavioral: Negative for behavioral problems, confusion and sleep disturbance.     Immunization History  Administered Date(s) Administered  . Influenza Split 04/19/2014, 04/20/2015  . Influenza Whole 04/19/2016  . Influenza,inj,Quad PF,6+ Mos 04/14/2018  . Janssen (J&J) SARS-COV-2 Vaccination 08/21/2019  . Moderna SARS-COVID-2 Vaccination 07/24/2019  . Pneumococcal Conjugate-13 08/23/2014  . Tdap 09/21/2017   Pertinent  Health Maintenance Due  Topic Date Due  . DEXA SCAN  Never done  . PNA vac Low Risk Adult (2 of 2 - PPSV23) 08/24/2015  . INFLUENZA VACCINE  02/18/2020   Fall Risk  03/24/2016  Falls in the past year? No  Comment Emmi Telephone Survey: data to providers prior to load   Functional Status Survey:    Vitals:   01/01/20 1516  BP: (!) 161/62  Pulse: 67  Resp: 16  Temp: 97.8 F (36.6 C)  SpO2: 96%  Weight: 97 lb 6.4 oz (44.2 kg)  Height: 5\' 5"  (1.651 m)   Body mass index is 16.21 kg/m. Physical Exam  Constitutional: . Well-developed and well-nourished.  HENT:  Head: Normocephalic.  Mouth/Throat: Oropharynx is clear and moist.  Eyes: Pupils are equal, round, and reactive to light.  Neck: Neck supple.  Cardiovascular: Normal rate and normal heart sounds.  No murmur heard. Pulmonary/Chest: Effort normal and breath sounds normal. No respiratory distress. No wheezes. She has no rales.  Abdominal: Soft. Bowel  sounds are normal. No distension. There is no tenderness. There is no rebound.  Musculoskeletal: No edema.  Lymphadenopathy: none Neurological: No Focal Deficits Skin: Skin is warm and dry.  Psychiatric: Normal mood and affect. Behavior is normal. Thought content normal.    Labs reviewed: Recent Labs    11/09/19 1441  NA 133*  K 3.8  CL 95*  CO2 29  GLUCOSE 110*  BUN 16  CREATININE 1.09*  CALCIUM 9.0   Recent Labs    11/09/19 1441  AST 28  ALT 54*  ALKPHOS 82  BILITOT 0.7  PROT 6.4*  ALBUMIN 2.8*   Recent Labs    11/09/19 1441  WBC 10.0  NEUTROABS 7.7  HGB 11.4*  HCT 36.3  MCV 91.9  PLT 299   Lab Results  Component Value Date   TSH 2.130 11/02/2019   Lab Results  Component Value Date   HGBA1C 6.3 (H) 11/02/2019   No results found for: CHOL, HDL, LDLCALC, LDLDIRECT, TRIG, CHOLHDL  Significant Diagnostic  Results in last 30 days:  No results found.  Assessment/Plan  Essential hypertension vs Renovascular hbp With her Low SBP sometimes with Slow HR Will decrease her Metoprolol to 75 mg BID Check BP everyday Reval. Probably consider adding some other Meds  Vit D Def Will start her on Supplement  CAD Continue on aspirin, beta-blocker and Crestor Mixed hyperlipidemia Continue on Crestor  LDL 78 Restless leg syndrome Symptoms seem controlled on Requip Age-related osteoporosis without current pathological fracture Patient is on Prolia shot and per record her last shot was on 1120 She should be due for another shot Dementia without behavioral disturbance, unspecified dementia type ([place]) Now on Namenda  She got 18/30 on her MMSE Missed recall 0/3 and 0/5 in her Number counting Also did not know the year season month or date Her clock drawing Was good. Does look like mixed dementia MRI showed atrophy with chronic vessel changes Neurology suggested blood pressure control and lipid control    Family/ staff Communication:   Labs/tests  ordered:

## 2020-01-02 DIAGNOSIS — M25561 Pain in right knee: Secondary | ICD-10-CM | POA: Diagnosis not present

## 2020-01-02 DIAGNOSIS — F0390 Unspecified dementia without behavioral disturbance: Secondary | ICD-10-CM | POA: Diagnosis not present

## 2020-01-02 DIAGNOSIS — R41841 Cognitive communication deficit: Secondary | ICD-10-CM | POA: Diagnosis not present

## 2020-01-02 DIAGNOSIS — M545 Low back pain: Secondary | ICD-10-CM | POA: Diagnosis not present

## 2020-01-02 DIAGNOSIS — R2681 Unsteadiness on feet: Secondary | ICD-10-CM | POA: Diagnosis not present

## 2020-01-03 DIAGNOSIS — M25561 Pain in right knee: Secondary | ICD-10-CM | POA: Diagnosis not present

## 2020-01-03 DIAGNOSIS — M545 Low back pain: Secondary | ICD-10-CM | POA: Diagnosis not present

## 2020-01-03 DIAGNOSIS — R41841 Cognitive communication deficit: Secondary | ICD-10-CM | POA: Diagnosis not present

## 2020-01-03 DIAGNOSIS — F0390 Unspecified dementia without behavioral disturbance: Secondary | ICD-10-CM | POA: Diagnosis not present

## 2020-01-03 DIAGNOSIS — R2681 Unsteadiness on feet: Secondary | ICD-10-CM | POA: Diagnosis not present

## 2020-01-04 DIAGNOSIS — M545 Low back pain: Secondary | ICD-10-CM | POA: Diagnosis not present

## 2020-01-04 DIAGNOSIS — R2681 Unsteadiness on feet: Secondary | ICD-10-CM | POA: Diagnosis not present

## 2020-01-04 DIAGNOSIS — R41841 Cognitive communication deficit: Secondary | ICD-10-CM | POA: Diagnosis not present

## 2020-01-04 DIAGNOSIS — F0390 Unspecified dementia without behavioral disturbance: Secondary | ICD-10-CM | POA: Diagnosis not present

## 2020-01-04 DIAGNOSIS — M25561 Pain in right knee: Secondary | ICD-10-CM | POA: Diagnosis not present

## 2020-01-08 DIAGNOSIS — M545 Low back pain: Secondary | ICD-10-CM | POA: Diagnosis not present

## 2020-01-08 DIAGNOSIS — R41841 Cognitive communication deficit: Secondary | ICD-10-CM | POA: Diagnosis not present

## 2020-01-08 DIAGNOSIS — M25561 Pain in right knee: Secondary | ICD-10-CM | POA: Diagnosis not present

## 2020-01-08 DIAGNOSIS — F0390 Unspecified dementia without behavioral disturbance: Secondary | ICD-10-CM | POA: Diagnosis not present

## 2020-01-08 DIAGNOSIS — R2681 Unsteadiness on feet: Secondary | ICD-10-CM | POA: Diagnosis not present

## 2020-01-09 DIAGNOSIS — F0390 Unspecified dementia without behavioral disturbance: Secondary | ICD-10-CM | POA: Diagnosis not present

## 2020-01-09 DIAGNOSIS — M545 Low back pain: Secondary | ICD-10-CM | POA: Diagnosis not present

## 2020-01-09 DIAGNOSIS — M25561 Pain in right knee: Secondary | ICD-10-CM | POA: Diagnosis not present

## 2020-01-09 DIAGNOSIS — R41841 Cognitive communication deficit: Secondary | ICD-10-CM | POA: Diagnosis not present

## 2020-01-09 DIAGNOSIS — R2681 Unsteadiness on feet: Secondary | ICD-10-CM | POA: Diagnosis not present

## 2020-01-09 NOTE — Telephone Encounter (Signed)
[facility] and spoke with NIcole RN and gave the MIR brain results. She will give the pt the results and will have Dr.Gupta, Anjali L, MD to look at the report. I stated we can see Dr.Gupta notes and she can see the Mri in the system to review. Joni Reining RN verbalized understanding.

## 2020-01-10 DIAGNOSIS — M25561 Pain in right knee: Secondary | ICD-10-CM | POA: Diagnosis not present

## 2020-01-10 DIAGNOSIS — F0390 Unspecified dementia without behavioral disturbance: Secondary | ICD-10-CM | POA: Diagnosis not present

## 2020-01-10 DIAGNOSIS — R41841 Cognitive communication deficit: Secondary | ICD-10-CM | POA: Diagnosis not present

## 2020-01-10 DIAGNOSIS — M545 Low back pain: Secondary | ICD-10-CM | POA: Diagnosis not present

## 2020-01-10 DIAGNOSIS — R2681 Unsteadiness on feet: Secondary | ICD-10-CM | POA: Diagnosis not present

## 2020-01-11 DIAGNOSIS — F0390 Unspecified dementia without behavioral disturbance: Secondary | ICD-10-CM | POA: Diagnosis not present

## 2020-01-11 DIAGNOSIS — R2681 Unsteadiness on feet: Secondary | ICD-10-CM | POA: Diagnosis not present

## 2020-01-11 DIAGNOSIS — M25561 Pain in right knee: Secondary | ICD-10-CM | POA: Diagnosis not present

## 2020-01-11 DIAGNOSIS — M545 Low back pain: Secondary | ICD-10-CM | POA: Diagnosis not present

## 2020-01-11 DIAGNOSIS — R41841 Cognitive communication deficit: Secondary | ICD-10-CM | POA: Diagnosis not present

## 2020-01-16 DIAGNOSIS — M25561 Pain in right knee: Secondary | ICD-10-CM | POA: Diagnosis not present

## 2020-01-16 DIAGNOSIS — R2681 Unsteadiness on feet: Secondary | ICD-10-CM | POA: Diagnosis not present

## 2020-01-16 DIAGNOSIS — F0390 Unspecified dementia without behavioral disturbance: Secondary | ICD-10-CM | POA: Diagnosis not present

## 2020-01-16 DIAGNOSIS — R41841 Cognitive communication deficit: Secondary | ICD-10-CM | POA: Diagnosis not present

## 2020-01-16 DIAGNOSIS — M545 Low back pain: Secondary | ICD-10-CM | POA: Diagnosis not present

## 2020-01-25 DIAGNOSIS — R41841 Cognitive communication deficit: Secondary | ICD-10-CM | POA: Diagnosis not present

## 2020-01-30 DIAGNOSIS — R41841 Cognitive communication deficit: Secondary | ICD-10-CM | POA: Diagnosis not present

## 2020-02-05 DIAGNOSIS — R41841 Cognitive communication deficit: Secondary | ICD-10-CM | POA: Diagnosis not present

## 2020-02-23 ENCOUNTER — Non-Acute Institutional Stay: Payer: Medicare Other | Admitting: Nurse Practitioner

## 2020-02-23 ENCOUNTER — Encounter: Payer: Self-pay | Admitting: Nurse Practitioner

## 2020-02-23 DIAGNOSIS — E559 Vitamin D deficiency, unspecified: Secondary | ICD-10-CM | POA: Insufficient documentation

## 2020-02-23 DIAGNOSIS — K219 Gastro-esophageal reflux disease without esophagitis: Secondary | ICD-10-CM

## 2020-02-23 DIAGNOSIS — I2510 Atherosclerotic heart disease of native coronary artery without angina pectoris: Secondary | ICD-10-CM

## 2020-02-23 DIAGNOSIS — F0280 Dementia in other diseases classified elsewhere without behavioral disturbance: Secondary | ICD-10-CM

## 2020-02-23 DIAGNOSIS — G2581 Restless legs syndrome: Secondary | ICD-10-CM | POA: Diagnosis not present

## 2020-02-23 DIAGNOSIS — G309 Alzheimer's disease, unspecified: Secondary | ICD-10-CM

## 2020-02-23 DIAGNOSIS — M810 Age-related osteoporosis without current pathological fracture: Secondary | ICD-10-CM | POA: Diagnosis not present

## 2020-02-23 DIAGNOSIS — I10 Essential (primary) hypertension: Secondary | ICD-10-CM

## 2020-02-23 DIAGNOSIS — F0150 Vascular dementia without behavioral disturbance: Secondary | ICD-10-CM | POA: Diagnosis not present

## 2020-02-23 NOTE — Assessment & Plan Note (Signed)
Mild elevated Sbp, continue Metoprolol, Valsartan/HCT

## 2020-02-23 NOTE — Assessment & Plan Note (Signed)
No angina reported, continue Metoprolol, ASA, Crestor.

## 2020-02-23 NOTE — Progress Notes (Signed)
Location:   [facility] Room Number: 15 Place of Service:  ALF [phone]) Provider:  Chipper Oman, NP  Mahlon Gammon, MD  Patient Care Team: Mahlon Gammon, MD as PCP - General (Internal Medicine) Susa Griffins, MD (Inactive) (Cardiology)  Extended Emergency Contact Information Primary Emergency Contact: Odelia Gage of [place] Home Phone: [phone] Relation: Friend Secondary Emergency Contact: Florian Buff [place] of [place] Home Phone: [phone] Relation: Friend  Code Status:  DNR Goals of care: Advanced Directive information Advanced Directives 02/23/2020  Does Patient Have a Medical Advance Directive? Yes  Type of Estate agent of [place];Living will;Out of facility DNR (pink MOST or yellow form)  Does patient want to make changes to medical advance directive? No - Patient declined  Copy of Healthcare Power of Attorney in Chart? Yes - validated most recent copy scanned in chart (See row information)  Would patient like information on creating a medical advance directive? -  Pre-existing out of facility DNR order (yellow form or pink MOST form) Yellow form placed in chart (order not valid for inpatient use);Pink MOST form placed in chart (order not valid for inpatient use)     Chief Complaint  Patient presents with  . Medical Management of Chronic Issues    Routine follow up visit.  . Quality Metric Gaps    DEXA scan  . Best Practice Recommendations    Pneumonia vaccine, Flu vaccine    HPI:  Pt is a 86 y.o. female seen today for medical management of chronic diseases.    HTN, 158/62 today, takes Metoprolol 75mg  bid since 01/01/20, Valsartan/HCT 160/25mg  qd.  Vit D deficiency, takes Vit D  CAD, ASA, Metoprolol, Crestor  RLS takes Requip in pm  OP, takes Prolia, due DEXA  Dementia, takes Memantine since 12/31/19,  MRI brain: atrophy, chronic vessel changes, MMSE 18/30, underwent neurology  consultation.   GERD, takes Famotidine, Pantoprazole   Past Medical History:  Diagnosis Date  . Anemia   . Carotid arterial disease (HCC)    asymptomatic   . Chest tightness   . Coronary artery disease   . High blood pressure   . Leg pain    with walking  . Palpitations    Past Surgical History:  Procedure Laterality Date  . CARDIAC CATHETERIZATION  07/15/04   noncritical CAD,80% PLA treated medically. EF% 60. no RAS.  07/17/04 CAROTID ENDARTERECTOMY     RIGHT=05/11/11, LEFT=03/30/11  . EYE SURGERY  2011   Cataract surgery Bilateral  . EYE SURGERY  2011   Bilateral eyelid surgery for ptosis  . renal artery duplex  02/23/13   ABDOMINAL AORTA:<50% DIAMETER REDUCTION. RIGHT RENAL ARTERY: 60-99% DIAMETER REDUCTION.04/25/13 LEFT RENAL ARTERY:1-59% DIAMETER REDUCTION.    Allergies  Allergen Reactions  . Cardura [Doxazosin Mesylate]     Reaction not recalled by the patient  . Lotensin [Benazepril Hcl]     Reaction not recalled by the patient  . Tramadol Nausea And Vomiting    Allergies as of 02/23/2020      Reactions   Cardura [doxazosin Mesylate]    Reaction not recalled by the patient   Lotensin [benazepril Hcl]    Reaction not recalled by the patient   Tramadol Nausea And Vomiting      Medication List       Accurate as of February 23, 2020 12:17 PM. If you have any questions, ask your nurse or doctor.        aspirin EC 81  MG tablet Take 1 tablet (81 mg total) by mouth daily.   famotidine 20 MG tablet Commonly known as: PEPCID TAKE 1 TABLET BY MOUTH EVERYDAY AT BEDTIME   memantine 5 MG tablet Commonly known as: NAMENDA Take 5 mg by mouth 2 (two) times daily.   Metoprolol Tartrate 75 MG Tabs Take 75 mg by mouth 2 (two) times daily.   pantoprazole 40 MG tablet Commonly known as: PROTONIX TAKE 1 TABLET (40 MG TOTAL) BY MOUTH DAILY. TAKE 30-60 MIN BEFORE FIRST MEAL OF THE DAY   rOPINIRole 1 MG tablet Commonly known as: REQUIP Take 1 mg by mouth every evening.     rosuvastatin 10 MG tablet Commonly known as: CRESTOR Take 10 mg by mouth at bedtime.   valsartan-hydrochlorothiazide 160-25 MG tablet Commonly known as: DIOVAN-HCT Take 1 tablet by mouth daily.   Vitamin D3 25 MCG tablet Commonly known as: Vitamin D Take 1,000 Units by mouth daily.       Review of Systems  Constitutional: Negative for activity change, appetite change, fever and unexpected weight change.  HENT: Positive for hearing loss. Negative for congestion, trouble swallowing and voice change.   Eyes: Negative for visual disturbance.  Respiratory: Negative for cough, shortness of breath and wheezing.   Cardiovascular: Negative for chest pain and leg swelling.  Gastrointestinal: Negative for abdominal pain, constipation, nausea and vomiting.  Genitourinary: Negative for dysuria, frequency and urgency.  Musculoskeletal: Negative for gait problem.  Skin: Negative for color change.  Neurological: Negative for speech difficulty, weakness, light-headedness and headaches.  Psychiatric/Behavioral: Negative for behavioral problems and sleep disturbance. The patient is not nervous/anxious.     Immunization History  Administered Date(s) Administered  . Influenza Split 04/19/2014, 04/20/2015  . Influenza Whole 04/19/2016  . Influenza,inj,Quad PF,6+ Mos 04/14/2018  . Janssen (J&J) SARS-COV-2 Vaccination 08/21/2019  . Moderna SARS-COVID-2 Vaccination 07/24/2019  . Pneumococcal Conjugate-13 08/23/2014  . Tdap 09/21/2017   Pertinent  Health Maintenance Due  Topic Date Due  . DEXA SCAN  Never done  . PNA vac Low Risk Adult (2 of 2 - PPSV23) 08/24/2015  . INFLUENZA VACCINE  02/18/2020   Fall Risk  03/24/2016  Falls in the past year? No  Comment Emmi Telephone Survey: data to providers prior to load   Functional Status Survey:    Vitals:   02/23/20 1026  BP: (!) 158/62  Pulse: 65  Resp: (!) 22  Temp: (!) 97.3 F (36.3 C)  SpO2: 92%  Weight: 97 lb 6.4 oz (44.2 kg)   Height: 5\' 5"  (1.651 m)   Body mass index is 16.21 kg/m. Physical Exam Vitals and nursing note reviewed.  Constitutional:      General: She is not in acute distress.    Appearance: Normal appearance. She is not ill-appearing.     Comments: Low body weight, gradual weight loss about #3-4 Ibs in the past 3-4 months.   HENT:     Head: Normocephalic and atraumatic.     Nose: Nose normal.     Mouth/Throat:     Mouth: Mucous membranes are moist.  Eyes:     Extraocular Movements: Extraocular movements intact.     Conjunctiva/sclera: Conjunctivae normal.     Pupils: Pupils are equal, round, and reactive to light.  Cardiovascular:     Rate and Rhythm: Normal rate and regular rhythm.     Heart sounds: No murmur heard.   Pulmonary:     Effort: Pulmonary effort is normal.     Breath sounds:  No wheezing, rhonchi or rales.  Abdominal:     General: Bowel sounds are normal.     Palpations: Abdomen is soft.     Tenderness: There is no abdominal tenderness. There is no right CVA tenderness, left CVA tenderness or guarding.  Musculoskeletal:     Cervical back: Normal range of motion and neck supple.     Right lower leg: No edema.     Left lower leg: No edema.  Skin:    General: Skin is warm and dry.  Neurological:     General: No focal deficit present.     Mental Status: She is alert. Mental status is at baseline.     Motor: No weakness.     Coordination: Coordination normal.     Gait: Gait abnormal.     Comments: Oriented to person, place. Furniture, rails walking sometimes.  Psychiatric:        Mood and Affect: Mood normal.        Behavior: Behavior normal.     Labs reviewed: Recent Labs    11/09/19 1441  NA 133*  K 3.8  CL 95*  CO2 29  GLUCOSE 110*  BUN 16  CREATININE 1.09*  CALCIUM 9.0   Recent Labs    11/09/19 1441  AST 28  ALT 54*  ALKPHOS 82  BILITOT 0.7  PROT 6.4*  ALBUMIN 2.8*   Recent Labs    11/09/19 1441  WBC 10.0  NEUTROABS 7.7  HGB 11.4*  HCT  36.3  MCV 91.9  PLT 299   Lab Results  Component Value Date   TSH 2.130 11/02/2019   Lab Results  Component Value Date   HGBA1C 6.3 (H) 11/02/2019   No results found for: CHOL, HDL, LDLCALC, LDLDIRECT, TRIG, CHOLHDL  Significant Diagnostic Results in last 30 days:  No results found.  Assessment/Plan CAD (coronary artery disease) No angina reported, continue Metoprolol, ASA, Crestor.   Essential hypertension vs Renovascular hbp Mild elevated Sbp, continue Metoprolol, Valsartan/HCT  Vitamin D deficiency Continue Vit D  Restless leg syndrome Stable, continue Requip qpm  Mixed dementia (HCC) No behavioral issues, continue AL [facility] for safety, care assistance, continue Memantine.   Osteoporosis Continue Prolia, due for DEXA  GERD (gastroesophageal reflux disease) Stable, continue Pantoprazole, Famotidine.      Family/ staff Communication: plan of care reviewed with the patient and charge nurse.   Labs/tests ordered:  none  Time spend 40 minutes.

## 2020-02-23 NOTE — Assessment & Plan Note (Signed)
Stable, continue Requip qpm

## 2020-02-23 NOTE — Assessment & Plan Note (Signed)
Stable, continue Pantoprazole, Famotidine.  

## 2020-02-23 NOTE — Assessment & Plan Note (Signed)
Continue Vit D 

## 2020-02-23 NOTE — Assessment & Plan Note (Signed)
Continue Prolia, due for DEXA

## 2020-02-23 NOTE — Assessment & Plan Note (Signed)
No behavioral issues, continue AL [facility] for safety, care assistance, continue Memantine.

## 2020-03-11 DIAGNOSIS — H524 Presbyopia: Secondary | ICD-10-CM | POA: Diagnosis not present

## 2020-03-11 DIAGNOSIS — H532 Diplopia: Secondary | ICD-10-CM | POA: Diagnosis not present

## 2020-03-11 DIAGNOSIS — Z961 Presence of intraocular lens: Secondary | ICD-10-CM | POA: Diagnosis not present

## 2020-03-21 ENCOUNTER — Encounter: Payer: Self-pay | Admitting: Internal Medicine

## 2020-03-21 ENCOUNTER — Non-Acute Institutional Stay: Payer: Medicare Other | Admitting: Internal Medicine

## 2020-03-21 DIAGNOSIS — G309 Alzheimer's disease, unspecified: Secondary | ICD-10-CM | POA: Diagnosis not present

## 2020-03-21 DIAGNOSIS — M810 Age-related osteoporosis without current pathological fracture: Secondary | ICD-10-CM

## 2020-03-21 DIAGNOSIS — F0280 Dementia in other diseases classified elsewhere without behavioral disturbance: Secondary | ICD-10-CM

## 2020-03-21 DIAGNOSIS — G2581 Restless legs syndrome: Secondary | ICD-10-CM

## 2020-03-21 DIAGNOSIS — I10 Essential (primary) hypertension: Secondary | ICD-10-CM | POA: Diagnosis not present

## 2020-03-21 DIAGNOSIS — F0150 Vascular dementia without behavioral disturbance: Secondary | ICD-10-CM

## 2020-03-21 DIAGNOSIS — E559 Vitamin D deficiency, unspecified: Secondary | ICD-10-CM | POA: Diagnosis not present

## 2020-03-21 DIAGNOSIS — I2510 Atherosclerotic heart disease of native coronary artery without angina pectoris: Secondary | ICD-10-CM

## 2020-03-21 NOTE — Progress Notes (Signed)
Location:   [facility] Room Number: 15 Place of Service:  ALF [phone]) Provider:  Einar Crow, MD  Mahlon Gammon, MD  Patient Care Team: Mahlon Gammon, MD as PCP - General (Internal Medicine) Susa Griffins, MD (Inactive) (Cardiology)  Extended Emergency Contact Information Primary Emergency Contact: Odelia Gage of [place] Home Phone: [phone] Relation: Friend Secondary Emergency Contact: Florian Buff [place] of [place] Home Phone: [phone] Relation: Friend  Code Status:  DNR Goals of care: Advanced Directive information Advanced Directives 03/21/2020  Does Patient Have a Medical Advance Directive? Yes  Type of Estate agent of [place];Out of facility DNR (pink MOST or yellow form)  Does patient want to make changes to medical advance directive? No - Patient declined  Copy of Healthcare Power of Attorney in Chart? Yes - validated most recent copy scanned in chart (See row information)  Would patient like information on creating a medical advance directive? -  Pre-existing out of facility DNR order (yellow form or pink MOST form) Yellow form placed in chart (order not valid for inpatient use);Pink MOST form placed in chart (order not valid for inpatient use)     Chief Complaint  Patient presents with  . Acute Visit    Low BP    HPI:  Pt is a 86 y.o. female seen today for an acute visit for Low BP with Low HR this Morning  Patient has h/o Hypertension, Hyperlipidemia, CAD, MVP, Restless Leg, Hiatal Hernia, Osteoporosisand Arthritis  Continues to have Episodes of Low BP with Low HR of 50s Nurses had to hold her Metoprolol this morning Denies any dizziness. Doing well otherwise Weight has improved. Walks around with her walker. No Falls recently   Past Medical History:  Diagnosis Date  . Anemia   . Carotid arterial disease (HCC)    asymptomatic   . Chest tightness   . Coronary artery  disease   . High blood pressure   . Leg pain    with walking  . Palpitations    Past Surgical History:  Procedure Laterality Date  . CARDIAC CATHETERIZATION  07/15/04   noncritical CAD,80% PLA treated medically. EF% 60. no RAS.  [name] CAROTID ENDARTERECTOMY     RIGHT=05/11/11, LEFT=03/30/11  . EYE SURGERY  2011   Cataract surgery Bilateral  . EYE SURGERY  2011   Bilateral eyelid surgery for ptosis  . renal artery duplex  02/23/13   ABDOMINAL AORTA:<50% DIAMETER REDUCTION. RIGHT RENAL ARTERY: 60-99% DIAMETER REDUCTION.[name] LEFT RENAL ARTERY:1-59% DIAMETER REDUCTION.    Allergies  Allergen Reactions  . Cardura [Doxazosin Mesylate]     Reaction not recalled by the patient  . Lotensin [Benazepril Hcl]     Reaction not recalled by the patient  . Tramadol Nausea And Vomiting    Allergies as of 03/21/2020      Reactions   Cardura [doxazosin Mesylate]    Reaction not recalled by the patient   Lotensin [benazepril Hcl]    Reaction not recalled by the patient   Tramadol Nausea And Vomiting      Medication List       Accurate as of March 21, 2020 10:56 PM. If you have any questions, ask your nurse or doctor.        aspirin EC 81 MG tablet Take 1 tablet (81 mg total) by mouth daily.   famotidine 20 MG tablet Commonly known as: PEPCID TAKE 1 TABLET BY MOUTH EVERYDAY AT BEDTIME   memantine 5  MG tablet Commonly known as: NAMENDA Take 5 mg by mouth 2 (two) times daily.   Metoprolol Tartrate 75 MG Tabs Take 50 mg by mouth 2 (two) times daily.   pantoprazole 40 MG tablet Commonly known as: PROTONIX TAKE 1 TABLET (40 MG TOTAL) BY MOUTH DAILY. TAKE 30-60 MIN BEFORE FIRST MEAL OF THE DAY   rOPINIRole 1 MG tablet Commonly known as: REQUIP Take 1 mg by mouth every evening.   rosuvastatin 10 MG tablet Commonly known as: CRESTOR Take 10 mg by mouth at bedtime.   valsartan-hydrochlorothiazide 160-25 MG tablet Commonly known as: DIOVAN-HCT Take 1 tablet by mouth daily.     Vitamin D3 25 MCG tablet Commonly known as: Vitamin D Take 1,000 Units by mouth daily.       Review of Systems Review of Systems  Constitutional: Negative for activity change, appetite change, chills, diaphoresis, fatigue and fever.  HENT: Negative for mouth sores, postnasal drip, rhinorrhea, sinus pain and sore throat.   Respiratory: Negative for apnea, cough, chest tightness, shortness of breath and wheezing.   Cardiovascular: Negative for chest pain, palpitations and leg swelling.  Gastrointestinal: Negative for abdominal distention, abdominal pain, constipation, diarrhea, nausea and vomiting.  Genitourinary: Negative for dysuria and frequency.  Musculoskeletal: Negative for arthralgias, joint swelling and myalgias.  Skin: Negative for rash.  Neurological: Negative for dizziness, syncope, weakness, light-headedness and numbness.  Psychiatric/Behavioral: Negative for behavioral problems, confusion and sleep disturbance.    Immunization History  Administered Date(s) Administered  . Influenza Split 04/19/2014, 04/20/2015  . Influenza Whole 04/19/2016  . Influenza,inj,Quad PF,6+ Mos 04/14/2018  . Janssen (J&J) SARS-COV-2 Vaccination 08/21/2019  . Moderna SARS-COVID-2 Vaccination 07/24/2019  . Pneumococcal Conjugate-13 08/23/2014  . Tdap 09/21/2017   Pertinent  Health Maintenance Due  Topic Date Due  . DEXA SCAN  Never done  . PNA vac Low Risk Adult (2 of 2 - PPSV23) 08/24/2015  . INFLUENZA VACCINE  02/18/2020   Fall Risk  03/24/2016  Falls in the past year? No  Comment Emmi Telephone Survey: data to providers prior to load   Functional Status Survey:    Vitals:   03/21/20 1459  BP: (!) 158/56  Pulse: 70  Resp: 18  Temp: (!) 97 F (36.1 C)  SpO2: 96%  Weight: 99 lb (44.9 kg)  Height: 5\' 5"  (1.651 m)   Body mass index is 16.47 kg/m. Physical Exam  Constitutional: Oriented to person, place, and time. Well-developed and well-nourished.  HENT:  Head:  Normocephalic.  Mouth/Throat: Oropharynx is clear and moist.  Eyes: Pupils are equal, round, and reactive to light.  Neck: Neck supple.  Cardiovascular: Normal rate and normal heart sounds.  No murmur heard. Pulmonary/Chest: Effort normal and breath sounds normal. No respiratory distress. No wheezes. She has no rales.  Abdominal: Soft. Bowel sounds are normal. No distension. There is no tenderness. There is no rebound.  Musculoskeletal: No edema.  Lymphadenopathy: none Neurological: Alert and oriented to person, place, and time.  Skin: Skin is warm and dry.  Psychiatric: Normal mood and affect. Behavior is normal. Thought content normal.    Labs reviewed: Recent Labs    11/09/19 1441  NA 133*  K 3.8  CL 95*  CO2 29  GLUCOSE 110*  BUN 16  CREATININE 1.09*  CALCIUM 9.0   Recent Labs    11/09/19 1441  AST 28  ALT 54*  ALKPHOS 82  BILITOT 0.7  PROT 6.4*  ALBUMIN 2.8*   Recent Labs    11/09/19  1441  WBC 10.0  NEUTROABS 7.7  HGB 11.4*  HCT 36.3  MCV 91.9  PLT 299   Lab Results  Component Value Date   TSH 2.130 11/02/2019   Lab Results  Component Value Date   HGBA1C 6.3 (H) 11/02/2019   No results found for: CHOL, HDL, LDLCALC, LDLDIRECT, TRIG, CHOLHDL  Significant Diagnostic Results in last 30 days:  No results found.  Assessment/Plan  Essential hypertension vs Renovascular hbp With her Low SBP sometimes with Slow HR Will Decrease the Dose of metoprolol to 50 mg BID  Vit D Def On Supplement  Other issues CAD Continue on aspirin, beta-blocker and Crestor Mixed hyperlipidemia Continue on Crestor  LDL 78 Restless leg syndrome Symptoms seem controlled on Requip Age-related osteoporosis without current pathological fracture Patient is on Prolia shot  Dementia without behavioral disturbance, unspecified dementia type (HCC) Now on Namenda She got 18/30 on her MMSE Missed recall 0/3 and 0/5 in her Number counting Also did not know the year  season month or date Her clock drawing Was good. Does look like mixed dementia MRI showed atrophy with chronic vessel changes Neurology suggested blood pressure control and lipid control    Family/ staff Communication:   Labs/tests ordered:

## 2020-03-28 DIAGNOSIS — F0390 Unspecified dementia without behavioral disturbance: Secondary | ICD-10-CM | POA: Diagnosis not present

## 2020-03-28 DIAGNOSIS — D649 Anemia, unspecified: Secondary | ICD-10-CM | POA: Diagnosis not present

## 2020-03-28 LAB — HEPATIC FUNCTION PANEL
ALT: 22 (ref 7–35)
AST: 18 (ref 13–35)
Alkaline Phosphatase: 70 (ref 25–125)
Bilirubin, Total: 0.7

## 2020-03-28 LAB — BASIC METABOLIC PANEL
BUN: 25 — AB (ref 4–21)
CO2: 33 — AB (ref 13–22)
Chloride: 97 — AB (ref 99–108)
Creatinine: 0.9 (ref 0.5–1.1)
Glucose: 87
Potassium: 4.1 (ref 3.4–5.3)
Sodium: 138 (ref 137–147)

## 2020-03-28 LAB — CBC AND DIFFERENTIAL
Hemoglobin: 14.3 (ref 12.0–16.0)
WBC: 7.2

## 2020-03-28 LAB — COMPREHENSIVE METABOLIC PANEL
Albumin: 4.1 (ref 3.5–5.0)
Calcium: 10.5 (ref 8.7–10.7)
GFR calc Af Amer: 61
GFR calc non Af Amer: 53
Globulin: 2.3

## 2020-03-28 LAB — CBC: RBC: 4.69 (ref 3.87–5.11)

## 2020-04-09 ENCOUNTER — Other Ambulatory Visit: Payer: Self-pay

## 2020-04-09 LAB — HEPATIC FUNCTION PANEL
ALT: 22 (ref 7–35)
AST: 18 (ref 13–35)
Alkaline Phosphatase: 70 (ref 25–125)
Bilirubin, Total: 0.7

## 2020-04-09 LAB — CBC AND DIFFERENTIAL
HCT: 43 (ref 36–46)
Hemoglobin: 14.3 (ref 12.0–16.0)
Platelets: 198 (ref 150–399)
WBC: 7.2

## 2020-04-09 LAB — COMPREHENSIVE METABOLIC PANEL
Albumin: 4.1 (ref 3.5–5.0)
Calcium: 10.5 (ref 8.7–10.7)
Globulin: 2.3

## 2020-04-09 LAB — BASIC METABOLIC PANEL
BUN: 25 — AB (ref 4–21)
CO2: 33 — AB (ref 13–22)
Chloride: 97 — AB (ref 99–108)
Creatinine: 0.9 (ref 0.5–1.1)
Glucose: 87
Potassium: 4.1 (ref 3.4–5.3)
Sodium: 138 (ref 137–147)

## 2020-04-09 LAB — CBC: RBC: 4.69 (ref 3.87–5.11)

## 2020-04-09 NOTE — Telephone Encounter (Signed)
Patient does not drive and prefers not to take the facility van. Someone would have to pick up Rx from pharmacy.

## 2020-04-09 NOTE — Telephone Encounter (Signed)
AL nurse Arlina Robes states the patient need to know how much it will cost because its on her medicare medication plan. She put it hold in the past due to the cost in the past according to [place].

## 2020-04-16 MED ORDER — DENOSUMAB 60 MG/ML ~~LOC~~ SOSY: 60.0000 mg | PREFILLED_SYRINGE | SUBCUTANEOUS | 0 refills | Status: DC

## 2020-04-16 NOTE — Telephone Encounter (Addendum)
Called patient to confirm if she wants Prolia Rx sent to pharmacy or not. Patient states she had an apnt Oct. 1st to get injection at our office. Confirmed with office and she has not had one ordered and would need it to be sent to pharmacy. Discussed with patient, she does not want her insurance billed for the office giving it to her.

## 2020-04-16 NOTE — Telephone Encounter (Signed)
Rx called into pharmacy and will be ready for patient to pick tomorrow for AL nrse to administer. Oct. 1 st apnt cancelled.

## 2020-04-17 ENCOUNTER — Telehealth: Payer: Self-pay

## 2020-04-17 NOTE — Telephone Encounter (Signed)
I called [facility] and spoke with Joni Reining in [place], Joni Reining states she was not aware that patient may get billed an injection fee if administered here at [facility].  I informed her per conversation with one of my team members that was the reason patient called earlier this week and canceled appointment.  Joni Reining is going to check to make sure that if she gives it onsite at [facility] patient would not have a fee. Joni Reining decided to keep appointment for now and plans to call back and cancel if no fee will be applied at [facility].

## 2020-04-17 NOTE — Telephone Encounter (Signed)
-----   Message from Mahlon Gammon, MD sent at 04/17/2020  2:50 PM EDT ----- She is coherent enough but has dementia. She would not understand you. Talk to [place] in [place] she must have called you. I don't know how we can explain her about the financial part. Probably Joni Reining can help [place] with that. It was going to cost her $900 to get that injection in Facility so this was better option for her ----- Message ----- From: Maurice Small, CMA Sent: 04/17/2020   2:29 PM EDT To: Mahlon Gammon, MD, Worthy Flank, CMA  I spoke with Misty Stanley and she said this patient will be billed an administration fee which is usually 48-60 dollars. What I need to know is if I call this patient and try to have a conversation with her is she coherent? You all are familiar with this lady and I have never encountered her before.   Per Misty Stanley the patient can get the staff at [facility] to administer the injection and this has been explained to the patient yet we do not know if she is comprehending what is being said.    But for some reason a nurse called here and scheduled for patient to get at [facility]  ----- Message ----- From: Worthy Flank, CMA Sent: 04/17/2020   2:15 PM EDT To: Maurice Small, CMA, Mahlon Gammon, MD  She missed her May dose. Talk with Misty Stanley because we have having a difficult time with who will be administering the injection and where is covered. Patient says she does not want her insurance possibly billed as Misty Stanley says for the office to administer.  ----- Message ----- From: Maurice Small, CMA Sent: 04/17/2020   2:02 PM EDT To: Mahlon Gammon, MD, Worthy Flank, CMA  Hi,   Nurse from [facility] called today and scheduled patient for a prolia injection for Friday. Based on documentation last injection was November 2020. I seen several times where it was documented that patient was due for injection yet I am not sure if she ever received in May for that was when she was due.  I am not  familiar with this patient and her mental status. Do you know if I called this patient to ask when her last injection was that she would be able to provide that information?

## 2020-04-19 ENCOUNTER — Ambulatory Visit: Payer: Medicare Other

## 2020-04-19 ENCOUNTER — Ambulatory Visit: Payer: Self-pay

## 2020-04-22 ENCOUNTER — Encounter: Payer: Self-pay | Admitting: Nurse Practitioner

## 2020-04-22 ENCOUNTER — Non-Acute Institutional Stay: Payer: Medicare Other | Admitting: Nurse Practitioner

## 2020-04-22 DIAGNOSIS — K219 Gastro-esophageal reflux disease without esophagitis: Secondary | ICD-10-CM | POA: Diagnosis not present

## 2020-04-22 DIAGNOSIS — M810 Age-related osteoporosis without current pathological fracture: Secondary | ICD-10-CM

## 2020-04-22 DIAGNOSIS — G309 Alzheimer's disease, unspecified: Secondary | ICD-10-CM | POA: Diagnosis not present

## 2020-04-22 DIAGNOSIS — I2510 Atherosclerotic heart disease of native coronary artery without angina pectoris: Secondary | ICD-10-CM

## 2020-04-22 DIAGNOSIS — E559 Vitamin D deficiency, unspecified: Secondary | ICD-10-CM

## 2020-04-22 DIAGNOSIS — F0150 Vascular dementia without behavioral disturbance: Secondary | ICD-10-CM | POA: Diagnosis not present

## 2020-04-22 DIAGNOSIS — G2581 Restless legs syndrome: Secondary | ICD-10-CM | POA: Diagnosis not present

## 2020-04-22 DIAGNOSIS — M545 Low back pain, unspecified: Secondary | ICD-10-CM

## 2020-04-22 DIAGNOSIS — I10 Essential (primary) hypertension: Secondary | ICD-10-CM

## 2020-04-22 DIAGNOSIS — F0280 Dementia in other diseases classified elsewhere without behavioral disturbance: Secondary | ICD-10-CM

## 2020-04-22 NOTE — Assessment & Plan Note (Signed)
Controlled, continue Requip

## 2020-04-22 NOTE — Assessment & Plan Note (Signed)
Dementia, takes Memantine since 12/31/19,  MRI brain: atrophy, chronic vessel changes, MMSE 18/30, underwent neurology consultation.

## 2020-04-22 NOTE — Progress Notes (Signed)
Location:   [facility] Room Number: 15 Place of Service:  ALF (13) Provider: Arna Snipe Tamela Elsayed NP  Mahlon Gammon, MD  Patient Care Team: Mahlon Gammon, MD as PCP - General (Internal Medicine) Susa Griffins, MD (Inactive) (Cardiology)  Extended Emergency Contact Information Primary Emergency Contact: Franne Forts [place] Home Phone: [phone] Relation: Friend Secondary Emergency Contact: Florian Buff [place] Home Phone: [phone] Relation: Friend  Code Status:  DNR Goals of care: Advanced Directive information Advanced Directives 03/21/2020  Does Patient Have a Medical Advance Directive? Yes  Type of Estate agent of [place];Out of facility DNR (pink MOST or yellow form)  Does patient want to make changes to medical advance directive? No - Patient declined  Copy of Healthcare Power of Attorney in Chart? Yes - validated most recent copy scanned in chart (See row information)  Would patient like information on creating a medical advance directive? -  Pre-existing out of facility DNR order (yellow form or pink MOST form) Yellow form placed in chart (order not valid for inpatient use);Pink MOST form placed in chart (order not valid for inpatient use)     Chief Complaint  Patient presents with  . Acute Visit    Back pain    HPI:  Pt is a 86 y.o. female seen today for an acute visit for c/o lower back R+L SIJ region aches/pain am, not new, aches in nature, denied urinary frequency, urgency, trauma, not positional, not traveling.   HTN, occasionally elevated Sbp in 160-170s, denied headache, dizziness, change of vision, chest pain/pressure, or palpitation, takes.  Metoprolol was decreased to 50mg  bid 03/21/20 in considering essential HTN vs renovascular HTN, takes Valsartan/HCT             Vit D deficiency, takes Vit D             CAD, ASA, Metoprolol, Crestor             RLS takes Requip in pm              OP, takes Prolia             Dementia, takes Memantine since 12/31/19,  MRI brain: atrophy, chronic vessel changes, MMSE 18/30, underwent neurology consultation.              GERD, takes Famotidine, Pantoprazole   Past Medical History:  Diagnosis Date  . Anemia   . Carotid arterial disease (HCC)    asymptomatic   . Chest tightness   . Coronary artery disease   . High blood pressure   . Leg pain    with walking  . Palpitations    Past Surgical History:  Procedure Laterality Date  . CARDIAC CATHETERIZATION  07/15/04   noncritical CAD,80% PLA treated medically. EF% 60. no RAS.  07/17/04 CAROTID ENDARTERECTOMY     RIGHT=05/11/11, LEFT=03/30/11  . EYE SURGERY  2011   Cataract surgery Bilateral  . EYE SURGERY  2011   Bilateral eyelid surgery for ptosis  . renal artery duplex  02/23/13   ABDOMINAL AORTA:<50% DIAMETER REDUCTION. RIGHT RENAL ARTERY: 60-99% DIAMETER REDUCTION.04/25/13 LEFT RENAL ARTERY:1-59% DIAMETER REDUCTION.    Allergies  Allergen Reactions  . Cardura [Doxazosin Mesylate]     Reaction not recalled by the patient  . Lotensin [Benazepril Hcl]     Reaction not recalled by the patient  . Tramadol Nausea And Vomiting    Allergies as of 04/22/2020  Reactions   Cardura [doxazosin Mesylate]    Reaction not recalled by the patient   Lotensin [benazepril Hcl]    Reaction not recalled by the patient   Tramadol Nausea And Vomiting      Medication List       Accurate as of April 22, 2020 11:59 PM. If you have any questions, ask your nurse or doctor.        aspirin EC 81 MG tablet Take 1 tablet (81 mg total) by mouth daily.   denosumab 60 MG/ML Sosy injection Commonly known as: PROLIA Inject 60 mg into the skin every 6 (six) months.   famotidine 20 MG tablet Commonly known as: PEPCID TAKE 1 TABLET BY MOUTH EVERYDAY AT BEDTIME   memantine 5 MG tablet Commonly known as: NAMENDA Take 5 mg by mouth 2 (two) times daily.   Metoprolol Tartrate 75 MG Tabs Take 50 mg by  mouth 2 (two) times daily.   pantoprazole 40 MG tablet Commonly known as: PROTONIX TAKE 1 TABLET (40 MG TOTAL) BY MOUTH DAILY. TAKE 30-60 MIN BEFORE FIRST MEAL OF THE DAY   rOPINIRole 1 MG tablet Commonly known as: REQUIP Take 1 mg by mouth every evening.   rosuvastatin 10 MG tablet Commonly known as: CRESTOR Take 10 mg by mouth at bedtime.   valsartan-hydrochlorothiazide 160-25 MG tablet Commonly known as: DIOVAN-HCT Take 1 tablet by mouth daily.   Vitamin D3 25 MCG tablet Commonly known as: Vitamin D Take 1,000 Units by mouth daily.       Review of Systems  Constitutional: Negative for activity change, appetite change and fever.  HENT: Positive for hearing loss. Negative for congestion, trouble swallowing and voice change.   Eyes: Negative for visual disturbance.  Respiratory: Negative for cough, shortness of breath and wheezing.   Cardiovascular: Negative for chest pain and leg swelling.  Gastrointestinal: Negative for abdominal pain, constipation, nausea and vomiting.  Genitourinary: Negative for dysuria, frequency and urgency.  Musculoskeletal: Positive for back pain. Negative for gait problem.  Skin: Negative for color change.  Neurological: Negative for speech difficulty, weakness, light-headedness and headaches.  Psychiatric/Behavioral: Negative for behavioral problems and sleep disturbance. The patient is not nervous/anxious.     Immunization History  Administered Date(s) Administered  . Influenza Split 04/19/2014, 04/20/2015  . Influenza Whole 04/19/2016  . Influenza,inj,Quad PF,6+ Mos 04/14/2018  . Janssen (J&J) SARS-COV-2 Vaccination 08/21/2019  . Moderna SARS-COVID-2 Vaccination 07/24/2019  . Pneumococcal Conjugate-13 08/23/2014  . Tdap 09/21/2017   Pertinent  Health Maintenance Due  Topic Date Due  . DEXA SCAN  Never done  . PNA vac Low Risk Adult (2 of 2 - PPSV23) 08/24/2015  . INFLUENZA VACCINE  02/18/2020   Fall Risk  03/24/2016  Falls in the  past year? No  Comment Emmi Telephone Survey: data to providers prior to load   Functional Status Survey:    Vitals:   04/22/20 1517  BP: (!) 172/60  Pulse: 62  Resp: 18  Temp: (!) 97 F (36.1 C)  SpO2: 90%  Weight: 98 lb (44.5 kg)  Height: 5\' 5"  (1.651 m)   Body mass index is 16.31 kg/m. Physical Exam Vitals and nursing note reviewed.  Constitutional:      Appearance: Normal appearance.     Comments: Low body weight, gradual weight loss about #3-4 Ibs in the past 3-4 months.   HENT:     Head: Normocephalic and atraumatic.     Mouth/Throat:     Mouth: Mucous membranes are  moist.  Eyes:     Extraocular Movements: Extraocular movements intact.     Conjunctiva/sclera: Conjunctivae normal.     Pupils: Pupils are equal, round, and reactive to light.  Cardiovascular:     Rate and Rhythm: Normal rate and regular rhythm.     Heart sounds: No murmur heard.   Pulmonary:     Effort: Pulmonary effort is normal.     Breath sounds: No wheezing, rhonchi or rales.  Abdominal:     General: Bowel sounds are normal.     Palpations: Abdomen is soft.     Tenderness: There is no abdominal tenderness. There is no right CVA tenderness, left CVA tenderness or guarding.  Musculoskeletal:        General: Tenderness present.     Cervical back: Normal range of motion and neck supple.     Right lower leg: No edema.     Left lower leg: No edema.     Comments: R+L SIJ tenderness palpated.   Skin:    General: Skin is warm and dry.  Neurological:     General: No focal deficit present.     Mental Status: She is alert. Mental status is at baseline.     Motor: No weakness.     Coordination: Coordination normal.     Gait: Gait abnormal.     Comments: Oriented to person, place. Furniture, rails walking sometimes.  Psychiatric:        Mood and Affect: Mood normal.        Behavior: Behavior normal.     Labs reviewed: Recent Labs    11/09/19 1441  NA 133*  K 3.8  CL 95*  CO2 29  GLUCOSE  110*  BUN 16  CREATININE 1.09*  CALCIUM 9.0   Recent Labs    11/09/19 1441  AST 28  ALT 54*  ALKPHOS 82  BILITOT 0.7  PROT 6.4*  ALBUMIN 2.8*   Recent Labs    11/09/19 1441  WBC 10.0  NEUTROABS 7.7  HGB 11.4*  HCT 36.3  MCV 91.9  PLT 299   Lab Results  Component Value Date   TSH 2.130 11/02/2019   Lab Results  Component Value Date   HGBA1C 6.3 (H) 11/02/2019   No results found for: CHOL, HDL, LDLCALC, LDLDIRECT, TRIG, CHOLHDL  Significant Diagnostic Results in last 30 days:  No results found.  Assessment/Plan Lower back pain Pain in am, aches in nature, denied urinary frequency, urgency, trauma, not positional, not traveling. Will try Tylenol qhs, may consider X-ray lumbar, sacrum/coccoxy if not better.   Restless leg syndrome Controlled, continue Requip  Vitamin D deficiency Continue Vit D supplement.   GERD (gastroesophageal reflux disease) Stable, continue Famotidine, Pantoprazole.   Mixed dementia (HCC) Dementia, takes Memantine since 12/31/19,  MRI brain: atrophy, chronic vessel changes, MMSE 18/30, underwent neurology consultation.    Essential hypertension vs Renovascular hbp HTN, occasionally elevated Sbp in 160-170s, denied headache, dizziness, change of vision, chest pain/pressure, or palpitation, takes.  Metoprolol,  Valsartan/HCT   Coronary artery disease No angina reported, continue ASA, Crestor, Metoprolol.   Osteoporosis Continue Prolia.     Family/ staff Communication: plan of care reviewed with the patient and charge nurse.   Labs/tests ordered: none  Time spend 40 minutes.

## 2020-04-22 NOTE — Assessment & Plan Note (Signed)
HTN, occasionally elevated Sbp in 160-170s, denied headache, dizziness, change of vision, chest pain/pressure, or palpitation, takes.  Metoprolol,  Valsartan/HCT

## 2020-04-22 NOTE — Assessment & Plan Note (Signed)
Pain in am, aches in nature, denied urinary frequency, urgency, trauma, not positional, not traveling. Will try Tylenol qhs, may consider X-ray lumbar, sacrum/coccoxy if not better.

## 2020-04-22 NOTE — Assessment & Plan Note (Signed)
Continue Prolia

## 2020-04-22 NOTE — Assessment & Plan Note (Signed)
Continue Vit D supplement.  

## 2020-04-22 NOTE — Assessment & Plan Note (Signed)
Stable, continue Famotidine, Pantoprazole.  

## 2020-04-22 NOTE — Assessment & Plan Note (Signed)
No angina reported, continue ASA, Crestor, Metoprolol.

## 2020-04-23 ENCOUNTER — Encounter: Payer: Self-pay | Admitting: Nurse Practitioner

## 2020-05-31 ENCOUNTER — Non-Acute Institutional Stay: Payer: Medicare Other | Admitting: Nurse Practitioner

## 2020-05-31 ENCOUNTER — Encounter: Payer: Self-pay | Admitting: Nurse Practitioner

## 2020-05-31 DIAGNOSIS — F0280 Dementia in other diseases classified elsewhere without behavioral disturbance: Secondary | ICD-10-CM | POA: Diagnosis not present

## 2020-05-31 DIAGNOSIS — M545 Low back pain, unspecified: Secondary | ICD-10-CM | POA: Diagnosis not present

## 2020-05-31 DIAGNOSIS — G2581 Restless legs syndrome: Secondary | ICD-10-CM

## 2020-05-31 DIAGNOSIS — I10 Essential (primary) hypertension: Secondary | ICD-10-CM | POA: Diagnosis not present

## 2020-05-31 DIAGNOSIS — I2510 Atherosclerotic heart disease of native coronary artery without angina pectoris: Secondary | ICD-10-CM

## 2020-05-31 DIAGNOSIS — E559 Vitamin D deficiency, unspecified: Secondary | ICD-10-CM | POA: Diagnosis not present

## 2020-05-31 DIAGNOSIS — K5901 Slow transit constipation: Secondary | ICD-10-CM | POA: Diagnosis not present

## 2020-05-31 DIAGNOSIS — F0150 Vascular dementia without behavioral disturbance: Secondary | ICD-10-CM

## 2020-05-31 DIAGNOSIS — M810 Age-related osteoporosis without current pathological fracture: Secondary | ICD-10-CM | POA: Diagnosis not present

## 2020-05-31 DIAGNOSIS — G309 Alzheimer's disease, unspecified: Secondary | ICD-10-CM

## 2020-05-31 DIAGNOSIS — K219 Gastro-esophageal reflux disease without esophagitis: Secondary | ICD-10-CM

## 2020-05-31 NOTE — Assessment & Plan Note (Signed)
RLS takes Requip in pm

## 2020-05-31 NOTE — Assessment & Plan Note (Signed)
No BM about 3-4 days, no abd pain, nausea, vomiting, or change of appetite, started Senokot S II qhs yesterday, no result, continue prn MOM, adding Bisacodyl 10mg  suppository daily prn. Encourage fruits/vegetable rich diet.

## 2020-05-31 NOTE — Assessment & Plan Note (Signed)
OP, takes Prolia 

## 2020-05-31 NOTE — Assessment & Plan Note (Signed)
Lower back pain, better with Tylenol.

## 2020-05-31 NOTE — Progress Notes (Signed)
Location:   [facility] Room Number: 15 Place of Service:  ALF (13) Provider: Arna Snipe Marylen Zuk NP  Mahlon Gammon, MD  Patient Care Team: Mahlon Gammon, MD as PCP - General (Internal Medicine) Susa Griffins, MD (Inactive) (Cardiology)  Extended Emergency Contact Information Primary Emergency Contact: Franne Forts [place] Home Phone: [phone] Relation: Friend Secondary Emergency Contact: Florian Buff [place] Home Phone: [phone] Relation: Friend  Code Status:  DNR Goals of care: Advanced Directive information Advanced Directives 03/21/2020  Does Patient Have a Medical Advance Directive? Yes  Type of Estate agent of [place];Out of facility DNR (pink MOST or yellow form)  Does patient want to make changes to medical advance directive? No - Patient declined  Copy of Healthcare Power of Attorney in Chart? Yes - validated most recent copy scanned in chart (See row information)  Would patient like information on creating a medical advance directive? -  Pre-existing out of facility DNR order (yellow form or pink MOST form) Yellow form placed in chart (order not valid for inpatient use);Pink MOST form placed in chart (order not valid for inpatient use)     Chief Complaint  Patient presents with  . Acute Visit    Constipation, elevated Sbp     HPI:  Pt is a 86 y.o. female seen today for an acute visit for constipation, no BM about 3-4 days, no abd pain, nausea, vomiting, or change of appetite, started Senokot S II qhs yesterday, no result, prn MOM available, had x1 yesterday as well. The patient stated her diet is not optimal for her constipation presently.   HTN, occasionally elevated Sbp in 160-170s, denied headache, dizziness, change of vision, chest pain/pressure, or palpitation, takes.  Metoprolol was decreased to 50mg  bid 03/21/20 in considering essential HTN vs renovascular HTN, takes  Valsartan/HCT Vit D deficiency, takes Vit D CAD, ASA, Metoprolol, Crestor RLS takes Requip in pm OP, takes Prolia Dementia, takes Memantine since 12/31/19, MRI brain: atrophy, chronic vessel changes, MMSE 18/30, underwent neurology consultation.  GERD, takes Famotidine, Pantoprazole  Lower back pain, better with Tylenol.      Past Medical History:  Diagnosis Date  . Anemia   . Carotid arterial disease (HCC)    asymptomatic   . Chest tightness   . Coronary artery disease   . High blood pressure   . Leg pain    with walking  . Palpitations    Past Surgical History:  Procedure Laterality Date  . CARDIAC CATHETERIZATION  07/15/04   noncritical CAD,80% PLA treated medically. EF% 60. no RAS.  07/17/04 CAROTID ENDARTERECTOMY     RIGHT=05/11/11, LEFT=03/30/11  . EYE SURGERY  2011   Cataract surgery Bilateral  . EYE SURGERY  2011   Bilateral eyelid surgery for ptosis  . renal artery duplex  02/23/13   ABDOMINAL AORTA:<50% DIAMETER REDUCTION. RIGHT RENAL ARTERY: 60-99% DIAMETER REDUCTION.04/25/13 LEFT RENAL ARTERY:1-59% DIAMETER REDUCTION.    Allergies  Allergen Reactions  . Cardura [Doxazosin Mesylate]     Reaction not recalled by the patient  . Lotensin [Benazepril Hcl]     Reaction not recalled by the patient  . Tramadol Nausea And Vomiting    Allergies as of 05/31/2020      Reactions   Cardura [doxazosin Mesylate]    Reaction not recalled by the patient   Lotensin [benazepril Hcl]    Reaction not recalled by the patient   Tramadol Nausea And Vomiting  Medication List       Accurate as of May 31, 2020 11:59 PM. If you have any questions, ask your nurse or doctor.        acetaminophen 325 MG tablet Commonly known as: TYLENOL Take 650 mg by mouth at bedtime.   aspirin EC 81 MG tablet Take 1 tablet (81 mg total) by mouth daily.   bisacodyl 10 MG suppository Commonly known as: DULCOLAX Place  10 mg rectally daily as needed for moderate constipation.   denosumab 60 MG/ML Sosy injection Commonly known as: PROLIA Inject 60 mg into the skin every 6 (six) months.   famotidine 20 MG tablet Commonly known as: PEPCID TAKE 1 TABLET BY MOUTH EVERYDAY AT BEDTIME   magnesium hydroxide 400 MG/5ML suspension Commonly known as: MILK OF MAGNESIA Take by mouth daily as needed for mild constipation.   memantine 5 MG tablet Commonly known as: NAMENDA Take 5 mg by mouth 2 (two) times daily.   Metoprolol Tartrate 75 MG Tabs Take 50 mg by mouth 2 (two) times daily.   pantoprazole 40 MG tablet Commonly known as: PROTONIX TAKE 1 TABLET (40 MG TOTAL) BY MOUTH DAILY. TAKE 30-60 MIN BEFORE FIRST MEAL OF THE DAY   rOPINIRole 1 MG tablet Commonly known as: REQUIP Take 1 mg by mouth every evening.   rosuvastatin 10 MG tablet Commonly known as: CRESTOR Take 10 mg by mouth at bedtime.   senna-docusate 8.6-50 MG tablet Commonly known as: Senokot-S Take 1 tablet by mouth daily.   valsartan-hydrochlorothiazide 160-25 MG tablet Commonly known as: DIOVAN-HCT Take 1 tablet by mouth daily.   Vitamin D3 25 MCG tablet Commonly known as: Vitamin D Take 1,000 Units by mouth daily.       Review of Systems  Constitutional: Negative for activity change, appetite change and fever.  HENT: Positive for hearing loss. Negative for congestion and trouble swallowing.   Eyes: Negative for visual disturbance.  Respiratory: Negative for shortness of breath.   Cardiovascular: Negative for chest pain and leg swelling.  Gastrointestinal: Negative for abdominal distention, abdominal pain, constipation, nausea and vomiting.  Genitourinary: Negative for dysuria, frequency and urgency.  Musculoskeletal: Positive for back pain and gait problem.       Uses walker.   Skin: Negative for color change.  Neurological: Negative for dizziness, speech difficulty, weakness and headaches.  Psychiatric/Behavioral:  Negative for behavioral problems and sleep disturbance. The patient is not nervous/anxious.     Immunization History  Administered Date(s) Administered  . Influenza Split 04/19/2014, 04/20/2015  . Influenza Whole 04/19/2016  . Influenza,inj,Quad PF,6+ Mos 04/14/2018  . Janssen (J&J) SARS-COV-2 Vaccination 08/21/2019  . Moderna SARS-COVID-2 Vaccination 07/24/2019  . Pneumococcal Conjugate-13 08/23/2014  . Tdap 09/21/2017   Pertinent  Health Maintenance Due  Topic Date Due  . PNA vac Low Risk Adult (2 of 2 - PPSV23) 08/24/2015  . INFLUENZA VACCINE  02/18/2020  . DEXA SCAN  Completed   Fall Risk  03/24/2016  Falls in the past year? No  Comment Emmi Telephone Survey: data to providers prior to load   Functional Status Survey:    Vitals:   05/31/20 1005  BP: (!) 160/87  Pulse: 69  Resp: (!) 22  Temp: 97.7 F (36.5 C)  SpO2: 90%  Weight: 98 lb 6.4 oz (44.6 kg)  Height: 5\' 5"  (1.651 m)   Body mass index is 16.37 kg/m. Physical Exam Vitals and nursing note reviewed.  Constitutional:      Appearance: Normal appearance.  Comments: Low body weight, gradual weight loss about #3-4 Ibs in the past 3-4 months.   HENT:     Head: Normocephalic and atraumatic.  Eyes:     Extraocular Movements: Extraocular movements intact.     Conjunctiva/sclera: Conjunctivae normal.     Pupils: Pupils are equal, round, and reactive to light.  Cardiovascular:     Rate and Rhythm: Normal rate and regular rhythm.     Heart sounds: No murmur heard.   Pulmonary:     Effort: Pulmonary effort is normal.     Breath sounds: No wheezing, rhonchi or rales.  Abdominal:     General: Bowel sounds are normal. There is no distension.     Palpations: Abdomen is soft.     Tenderness: There is no abdominal tenderness. There is no right CVA tenderness, left CVA tenderness, guarding or rebound.  Musculoskeletal:     Cervical back: Normal range of motion and neck supple.     Right lower leg: No edema.      Left lower leg: No edema.     Comments: R+L SIJ aches is better controlled on Tylenol   Skin:    General: Skin is warm and dry.  Neurological:     General: No focal deficit present.     Mental Status: She is alert. Mental status is at baseline.     Motor: No weakness.     Coordination: Coordination normal.     Gait: Gait abnormal.     Comments: Oriented to person, place. Furniture, rails walking sometimes.  Psychiatric:        Mood and Affect: Mood normal.        Behavior: Behavior normal.     Labs reviewed: Recent Labs    11/09/19 1441 04/09/20 0000  NA 133* 138  K 3.8 4.1  CL 95* 97*  CO2 29 33*  GLUCOSE 110*  --   BUN 16 25*  CREATININE 1.09* 0.9  CALCIUM 9.0 10.5   Recent Labs    11/09/19 1441 04/09/20 0000  AST 28 18  ALT 54* 22  ALKPHOS 82 70  BILITOT 0.7  --   PROT 6.4*  --   ALBUMIN 2.8* 4.1   Recent Labs    11/09/19 1441 04/09/20 0000  WBC 10.0 7.2  NEUTROABS 7.7  --   HGB 11.4* 14.3  HCT 36.3 43  MCV 91.9  --   PLT 299 198   Lab Results  Component Value Date   TSH 2.130 11/02/2019   Lab Results  Component Value Date   HGBA1C 6.3 (H) 11/02/2019   No results found for: CHOL, HDL, LDLCALC, LDLDIRECT, TRIG, CHOLHDL  Significant Diagnostic Results in last 30 days:  No results found.  Assessment/Plan Slow transit constipation No BM about 3-4 days, no abd pain, nausea, vomiting, or change of appetite, started Senokot S II qhs yesterday, no result, continue prn MOM, adding Bisacodyl 10mg  suppository daily prn. Encourage fruits/vegetable rich diet.   Essential hypertension vs Renovascular hbp HTN, occasionally elevated Sbp in 160-170s, denied headache, dizziness, change of vision, chest pain/pressure, or palpitation, takes.  Metoprolol was decreased to 50mg  bid 03/21/20 in considering essential HTN vs renovascular HTN, takes Valsartan/HCT. 06/04/20 Bp 137/62   Vitamin D deficiency Vit D deficiency, takes Vit D   CAD (coronary artery  disease) CAD, ASA, Metoprolol, Crestor  Restless leg syndrome RLS takes Requip in pm   Osteoporosis OP, takes Prolia   Mixed dementia (HCC) ementia, takes Memantine since 12/31/19, MRI  brain: atrophy, chronic vessel changes, MMSE 18/30, underwent neurology consultation.    GERD (gastroesophageal reflux disease) GERD, takes Famotidine, Pantoprazole   Lower back pain Lower back pain, better with Tylenol.      Family/ staff Communication: plan of care reviewed with the patient and charge nurse.   Labs/tests ordered:  none  Time spend 40 minutes.

## 2020-05-31 NOTE — Assessment & Plan Note (Signed)
GERD, takes Famotidine, Pantoprazole

## 2020-05-31 NOTE — Assessment & Plan Note (Signed)
ementia, takes Memantine since 12/31/19, MRI brain: atrophy, chronic vessel changes, MMSE 18/30, underwent neurology consultation.

## 2020-05-31 NOTE — Assessment & Plan Note (Signed)
CAD, ASA, Metoprolol, Crestor

## 2020-05-31 NOTE — Assessment & Plan Note (Signed)
Vit D deficiency, takes Vit D   

## 2020-05-31 NOTE — Assessment & Plan Note (Addendum)
HTN, occasionally elevated Sbp in 160-170s, denied headache, dizziness, change of vision, chest pain/pressure, or palpitation, takes.  Metoprolol was decreased to 50mg  bid 03/21/20 in considering essential HTN vs renovascular HTN, takes Valsartan/HCT. 06/04/20 Bp 137/62

## 2020-06-03 DIAGNOSIS — Z23 Encounter for immunization: Secondary | ICD-10-CM | POA: Diagnosis not present

## 2020-06-04 ENCOUNTER — Encounter: Payer: Self-pay | Admitting: Nurse Practitioner

## 2020-06-06 ENCOUNTER — Encounter: Payer: Self-pay | Admitting: Internal Medicine

## 2020-06-06 DIAGNOSIS — Z9181 History of falling: Secondary | ICD-10-CM | POA: Diagnosis not present

## 2020-06-06 DIAGNOSIS — R2681 Unsteadiness on feet: Secondary | ICD-10-CM | POA: Diagnosis not present

## 2020-06-06 DIAGNOSIS — M6281 Muscle weakness (generalized): Secondary | ICD-10-CM | POA: Diagnosis not present

## 2020-06-06 DIAGNOSIS — M25561 Pain in right knee: Secondary | ICD-10-CM | POA: Diagnosis not present

## 2020-06-06 NOTE — Progress Notes (Signed)
A user error has taken place.

## 2020-06-07 DIAGNOSIS — M25561 Pain in right knee: Secondary | ICD-10-CM | POA: Diagnosis not present

## 2020-06-07 DIAGNOSIS — Z9181 History of falling: Secondary | ICD-10-CM | POA: Diagnosis not present

## 2020-06-07 DIAGNOSIS — M6281 Muscle weakness (generalized): Secondary | ICD-10-CM | POA: Diagnosis not present

## 2020-06-07 DIAGNOSIS — R2681 Unsteadiness on feet: Secondary | ICD-10-CM | POA: Diagnosis not present

## 2020-06-11 DIAGNOSIS — M6281 Muscle weakness (generalized): Secondary | ICD-10-CM | POA: Diagnosis not present

## 2020-06-11 DIAGNOSIS — Z9181 History of falling: Secondary | ICD-10-CM | POA: Diagnosis not present

## 2020-06-11 DIAGNOSIS — M25561 Pain in right knee: Secondary | ICD-10-CM | POA: Diagnosis not present

## 2020-06-11 DIAGNOSIS — R2681 Unsteadiness on feet: Secondary | ICD-10-CM | POA: Diagnosis not present

## 2020-06-17 DIAGNOSIS — M25561 Pain in right knee: Secondary | ICD-10-CM | POA: Diagnosis not present

## 2020-06-17 DIAGNOSIS — R2681 Unsteadiness on feet: Secondary | ICD-10-CM | POA: Diagnosis not present

## 2020-06-17 DIAGNOSIS — Z9181 History of falling: Secondary | ICD-10-CM | POA: Diagnosis not present

## 2020-06-17 DIAGNOSIS — M6281 Muscle weakness (generalized): Secondary | ICD-10-CM | POA: Diagnosis not present

## 2020-06-26 ENCOUNTER — Encounter (HOSPITAL_COMMUNITY): Payer: Self-pay

## 2020-06-26 ENCOUNTER — Emergency Department (HOSPITAL_COMMUNITY): Payer: Medicare Other

## 2020-06-26 ENCOUNTER — Other Ambulatory Visit: Payer: Self-pay

## 2020-06-26 ENCOUNTER — Inpatient Hospital Stay (HOSPITAL_COMMUNITY): Payer: Medicare Other

## 2020-06-26 ENCOUNTER — Inpatient Hospital Stay (HOSPITAL_COMMUNITY)
Admission: EM | Admit: 2020-06-26 | Discharge: 2020-06-29 | DRG: 101 | Disposition: A | Discharge status: Skilled Nursing Facility | Payer: Medicare Other | Source: Skilled Nursing Facility | Attending: Family Medicine | Admitting: Family Medicine

## 2020-06-26 DIAGNOSIS — I129 Hypertensive chronic kidney disease with stage 1 through stage 4 chronic kidney disease, or unspecified chronic kidney disease: Secondary | ICD-10-CM | POA: Diagnosis present

## 2020-06-26 DIAGNOSIS — I6529 Occlusion and stenosis of unspecified carotid artery: Secondary | ICD-10-CM | POA: Diagnosis present

## 2020-06-26 DIAGNOSIS — R569 Unspecified convulsions: Secondary | ICD-10-CM | POA: Diagnosis not present

## 2020-06-26 DIAGNOSIS — R4182 Altered mental status, unspecified: Secondary | ICD-10-CM

## 2020-06-26 DIAGNOSIS — Z79899 Other long term (current) drug therapy: Secondary | ICD-10-CM | POA: Diagnosis not present

## 2020-06-26 DIAGNOSIS — E876 Hypokalemia: Secondary | ICD-10-CM | POA: Diagnosis present

## 2020-06-26 DIAGNOSIS — F0150 Vascular dementia without behavioral disturbance: Secondary | ICD-10-CM | POA: Diagnosis not present

## 2020-06-26 DIAGNOSIS — I7300 Raynaud's syndrome without gangrene: Secondary | ICD-10-CM | POA: Diagnosis present

## 2020-06-26 DIAGNOSIS — F0390 Unspecified dementia without behavioral disturbance: Secondary | ICD-10-CM | POA: Diagnosis present

## 2020-06-26 DIAGNOSIS — F0280 Dementia in other diseases classified elsewhere without behavioral disturbance: Secondary | ICD-10-CM | POA: Diagnosis not present

## 2020-06-26 DIAGNOSIS — I2510 Atherosclerotic heart disease of native coronary artery without angina pectoris: Secondary | ICD-10-CM | POA: Diagnosis present

## 2020-06-26 DIAGNOSIS — G4089 Other seizures: Secondary | ICD-10-CM | POA: Diagnosis present

## 2020-06-26 DIAGNOSIS — R1310 Dysphagia, unspecified: Secondary | ICD-10-CM | POA: Diagnosis present

## 2020-06-26 DIAGNOSIS — N1831 Chronic kidney disease, stage 3a: Secondary | ICD-10-CM | POA: Diagnosis present

## 2020-06-26 DIAGNOSIS — E785 Hyperlipidemia, unspecified: Secondary | ICD-10-CM | POA: Diagnosis present

## 2020-06-26 DIAGNOSIS — R4789 Other speech disturbances: Secondary | ICD-10-CM | POA: Diagnosis not present

## 2020-06-26 DIAGNOSIS — Z7401 Bed confinement status: Secondary | ICD-10-CM | POA: Diagnosis not present

## 2020-06-26 DIAGNOSIS — E778 Other disorders of glycoprotein metabolism: Secondary | ICD-10-CM | POA: Diagnosis present

## 2020-06-26 DIAGNOSIS — Z7982 Long term (current) use of aspirin: Secondary | ICD-10-CM | POA: Diagnosis not present

## 2020-06-26 DIAGNOSIS — Z66 Do not resuscitate: Secondary | ICD-10-CM | POA: Diagnosis present

## 2020-06-26 DIAGNOSIS — J8410 Pulmonary fibrosis, unspecified: Secondary | ICD-10-CM | POA: Diagnosis present

## 2020-06-26 DIAGNOSIS — J90 Pleural effusion, not elsewhere classified: Secondary | ICD-10-CM | POA: Diagnosis not present

## 2020-06-26 DIAGNOSIS — G309 Alzheimer's disease, unspecified: Secondary | ICD-10-CM | POA: Diagnosis present

## 2020-06-26 DIAGNOSIS — R404 Transient alteration of awareness: Secondary | ICD-10-CM | POA: Diagnosis not present

## 2020-06-26 DIAGNOSIS — M6281 Muscle weakness (generalized): Secondary | ICD-10-CM | POA: Diagnosis not present

## 2020-06-26 DIAGNOSIS — R55 Syncope and collapse: Secondary | ICD-10-CM | POA: Diagnosis not present

## 2020-06-26 DIAGNOSIS — M4312 Spondylolisthesis, cervical region: Secondary | ICD-10-CM | POA: Diagnosis not present

## 2020-06-26 DIAGNOSIS — G2581 Restless legs syndrome: Secondary | ICD-10-CM | POA: Diagnosis present

## 2020-06-26 DIAGNOSIS — E871 Hypo-osmolality and hyponatremia: Secondary | ICD-10-CM | POA: Diagnosis present

## 2020-06-26 DIAGNOSIS — E559 Vitamin D deficiency, unspecified: Secondary | ICD-10-CM | POA: Diagnosis not present

## 2020-06-26 DIAGNOSIS — M545 Low back pain, unspecified: Secondary | ICD-10-CM | POA: Diagnosis not present

## 2020-06-26 DIAGNOSIS — W19XXXA Unspecified fall, initial encounter: Secondary | ICD-10-CM | POA: Diagnosis not present

## 2020-06-26 DIAGNOSIS — D72828 Other elevated white blood cell count: Secondary | ICD-10-CM | POA: Diagnosis present

## 2020-06-26 DIAGNOSIS — R410 Disorientation, unspecified: Secondary | ICD-10-CM | POA: Diagnosis not present

## 2020-06-26 DIAGNOSIS — E162 Hypoglycemia, unspecified: Secondary | ICD-10-CM | POA: Diagnosis present

## 2020-06-26 DIAGNOSIS — R2681 Unsteadiness on feet: Secondary | ICD-10-CM | POA: Diagnosis not present

## 2020-06-26 DIAGNOSIS — M47812 Spondylosis without myelopathy or radiculopathy, cervical region: Secondary | ICD-10-CM | POA: Diagnosis not present

## 2020-06-26 DIAGNOSIS — Z9181 History of falling: Secondary | ICD-10-CM | POA: Diagnosis not present

## 2020-06-26 DIAGNOSIS — R9431 Abnormal electrocardiogram [ECG] [EKG]: Secondary | ICD-10-CM | POA: Diagnosis not present

## 2020-06-26 DIAGNOSIS — K219 Gastro-esophageal reflux disease without esophagitis: Secondary | ICD-10-CM | POA: Diagnosis not present

## 2020-06-26 DIAGNOSIS — M2550 Pain in unspecified joint: Secondary | ICD-10-CM | POA: Diagnosis not present

## 2020-06-26 DIAGNOSIS — I150 Renovascular hypertension: Secondary | ICD-10-CM | POA: Diagnosis present

## 2020-06-26 DIAGNOSIS — Z20822 Contact with and (suspected) exposure to covid-19: Secondary | ICD-10-CM | POA: Diagnosis present

## 2020-06-26 DIAGNOSIS — R0902 Hypoxemia: Secondary | ICD-10-CM | POA: Diagnosis not present

## 2020-06-26 DIAGNOSIS — K5901 Slow transit constipation: Secondary | ICD-10-CM | POA: Diagnosis not present

## 2020-06-26 DIAGNOSIS — M810 Age-related osteoporosis without current pathological fracture: Secondary | ICD-10-CM | POA: Diagnosis not present

## 2020-06-26 DIAGNOSIS — N183 Chronic kidney disease, stage 3 unspecified: Secondary | ICD-10-CM | POA: Diagnosis present

## 2020-06-26 DIAGNOSIS — I10 Essential (primary) hypertension: Secondary | ICD-10-CM | POA: Diagnosis not present

## 2020-06-26 LAB — COMPREHENSIVE METABOLIC PANEL
ALT: 20 U/L (ref 0–44)
AST: 23 U/L (ref 15–41)
Albumin: 3.9 g/dL (ref 3.5–5.0)
Alkaline Phosphatase: 60 U/L (ref 38–126)
Anion gap: 12 (ref 5–15)
BUN: 21 mg/dL (ref 8–23)
CO2: 29 mmol/L (ref 22–32)
Calcium: 10 mg/dL (ref 8.9–10.3)
Chloride: 93 mmol/L — ABNORMAL LOW (ref 98–111)
Creatinine, Ser: 0.89 mg/dL (ref 0.44–1.00)
GFR, Estimated: >60 mL/min (ref >60)
Glucose, Bld: 130 mg/dL — ABNORMAL HIGH (ref 70–99)
Potassium: 3.9 mmol/L (ref 3.5–5.1)
Sodium: 134 mmol/L — ABNORMAL LOW (ref 135–145)
Total Bilirubin: 0.8 mg/dL (ref 0.3–1.2)
Total Protein: 6.5 g/dL (ref 6.5–8.1)

## 2020-06-26 LAB — CBC
HCT: 42.4 % (ref 36.0–46.0)
Hemoglobin: 14.1 g/dL (ref 12.0–15.0)
MCH: 31.3 pg (ref 26.0–34.0)
MCHC: 33.3 g/dL (ref 30.0–36.0)
MCV: 94.2 fL (ref 80.0–100.0)
Platelets: 193 10*3/uL (ref 150–400)
RBC: 4.5 MIL/uL (ref 3.87–5.11)
RDW: 13.3 % (ref 11.5–15.5)
WBC: 12.6 10*3/uL — ABNORMAL HIGH (ref 4.0–10.5)
nRBC: 0 % (ref 0.0–0.2)

## 2020-06-26 LAB — URINALYSIS, ROUTINE W REFLEX MICROSCOPIC
Bacteria, UA: NONE SEEN
Bilirubin Urine: NEGATIVE
Glucose, UA: NEGATIVE mg/dL
Ketones, ur: 5 mg/dL — AB
Nitrite: NEGATIVE
Protein, ur: NEGATIVE mg/dL
Specific Gravity, Urine: 1.012 (ref 1.005–1.030)
pH: 6 (ref 5.0–8.0)

## 2020-06-26 LAB — DIFFERENTIAL
Abs Immature Granulocytes: 0.05 10*3/uL (ref 0.00–0.07)
Basophils Absolute: 0 10*3/uL (ref 0.0–0.1)
Basophils Relative: 0 %
Eosinophils Absolute: 0.1 10*3/uL (ref 0.0–0.5)
Eosinophils Relative: 0 %
Immature Granulocytes: 0 %
Lymphocytes Relative: 6 %
Lymphs Abs: 0.7 10*3/uL (ref 0.7–4.0)
Monocytes Absolute: 0.4 10*3/uL (ref 0.1–1.0)
Monocytes Relative: 4 %
Neutro Abs: 11.2 10*3/uL — ABNORMAL HIGH (ref 1.7–7.7)
Neutrophils Relative %: 90 %

## 2020-06-26 LAB — PROTIME-INR
INR: 1 (ref 0.8–1.2)
Prothrombin Time: 13 seconds (ref 11.4–15.2)

## 2020-06-26 LAB — RESP PANEL BY RT-PCR (FLU A&B, COVID) ARPGX2
Influenza A by PCR: NEGATIVE
Influenza B by PCR: NEGATIVE
SARS Coronavirus 2 by RT PCR: NEGATIVE

## 2020-06-26 LAB — APTT: aPTT: 24 seconds (ref 24–36)

## 2020-06-26 LAB — ETHANOL: Alcohol, Ethyl (B): <10 mg/dL (ref <10)

## 2020-06-26 MED ORDER — ROPINIROLE HCL 1 MG PO TABS
1.0000 mg | ORAL_TABLET | Freq: Every evening | ORAL | Status: DC
  Administered 2020-06-27 – 2020-06-28 (×2): 1 mg via ORAL
  Filled 2020-06-26 (×3): qty 1

## 2020-06-26 MED ORDER — FAMOTIDINE 20 MG PO TABS
20.0000 mg | ORAL_TABLET | Freq: Every day | ORAL | Status: DC
  Administered 2020-06-27 – 2020-06-28 (×2): 20 mg via ORAL
  Filled 2020-06-26 (×2): qty 1

## 2020-06-26 MED ORDER — SODIUM CHLORIDE 0.9 % IV SOLN
100.0000 mL/h | INTRAVENOUS | Status: DC
  Administered 2020-06-26 – 2020-06-28 (×6): 100 mL/h via INTRAVENOUS

## 2020-06-26 MED ORDER — ASPIRIN EC 81 MG PO TBEC
81.0000 mg | DELAYED_RELEASE_TABLET | Freq: Every day | ORAL | Status: DC
  Administered 2020-06-27 – 2020-06-28 (×2): 81 mg via ORAL
  Filled 2020-06-26 (×2): qty 1

## 2020-06-26 MED ORDER — SENNOSIDES-DOCUSATE SODIUM 8.6-50 MG PO TABS
1.0000 | ORAL_TABLET | Freq: Every day | ORAL | Status: DC
  Administered 2020-06-28: 1 via ORAL
  Filled 2020-06-26: qty 1

## 2020-06-26 MED ORDER — LORAZEPAM 2 MG/ML IJ SOLN: 1.0000 mg | INTRAMUSCULAR | Status: DC | PRN

## 2020-06-26 MED ORDER — SODIUM CHLORIDE 0.9 % IV BOLUS
500.0000 mL | Freq: Once | INTRAVENOUS | Status: AC
  Administered 2020-06-26: 500 mL via INTRAVENOUS

## 2020-06-26 MED ORDER — ONDANSETRON HCL 4 MG/2ML IJ SOLN: 4.0000 mg | Freq: Four times a day (QID) | INTRAMUSCULAR | Status: DC | PRN

## 2020-06-26 MED ORDER — ENOXAPARIN SODIUM 40 MG/0.4ML ~~LOC~~ SOLN
40.0000 mg | SUBCUTANEOUS | Status: DC
  Administered 2020-06-27 – 2020-06-28 (×2): 40 mg via SUBCUTANEOUS
  Filled 2020-06-26 (×2): qty 0.4

## 2020-06-26 MED ORDER — ROSUVASTATIN CALCIUM 5 MG PO TABS
10.0000 mg | ORAL_TABLET | Freq: Every day | ORAL | Status: DC
  Administered 2020-06-27 – 2020-06-28 (×2): 10 mg via ORAL
  Filled 2020-06-26 (×2): qty 2

## 2020-06-26 MED ORDER — PANTOPRAZOLE SODIUM 40 MG PO TBEC
40.0000 mg | DELAYED_RELEASE_TABLET | Freq: Every day | ORAL | Status: DC
  Administered 2020-06-27 – 2020-06-28 (×2): 40 mg via ORAL
  Filled 2020-06-26 (×2): qty 1

## 2020-06-26 MED ORDER — ACETAMINOPHEN 325 MG PO TABS: 650.0000 mg | ORAL_TABLET | ORAL | Status: DC | PRN

## 2020-06-26 MED ORDER — ACETAMINOPHEN 650 MG RE SUPP: 650.0000 mg | RECTAL | Status: DC | PRN

## 2020-06-26 MED ORDER — ONDANSETRON HCL 4 MG PO TABS: 4.0000 mg | ORAL_TABLET | Freq: Four times a day (QID) | ORAL | Status: DC | PRN

## 2020-06-26 MED ORDER — BISACODYL 10 MG RE SUPP: 10.0000 mg | Freq: Every day | RECTAL | Status: DC | PRN

## 2020-06-26 MED ORDER — MEMANTINE HCL 10 MG PO TABS
5.0000 mg | ORAL_TABLET | Freq: Two times a day (BID) | ORAL | Status: DC
  Administered 2020-06-27 – 2020-06-28 (×4): 5 mg via ORAL
  Filled 2020-06-26 (×5): qty 1

## 2020-06-26 MED ORDER — LORAZEPAM 2 MG/ML IJ SOLN
1.0000 mg | Freq: Once | INTRAMUSCULAR | Status: AC
  Administered 2020-06-26: 1 mg via INTRAVENOUS
  Filled 2020-06-26: qty 1

## 2020-06-26 NOTE — H&P (Signed)
History and Physical    DEEANNE Campbell WLS:[id number] DOB: 1927-01-21 DOA: 06/26/2020  PCP: Mahlon Gammon, MD Consultants:  Frances Furbish - neurology; Allyson Sabal - cardiology Patient coming from: [facility]; NOK: Lynnell Grain [phone]   Chief Complaint:  Possible seizure  HPI: [name] is a 86 y.o. female with medical history significant of HTN; carotid disease; HLD; dementia; and CAD presenting with seizure-like activity.  She was sedated/obtunded at the time of my evaluation and unable to answer questions.  I spoke with her son, Smitty Cords (lives in [place], requests daily updates).  He denies h/o prior seizures.  She had an event in Feb-march, possibly a mild stroke.  She woke up and didn't know who/where she was.  Slurred speech following and another incident after.  This improved but she has had some continued and progressive cognitive impairment since.  MRI in May was negative.  Also early with early stage dementia.   ED Course:  Concern for new-onset seizures.  At baseline, ambulates, communicates.  LKW last evening.  Found down, unresponsive, ?10 minute seizure.  EMS also concerned about this and still upon arrival in the ER.   Head CT negative.  Not back to baseline.  Given Ativan.  Neurology to consult.  Review of Systems: Unable to perform.   Ambulatory Status:  Ambulates with walker  COVID Vaccine Status:  Complete plus booster  Past Medical History:  Diagnosis Date  . Anemia   . Carotid arterial disease (HCC)    asymptomatic   . Chest tightness   . Coronary artery disease   . High blood pressure   . Leg pain    with walking  . Palpitations     Past Surgical History:  Procedure Laterality Date  . CARDIAC CATHETERIZATION  07/15/04   noncritical CAD,80% PLA treated medically. EF% 60. no RAS.  [name] CAROTID ENDARTERECTOMY     RIGHT=05/11/11, LEFT=03/30/11  . EYE SURGERY  2011   Cataract surgery Bilateral  . EYE SURGERY  2011   Bilateral eyelid surgery for  ptosis  . renal artery duplex  02/23/13   ABDOMINAL AORTA:<50% DIAMETER REDUCTION. RIGHT RENAL ARTERY: 60-99% DIAMETER REDUCTION.[name] LEFT RENAL ARTERY:1-59% DIAMETER REDUCTION.    Social History   Socioeconomic History  . Marital status: Widowed    Spouse name: Not on file  . Number of children: Not on file  . Years of education: Not on file  . Highest education level: Not on file  Occupational History  . Not on file  Tobacco Use  . Smoking status: Never Smoker  . Smokeless tobacco: Never Used  Substance and Sexual Activity  . Alcohol use: No  . Drug use: No  . Sexual activity: Not on file  Other Topics Concern  . Not on file  Social History Narrative  . Not on file   Social Determinants of Health   Financial Resource Strain:   . Difficulty of Paying Living Expenses: Not on file  Food Insecurity:   . Worried About Programme researcher, broadcasting/film/video in the Last Year: Not on file  . Ran Out of Food in the Last Year: Not on file  Transportation Needs:   . Lack of Transportation (Medical): Not on file  . Lack of Transportation (Non-Medical): Not on file  Physical Activity:   . Days of Exercise per Week: Not on file  . Minutes of Exercise per Session: Not on file  Stress:   . Feeling of Stress : Not on file  Social  Connections:   . Frequency of Communication with Friends and Family: Not on file  . Frequency of Social Gatherings with Friends and Family: Not on file  . Attends Religious Services: Not on file  . Active Member of Clubs or Organizations: Not on file  . Attends BankerClub or Organization Meetings: Not on file  . Marital Status: Not on file  Intimate Partner Violence:   . Fear of Current or Ex-Partner: Not on file  . Emotionally Abused: Not on file  . Physically Abused: Not on file  . Sexually Abused: Not on file    Allergies  Allergen Reactions  . Cardura [Doxazosin Mesylate] Other (See Comments)    Reaction not recalled by the patient  . Lotensin [Benazepril Hcl] Other (See  Comments)    Reaction not recalled by the patient  . Tramadol Nausea And Vomiting    Family History  Problem Relation Age of Onset  . Lung disease Mother   . Heart disease Father   . Cancer Sister        liver cancer  . Heart disease Brother     Prior to Admission medications   Medication Sig Start Date End Date Taking? Authorizing Provider  acetaminophen (TYLENOL) 325 MG tablet Take 650 mg by mouth at bedtime.    [provider]  aspirin EC 81 MG tablet Take 1 tablet (81 mg total) by mouth daily. 04/18/15   Croitoru, Mihai, MD  bisacodyl (DULCOLAX) 10 MG suppository Place 10 mg rectally daily as needed for moderate constipation.    [provider]  famotidine (PEPCID) 20 MG tablet TAKE 1 TABLET BY MOUTH EVERYDAY AT BEDTIME 11/15/17   Nyoka CowdenWert, Michael B, MD  memantine (NAMENDA) 5 MG tablet Take 5 mg by mouth 2 (two) times daily.    [provider]  Metoprolol Tartrate 75 MG TABS Take 50 mg by mouth 2 (two) times daily.  03/07/14   [provider]  pantoprazole (PROTONIX) 40 MG tablet TAKE 1 TABLET (40 MG TOTAL) BY MOUTH DAILY. TAKE 30-60 MIN BEFORE FIRST MEAL OF THE DAY 11/09/17   Nyoka CowdenWert, Michael B, MD  rOPINIRole (REQUIP) 1 MG tablet Take 1 mg by mouth every evening.     [provider]  rosuvastatin (CRESTOR) 10 MG tablet Take 10 mg by mouth at bedtime.     [provider]  senna-docusate (SENOKOT-S) 8.6-50 MG tablet Take 1 tablet by mouth daily.    [provider]  valsartan-hydrochlorothiazide (DIOVAN-HCT) 160-25 MG tablet Take 1 tablet by mouth daily.    [provider]  Vitamin D3 (VITAMIN D) 25 MCG tablet Take 1,000 Units by mouth daily.    [provider]    Physical Exam: Vitals:   06/26/20 1145 06/26/20 1322 06/26/20 1415 06/26/20 1430  BP: 118/81 131/89 (!) 138/92 125/85  Pulse: 95 (!) 106 (!) 101   Resp: 20 (!) 24 19 19   SpO2: 92% 92% 96%      . General:  Appears somnolent/sedated, responds  only to pain . Eyes:   normal lids . ENT:  grossly normal lips & tongue, limited evaluation with no obvious tongue trauma . Neck:  no LAD, masses or thyromegaly . Cardiovascular:  RRR, no m/r/g. No LE edema.  [name]. Respiratory:   CTA bilaterally with no wheezes/rales/rhonchi.  Normal respiratory effort. . Abdomen:  soft, NT, ND, NABS . Skin:  no rash or induration seen on limited exam; R maxillary bruise noted . Musculoskeletal:  no bony abnormality . Psychiatric:  Obtunded/sedated, responds only to painful stimuli . Neurologic:  Unable to perform    Radiological Exams on Admission: Independently reviewed - see discussion in A/P where applicable  EEG  Result Date: 06/26/2020 Lindsay Quest, MD     06/26/2020 12:29 PM Patient Name: [name] MRN: [id number] Epilepsy Attending: Charlsie Campbell Referring Physician/Provider: Dr. Jonah Blue Date: 06/26/1020 Duration: 25.23 mins Patient history: 86 year old female with new onset seizure-like episode.  EEG to evaluate for seizures. Level of alertness: Awake AEDs during EEG study: Versed Technical aspects: This EEG study was done with scalp electrodes positioned according to the 10-20 International system of electrode placement. Electrical activity was acquired at a sampling rate of 500Hz  and reviewed with a high frequency filter of 70Hz  and a low frequency filter of 1Hz . EEG data were recorded continuously and digitally stored. Description: No posterior dominant rhythm was seen. EEG showed continuous generalized 3 to 6 Hz theta-delta slowing. Hyperventilation and photic stimulation were not performed.   Of note, EEG was technically difficult due to significant myogenic electrode artifact. ABNORMALITY -Continuous slow, generalized IMPRESSION: This technically difficult study is suggestive of moderate diffuse encephalopathy, nonspecific etiology but could be secondary to postictal state.  No seizures or epileptiform discharges were seen  throughout the recording.   CT HEAD WO CONTRAST  Result Date: 06/26/2020 CLINICAL DATA:  Found unresponsive, seizure activity EXAM: CT HEAD WITHOUT CONTRAST TECHNIQUE: Contiguous axial images were obtained from the base of the skull through the vertex without intravenous contrast. COMPARISON:  11/09/2019 FINDINGS: Brain: There is no acute intracranial hemorrhage, mass effect, or edema. Gray-white differentiation is preserved. There is no extra-axial fluid collection. Prominence of the ventricles and sulci reflects stable parenchymal volume loss. Patchy hypoattenuation in the supratentorial white matter is nonspecific but probably reflects stable chronic microvascular ischemic changes. Vascular: There is atherosclerotic calcification at the skull base. Skull: Calvarium is unremarkable. Sinuses/Orbits: Polypoid right maxillary sinus mucosal thickening. Other: None. IMPRESSION: No acute intracranial abnormality. Stable chronic/nonemergent findings detailed above. Electronically Signed   By: [name] M.D.   On: 06/26/2020 08:19   CT Cervical Spine Wo Contrast  Result Date: 06/26/2020 CLINICAL DATA:  Found unresponsive, seizure activity EXAM: CT CERVICAL SPINE WITHOUT CONTRAST TECHNIQUE: Multidetector CT imaging of the cervical spine was performed without intravenous contrast. Multiplanar CT image reconstructions were also generated. COMPARISON:  None. FINDINGS: Alignment: Trace retrolisthesis at C5-C6. Skull base and vertebrae: No acute cervical spine fracture. Vertebral body heights are maintained. Soft tissues and spinal canal: No prevertebral fluid or swelling. No visible canal hematoma. Disc levels: Degenerative changes are present greatest at C5-C6 and C6-C7. Upper chest: Scarring at the lung apices. Other: Calcified plaque at the common carotid bifurcations. IMPRESSION: No acute cervical spine fracture. Electronically Signed   By: Guadlupe Spanish M.D.   On: 06/26/2020 08:23   DG  Chest Portable 1 View  Result Date: 06/26/2020 CLINICAL DATA:  Altered mental status. EXAM: PORTABLE CHEST 1 VIEW COMPARISON:  November 16, 2016. FINDINGS: The heart size and mediastinal contours are within normal limits. No pneumothorax or pleural effusion is noted. Stable interstitial densities are noted throughout both lungs most consistent with chronic interstitial lung disease or fibrosis, but acute superimposed inflammation or edema cannot be excluded. The visualized skeletal structures are unremarkable. IMPRESSION: Stable interstitial densities are noted throughout both lungs most consistent with chronic interstitial lung disease or fibrosis, but acute superimposed inflammation or edema cannot be excluded. Aortic Atherosclerosis (ICD10-I70.0). Electronically Signed  By: Lupita Raider M.D.   On: 06/26/2020 10:53    EKG: Independently reviewed.  NSR with rate 89; nonspecific ST changes with no evidence of acute ischemia   Labs on Admission: I have personally reviewed the available labs and imaging studies at the time of the admission.  Pertinent labs:   Glucose 130 WBC 12.6 ETOH <10 INR 1.0   Assessment/Plan Principal Problem:   New onset seizure without head trauma (HCC) Active Problems:   Carotid stenosis   CAD (coronary artery disease)   Hyperlipidemia   Postinflammatory pulmonary fibrosis (HCC)   Renovascular hypertension   CKD (chronic kidney disease) stage 3, GFR 30-59 ml/min (HCC)   Mixed dementia (HCC)   Seizure -Patient without known h/o seizures presenting with concern for prolonged seizure  -She does have known dementia -Negative head CT -EEG was concerning for moderate diffuse encephalopathy without apparent seizure activity  -Patient admitted for further evaluation -Neurology has seen the patient -Holding antiepileptic medications for now -Will continue to monitor for recurrent seizure activity -Seizure precautions -Ativan prn  CAD/PVD -Continue  ASA  HTN -Low BP while in the ER so hold medications for now - including lopressor and Diovan-HCT  HLD -Continue Crestor  Pulmonary fibrosis -Not apparently on medications or home O2 -CXR with what appears to be chronic changes -Will monitor for now  Stage 3 CKD -Stable compared to baseline at this time  Dementia -Continue Namenda -Unable to assess currently but based on son's description she has had progressive decline -Consider palliative care consultation  DNR -The patient would not desire resuscitation and would prefer to die a natural death should that situation arise. Doylene Canning DNR form is present with MOST form requesting limited interventions, no feeding tube.    Note: This patient has been tested and is pending for the novel coronavirus COVID-19. The patient has been fully vaccinated against COVID-19.    DVT prophylaxis:  Lovenox  Code Status:  DNR - paperwork is at bedside Family Communication: None present; I spoke with the patient's son by telephone at the time of admission. Disposition Plan:  The patient is from: SNF  Anticipated d/c is to: SNF  Anticipated d/c date will depend on clinical response to treatment, but likely 1-2 days depending on if she has recurrent seizure activity  Patient is currently: acutely ill Consults called: Neurology Admission status:  Admit - It is my clinical opinion that admission to INPATIENT is reasonable and necessary because of the expectation that this patient will require hospital care that crosses at least 2 midnights to treat this condition based on the medical complexity of the problems presented.  Given the aforementioned information, the predictability of an adverse outcome is felt to be significant.    Jonah Blue MD [facility]   How to contact the [facility] Attending or Consulting provider 7A - 7P or covering provider during after hours 7P -7A, for this patient?  1. Check the care team in [facility] and look for a)  attending/consulting [facility] provider listed and b) the [facility] team listed 2. Log into [email or web address] and use [place]'s universal password to access. If you do not have the password, please contact the hospital operator. 3. Locate the [facility] provider you are looking for under [facility] and page to a number that you can be directly reached. 4. If you still have difficulty reaching the provider, please page the [facility] (Director on Call) for the Hospitalists listed on amion for assistance.   06/26/2020, 3:15 PM

## 2020-06-26 NOTE — ED Notes (Signed)
Pure wick has been placed. Suction set to 45mmHg.  

## 2020-06-26 NOTE — ED Notes (Signed)
Pt transferred to hospital bed. Pt continually attempting to get out of bed and taking cords off. Pt reminded to leave cords alone. Pt states she doesn't understand. Pt cleaned and new brief applied. Pt moving all extremities, NAD noted.

## 2020-06-26 NOTE — ED Notes (Signed)
Industrial/product designer from [employer] updated. Pt's son, Smitty Cords also updated

## 2020-06-26 NOTE — ED Triage Notes (Signed)
Pt BIB [facility] from [facility] for seizure like activity. Per 3rd shift staff pt had an episode approx 10 pm last night, during rounds pt found unresponsive by staff. EMS noted pt having focal seizure activity with a Left side gaze. Pt given 5mg  Versed IM en route

## 2020-06-26 NOTE — ED Provider Notes (Signed)
[facility] EMERGENCY DEPARTMENT Provider Note   CSN: [id number] Arrival date & time: 06/26/20  0720     History Chief complaint: Altered mental status, possible seizure  [name] is a 86 y.o. female.  HPI   Patient presents to the ED for evaluation of possible seizure-like activity.  Patient resides at a nursing facility.  According to the EMS report normally she is able to ambulate with a walker and communicate.  This morning the patient appeared to have fallen and was found on the floor.  Staff at the facility saw her shaking for several minutes.  They were concerned she was having a seizure.  Patient has not been able to respond or communicate.  EMS was contacted.  During transport they also witnessed seizure-like activity primarily consisting of her whole body tensing up and some mild shaking.  Patient has remained unresponsive since that time.  She does have a DNR and MOST form signed.    Past Medical History:  Diagnosis Date  . Anemia   . Carotid arterial disease (HCC)    asymptomatic   . Chest tightness   . Coronary artery disease   . High blood pressure   . Leg pain    with walking  . Palpitations     Patient Active Problem List   Diagnosis Date Noted  . Slow transit constipation 05/31/2020  . Lower back pain 04/22/2020  . Vitamin D deficiency 02/23/2020  . Restless leg syndrome 02/23/2020  . Mixed dementia (HCC) 02/23/2020  . Osteoporosis 02/23/2020  . GERD (gastroesophageal reflux disease) 02/23/2020  . CKD (chronic kidney disease) stage 3, GFR 30-59 ml/min (HCC) 12/29/2019  . Palpitations   . Chest tightness   . Coronary artery disease   . Carotid arterial disease (HCC)   . Renovascular hypertension 04/17/2016  . Essential hypertension vs Renovascular hbp 12/26/2014  . Postinflammatory pulmonary fibrosis (HCC) 12/25/2014  . Tachycardia 12/13/2014  . Aftercare following surgery of the circulatory system, NEC 11/28/2013  . CAD  (coronary artery disease) 09/21/2013  . Right renal artery stenosis (HCC) 09/21/2013  . Hyperlipidemia 09/21/2013  . First degree AV block 09/21/2013  . Carotid stenosis 11/24/2011    Past Surgical History:  Procedure Laterality Date  . CARDIAC CATHETERIZATION  07/15/04   noncritical CAD,80% PLA treated medically. EF% 60. no RAS.  [name] CAROTID ENDARTERECTOMY     RIGHT=05/11/11, LEFT=03/30/11  . EYE SURGERY  2011   Cataract surgery Bilateral  . EYE SURGERY  2011   Bilateral eyelid surgery for ptosis  . renal artery duplex  02/23/13   ABDOMINAL AORTA:<50% DIAMETER REDUCTION. RIGHT RENAL ARTERY: 60-99% DIAMETER REDUCTION.[name] LEFT RENAL ARTERY:1-59% DIAMETER REDUCTION.     OB History   No obstetric history on file.     Family History  Problem Relation Age of Onset  . Lung disease Mother   . Heart disease Father   . Cancer Sister        liver cancer  . Heart disease Brother     Social History   Tobacco Use  . Smoking status: Never Smoker  . Smokeless tobacco: Never Used  Substance Use Topics  . Alcohol use: No  . Drug use: No    Home Medications Prior to Admission medications   Medication Sig Start Date End Date Taking? Authorizing Provider  acetaminophen (TYLENOL) 325 MG tablet Take 650 mg by mouth at bedtime.    [provider]  aspirin EC 81 MG tablet Take 1 tablet (81  mg total) by mouth daily. 04/18/15   Croitoru, Mihai, MD  bisacodyl (DULCOLAX) 10 MG suppository Place 10 mg rectally daily as needed for moderate constipation.    [provider]  famotidine (PEPCID) 20 MG tablet TAKE 1 TABLET BY MOUTH EVERYDAY AT BEDTIME 11/15/17   Nyoka Cowden, MD  memantine (NAMENDA) 5 MG tablet Take 5 mg by mouth 2 (two) times daily.    [provider]  Metoprolol Tartrate 75 MG TABS Take 50 mg by mouth 2 (two) times daily.  03/07/14   [provider]  pantoprazole (PROTONIX) 40 MG tablet TAKE 1 TABLET (40 MG TOTAL) BY MOUTH DAILY. TAKE 30-60 MIN  BEFORE FIRST MEAL OF THE DAY 11/09/17   Nyoka Cowden, MD  rOPINIRole (REQUIP) 1 MG tablet Take 1 mg by mouth every evening.     [provider]  rosuvastatin (CRESTOR) 10 MG tablet Take 10 mg by mouth at bedtime.     [provider]  senna-docusate (SENOKOT-S) 8.6-50 MG tablet Take 1 tablet by mouth daily.    [provider]  valsartan-hydrochlorothiazide (DIOVAN-HCT) 160-25 MG tablet Take 1 tablet by mouth daily.    [provider]  Vitamin D3 (VITAMIN D) 25 MCG tablet Take 1,000 Units by mouth daily.    [provider]    Allergies    Cardura [doxazosin mesylate], Lotensin [benazepril hcl], and Tramadol  Review of Systems   Review of Systems  Unable to perform ROS: Mental status change    Physical Exam Updated Vital Signs BP 92/73   Pulse 95   Resp 19   SpO2 90%   Physical Exam Vitals and nursing note reviewed.  Constitutional:      Appearance: She is well-developed. She is ill-appearing. She is not diaphoretic.     Comments: Elderly, frail  HENT:     Head: Normocephalic and atraumatic.     Right Ear: External ear normal.     Left Ear: External ear normal.  Eyes:     General: No scleral icterus.       Right eye: No discharge.        Left eye: No discharge.     Conjunctiva/sclera: Conjunctivae normal.  Neck:     Trachea: No tracheal deviation.  Cardiovascular:     Rate and Rhythm: Normal rate and regular rhythm.  Pulmonary:     Effort: Pulmonary effort is normal. No respiratory distress.     Breath sounds: Normal breath sounds. No stridor. No wheezing or rales.  Abdominal:     General: Bowel sounds are normal. There is no distension.     Palpations: Abdomen is soft.     Tenderness: There is no abdominal tenderness. There is no guarding or rebound.  Musculoskeletal:        General: No tenderness.     Cervical back: Neck supple.  Skin:    General: Skin is warm and dry.     Findings: No rash.  Neurological:      Cranial Nerves: No cranial nerve deficit (Patient is nonverbal, no obvious facial droop noted).     Motor: Tremor and abnormal muscle tone present.     Coordination: Coordination normal.     Comments: Some movement noted of the right upper extremity with right arm flexed, left arm and lower extremities appear more extended, increased tone throughout, mild tremor noted, gaze appears to be deviated to the left     ED Results / Procedures / Treatments   Labs (  all labs ordered are listed, but only abnormal results are displayed) Labs Reviewed  CBC - Abnormal; Notable for the following components:      Result Value   WBC 12.6 (*)    All other components within normal limits  DIFFERENTIAL - Abnormal; Notable for the following components:   Neutro Abs 11.2 (*)    All other components within normal limits  COMPREHENSIVE METABOLIC PANEL - Abnormal; Notable for the following components:   Sodium 134 (*)    Chloride 93 (*)    Glucose, Bld 130 (*)    All other components within normal limits  ETHANOL  PROTIME-INR  APTT  RAPID URINE DRUG SCREEN, HOSP PERFORMED  URINALYSIS, ROUTINE W REFLEX MICROSCOPIC    EKG EKG Interpretation  Date/Time:  Wednesday June 26 2020 07:29:17 EST Ventricular Rate:  89 PR Interval:    QRS Duration: 116 QT Interval:  383 QTC Calculation: 466 R Axis:   -89 Text Interpretation: Sinus rhythm Prolonged PR interval Left atrial enlargement LAD, consider left anterior fascicular block Anterior infarct, old Since last tracing rate faster Confirmed by Linwood DibblesKnapp, Zonya Gudger [phone]([id number]) on 06/26/2020 8:03:30 AM   Radiology CT HEAD WO CONTRAST  Result Date: 06/26/2020 CLINICAL DATA:  Found unresponsive, seizure activity EXAM: CT HEAD WITHOUT CONTRAST TECHNIQUE: Contiguous axial images were obtained from the base of the skull through the vertex without intravenous contrast. COMPARISON:  11/09/2019 FINDINGS: Brain: There is no acute intracranial hemorrhage, mass effect, or edema.  Gray-white differentiation is preserved. There is no extra-axial fluid collection. Prominence of the ventricles and sulci reflects stable parenchymal volume loss. Patchy hypoattenuation in the supratentorial white matter is nonspecific but probably reflects stable chronic microvascular ischemic changes. Vascular: There is atherosclerotic calcification at the skull base. Skull: Calvarium is unremarkable. Sinuses/Orbits: Polypoid right maxillary sinus mucosal thickening. Other: None. IMPRESSION: No acute intracranial abnormality. Stable chronic/nonemergent findings detailed above. Electronically Signed   By: Guadlupe SpanishPraneil  Patel M.D.   On: 06/26/2020 08:19   CT Cervical Spine Wo Contrast  Result Date: 06/26/2020 CLINICAL DATA:  Found unresponsive, seizure activity EXAM: CT CERVICAL SPINE WITHOUT CONTRAST TECHNIQUE: Multidetector CT imaging of the cervical spine was performed without intravenous contrast. Multiplanar CT image reconstructions were also generated. COMPARISON:  None. FINDINGS: Alignment: Trace retrolisthesis at C5-C6. Skull base and vertebrae: No acute cervical spine fracture. Vertebral body heights are maintained. Soft tissues and spinal canal: No prevertebral fluid or swelling. No visible canal hematoma. Disc levels: Degenerative changes are present greatest at C5-C6 and C6-C7. Upper chest: Scarring at the lung apices. Other: Calcified plaque at the common carotid bifurcations. IMPRESSION: No acute cervical spine fracture. Electronically Signed   By: Guadlupe SpanishPraneil  Patel M.D.   On: 06/26/2020 08:23    Procedures Procedures (including critical care time)  Medications Ordered in ED Medications  sodium chloride 0.9 % bolus 500 mL (0 mLs Intravenous Stopped 06/26/20 0946)    Followed by  0.9 %  sodium chloride infusion (100 mL/hr Intravenous New Bag/Given 06/26/20 0947)  LORazepam (ATIVAN) injection 1 mg (1 mg Intravenous Given 06/26/20 [id number])    ED Course  I have reviewed the triage vital signs and the  nursing notes.  Pertinent labs & imaging results that were available during my care of the patient were reviewed by me and considered in my medical decision making (see chart for details).  Clinical Course as of Jun 27 1023  Wed Jun 26, 2020  0919 Head CT without acute findings   [JK]  0919 C-spine CT without  acute findings   [JK]  0932 No seizure activity noted.  Vitals remained stable.  Mental status still diminished   [JK]    Clinical Course User Index [JK] Linwood Dibbles, MD   MDM Rules/Calculators/A&P                         Patient presents with altered mental status.  Possible seizure activity, status.  Presentation is also concerning for posturing, ?cerbral hemorrhage/edema, stroke.  Will proceed with labs imaging.  DNR, will hold off on airway intervention.   Vitals stable on presentation  Head CT and C-spine CT without acute findings. No significant laboratory abnormalities noted. White blood cell count elevated but no findings to suggest infection.  Patient presented with altered mental status and probable seizures. Last time known well unknown. Patient CT scan does not show any evidence of cerebral hemorrhage. No definite stroke. Patient does not appear to be having any seizure activity at this time but she has not returned to baseline. Persistent change in mental status may be related to postictal state. Will need to monitor to make sure she does return to baseline, not having subclinical seizure, occult cva.  Discussed with Dr. Amada Jupiter, neurology who will evaluate the patient. I will also consult the medical service for admission Final Clinical Impression(s) / ED Diagnoses Final diagnoses:  Seizure-like activity [facility])      Linwood Dibbles, MD 06/26/20 1028

## 2020-06-26 NOTE — Consult Note (Addendum)
Neurology Consultation Reason for Consult: Seizure Referring Physician: Dr. Iantha Fallen  CC: Seizure  History is obtained from: Chart review as patient is confused  HPI: [name] is a 86 y.o. female with history of hypertension, hyperlipidemia, coronary artery disease, mitral valve prolapse, restless leg syndrome, osteoarthritis, osteopenia, bilateral carotid endarterectomies who was brought in this morning after a seizure-like episode.  Patient is confused and unable to provide any history.  Per review of ED notes, patient stays at [facility].  She was found unresponsive by the staff around 10 PM last night.  EMS was called and patient was noted to have seizure-like activity as well as left gaze deviation.  Patient was given IM Versed 5 mg and brought to [facility] ED. during transport, EMS also noticed seizure-like activity described as full body tensing up and mild shaking.  Neurology was consulted for further management.   Of note, patient was seen by [facility] on 11/05/2019 for dementia work-up on 11/02/1019.  ROS: Unable to obtain due to altered mental status.   Past Medical History:  Diagnosis Date  . Anemia   . Carotid arterial disease (HCC)    asymptomatic   . Chest tightness   . Coronary artery disease   . High blood pressure   . Leg pain    with walking  . Palpitations     Family History  Problem Relation Age of Onset  . Lung disease Mother   . Heart disease Father   . Cancer Sister        liver cancer  . Heart disease Brother    Social History: Per chart review, she has never smoked. She has never used smokeless tobacco. She reports that she does not drink alcohol and does not use drugs.  Exam: Current vital signs: BP 92/73   Pulse 95   Resp 19   SpO2 90%  Vital signs in last 24 hours: Pulse Rate:  [86-97] 95 (12/08 1000) Resp:  [14-23] 19 (12/08 1000) BP: (92-121)/(67-88) 92/73 (12/08 0945) SpO2:  [87 %-100 %] 90 % (12/08  1000)   Physical Exam  Constitutional: Laying in bed, not in apparent distress Psych: Unable to assess secondary to altered mental status Eyes: No scleral injection HENT: No OP obstrucion Head: Normocephalic.  Cardiovascular: Normal rate and regular rhythm.  Respiratory: Effort normal, non-labored breathing GI: Soft.  No distension. There is no tenderness.  Skin: Warm, no apparent ulcers  Neuro: Lethargic, moans to noxious stimuli, pupils equally round and reactive, no forced gaze deviation, no apparent facial asymmetry, withdraws all 4 extremities to noxious stimuli, increased tone in all 4 extremities, no rhythmic jerking noted  I have reviewed labs in epic and the results pertinent to this consultation are: WBC 12.6, sodium 134, urine analysis pending  I have reviewed the images obtained: CT head without contrast no acute abnormality. MRI brain without contrast 12/01/2019: No acute abnormality  ASSESSMENT/PLAN: 86 year old female with possible dementia admitted after a seizure-like episode.  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 -Leukocytosis likely secondary to seizure.  Urine analysis pending -Etiology for seizures: Possibly provoked seizure in the setting of infection, epilepsy due to underlying dementia  Recommendations: -We will obtain stat EEG to rule out nonconvulsive status epilepticus -Patient has had MRI brain without contrast in May 2021  without any acute abnormality.  Low suspicion for infection like meningitis, therefore will defer lumbar puncture at this point. -Urine analysis ordered and pending to look for possible provoked seizure -The description of the episode sounds very concerning for focal seizure (due to  left gaze deviation).  Given patient's age, other comorbidities, if EEG does not show any potential epileptogenicity, I would prefer to hold off starting any AEDs unless patient has another seizure, more than 24 hours apart. -However, if patient has another seizure, will consider starting patient on Keppra to 50 mg twice daily -Seizure precautions As needed IV Ativan 1 mg for clinical seizure-like activity lasting more than 5 minutes.  Please be cautious with Ativan use as patient is DNR. -Management of rest of comorbidities per primary team  Thank you for allowing me to participate in the care of this patient.  Neurology will follow.  Please call us for any further questions.   Lindie Spruce Epilepsy [facility]

## 2020-06-26 NOTE — Progress Notes (Signed)
STAT EEG complete - results pending. ? ?

## 2020-06-26 NOTE — Procedures (Signed)
Patient Name: [name]  MRN: [id number]  Epilepsy Attending: Charlsie Quest  Referring Physician/Provider: Dr. Jonah Blue Date: 06/26/1020 Duration: 25.23 mins  Patient history: 86 year old female with new onset seizure-like episode.  EEG to evaluate for seizures.  Level of alertness: Awake  AEDs during EEG study: Versed  Technical aspects: This EEG study was done with scalp electrodes positioned according to the 10-20 International system of electrode placement. Electrical activity was acquired at a sampling rate of 500Hz  and reviewed with a high frequency filter of 70Hz  and a low frequency filter of 1Hz . EEG data were recorded continuously and digitally stored.   Description: No posterior dominant rhythm was seen. EEG showed continuous generalized 3 to 6 Hz theta-delta slowing. Hyperventilation and photic stimulation were not performed.     Of note, EEG was technically difficult due to significant myogenic electrode artifact.  ABNORMALITY -Continuous slow, generalized  IMPRESSION: This technically difficult study is suggestive of moderate diffuse encephalopathy, nonspecific etiology but could be secondary to postictal state.  No seizures or epileptiform discharges were seen throughout the recording.  [name] 

## 2020-06-27 DIAGNOSIS — F0280 Dementia in other diseases classified elsewhere without behavioral disturbance: Secondary | ICD-10-CM

## 2020-06-27 DIAGNOSIS — G309 Alzheimer's disease, unspecified: Secondary | ICD-10-CM

## 2020-06-27 DIAGNOSIS — F0150 Vascular dementia without behavioral disturbance: Secondary | ICD-10-CM

## 2020-06-27 LAB — GLUCOSE, RANDOM: Glucose, Bld: 291 mg/dL — ABNORMAL HIGH (ref 70–99)

## 2020-06-27 LAB — GLUCOSE, CAPILLARY
Glucose-Capillary: 72 mg/dL (ref 70–99)
Glucose-Capillary: 47 mg/dL — ABNORMAL LOW (ref 70–99)
Glucose-Capillary: 53 mg/dL — ABNORMAL LOW (ref 70–99)
Glucose-Capillary: 62 mg/dL — ABNORMAL LOW (ref 70–99)
Glucose-Capillary: 166 mg/dL — ABNORMAL HIGH (ref 70–99)
Glucose-Capillary: 41 mg/dL — CL (ref 70–99)

## 2020-06-27 LAB — CBC
HCT: 40.3 % (ref 36.0–46.0)
Hemoglobin: 14.2 g/dL (ref 12.0–15.0)
MCH: 31.9 pg (ref 26.0–34.0)
MCHC: 35.2 g/dL (ref 30.0–36.0)
MCV: 90.6 fL (ref 80.0–100.0)
Platelets: 155 K/uL (ref 150–400)
RBC: 4.45 MIL/uL (ref 3.87–5.11)
RDW: 13.1 % (ref 11.5–15.5)
WBC: 10.7 K/uL — ABNORMAL HIGH (ref 4.0–10.5)
nRBC: 0 % (ref 0.0–0.2)

## 2020-06-27 LAB — COMPREHENSIVE METABOLIC PANEL WITH GFR
ALT: 20 U/L (ref 0–44)
AST: 35 U/L (ref 15–41)
Albumin: 3.5 g/dL (ref 3.5–5.0)
Alkaline Phosphatase: 56 U/L (ref 38–126)
Anion gap: 15 (ref 5–15)
BUN: 13 mg/dL (ref 8–23)
CO2: 25 mmol/L (ref 22–32)
Calcium: 9.5 mg/dL (ref 8.9–10.3)
Chloride: 95 mmol/L — ABNORMAL LOW (ref 98–111)
Creatinine, Ser: 0.85 mg/dL (ref 0.44–1.00)
GFR, Estimated: >60 mL/min
Glucose, Bld: 92 mg/dL (ref 70–99)
Potassium: 3 mmol/L — ABNORMAL LOW (ref 3.5–5.1)
Sodium: 135 mmol/L (ref 135–145)
Total Bilirubin: 1.5 mg/dL — ABNORMAL HIGH (ref 0.3–1.2)
Total Protein: 6.1 g/dL — ABNORMAL LOW (ref 6.5–8.1)

## 2020-06-27 MED ORDER — POTASSIUM CHLORIDE 10 MEQ/100ML IV SOLN
10.0000 meq | INTRAVENOUS | Status: AC
  Administered 2020-06-27 (×4): 10 meq via INTRAVENOUS
  Filled 2020-06-27 (×3): qty 100

## 2020-06-27 MED ORDER — DEXTROSE 50 % IV SOLN
INTRAVENOUS | Status: AC
  Administered 2020-06-27: 50 mL
  Filled 2020-06-27: qty 50

## 2020-06-27 NOTE — Progress Notes (Signed)
Subjective: No further seizures overnight.  Patient states she does not remember the episode yesterday and why she is in the hospital.  ROS: negative except above  Examination  Vital signs in last 24 hours: Temp:  [98 F (36.7 C)-98.3 F (36.8 C)] 98.3 F (36.8 C) (12/09 0734) Pulse Rate:  [51-109] 80 (12/09 0734) Resp:  [14-29] 16 (12/09 0734) BP: (90-160)/(42-148) 113/79 (12/09 0734) SpO2:  [89 %-100 %] 94 % (12/09 0734)  General: lying in bed, not in apparent distress  CVS: pulse-normal rate and rhythm RS: breathing comfortably Extremities: normal   Neuro: MS: Alert, oriented to self, place: [facility]), time: February, follows commands, able to tell me 2+4 is equal to 6, able to tell me there is poor for presented all CN: pupils equal and reactive,  EOMI, face symmetric, tongue midline, normal sensation over face, Motor: 4/5 strength in all 4 extremities  Basic Metabolic Panel: Recent Labs  Lab 06/26/20 0830 06/27/20 0413  NA 134* 135  K 3.9 3.0*  CL 93* 95*  CO2 29 25  GLUCOSE 130* 92  BUN 21 13  CREATININE 0.89 0.85  CALCIUM 10.0 9.5    CBC: Recent Labs  Lab 06/26/20 0830 06/27/20 0413  WBC 12.6* 10.7*  NEUTROABS 11.2*  --   HGB 14.1 14.2  HCT 42.4 40.3  MCV 94.2 90.6  PLT 193 155     Coagulation Studies: Recent Labs    06/26/20 0830  LABPROT 13.0  INR 1.0    Imaging no new brain imaging overnight    ASSESSMENT AND PLAN: 86 year old female with possible dementia admitted after a seizure-like episode.  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 -EEG did not show any interictal abnormality -Patient has had MRI brain without contrast in May 2021  without any acute abnormality.  -Did not have UTI, any other source of infection, no electrolyte abnormalities to suggest provoked seizure at this point  Recommendations: -The description of the episode sounds concerning for focal seizure due to left gaze deviation.   Discussed with patient's son that patient likely had a single unprovoked likely focal seizure in the setting of dementia and therefore could be a candidate for antiseizure medications. However, given her age and comorbidities with no evidence of epileptogenicity on EEG, it might be reasonable to hold off adding new medications at this point.  - Hpwever, if patient has another seizure, we can consider starting patient on Keppra 250 mg twice daily -Seizure precautions  -management of rest of comorbidities per primary team -follow-up with neurology in 6 to 8 weeks after discharge  I have spent a total of  35 minutes with the patient reviewing hospital notes,  test results, labs and examining the patient as well as establishing an assessment and plan that was discussed personally with the patient's son on phone.  > 50% of time was spent in direct patient care.    Lindie Spruce Epilepsy [facility] For questions after 5pm please refer to AMION to reach the Neurologist on call

## 2020-06-27 NOTE — Plan of Care (Signed)
  Problem: Clinical Measurements: Goal: Diagnostic test results will improve Outcome: Not Progressing   Problem: Safety: Goal: Verbalization of understanding the information provided will improve Outcome: Not Progressing   Problem: Nutrition: Goal: Adequate nutrition will be maintained Outcome: Not Progressing   Problem: Safety: Goal: Ability to remain free from injury will improve Outcome: Not Progressing

## 2020-06-27 NOTE — Evaluation (Signed)
Clinical/Bedside Swallow Evaluation Patient Details  Name: [name] MRN: [id number] Date of Birth: 09/24/1926  Today's Date: 06/27/2020 Time: SLP Start Time (ACUTE ONLY): 1343 SLP Stop Time (ACUTE ONLY): 1410 SLP Time Calculation (min) (ACUTE ONLY): 27 min  Past Medical History:  Past Medical History:  Diagnosis Date  . Anemia   . Carotid arterial disease (HCC)    asymptomatic   . Chest tightness   . Coronary artery disease   . High blood pressure   . Leg pain    with walking  . Palpitations    Past Surgical History:  Past Surgical History:  Procedure Laterality Date  . CARDIAC CATHETERIZATION  07/15/04   noncritical CAD,80% PLA treated medically. EF% 60. no RAS.  [name] CAROTID ENDARTERECTOMY     RIGHT=05/11/11, LEFT=03/30/11  . EYE SURGERY  2011   Cataract surgery Bilateral  . EYE SURGERY  2011   Bilateral eyelid surgery for ptosis  . renal artery duplex  02/23/13   ABDOMINAL AORTA:<50% DIAMETER REDUCTION. RIGHT RENAL ARTERY: 60-99% DIAMETER REDUCTION.[name] LEFT RENAL ARTERY:1-59% DIAMETER REDUCTION.   HPI:  [name] is a 86 y.o. female with medical history significant of HTN; carotid disease; HLD; dementia; and CAD presenting with seizure-like activity. Pt with waxing and waning mentation   Assessment / Plan / Recommendation Clinical Impression  Pt presents with a mild cognitive based dysphagia. Pt was sleeping upon SLP arrival however awoke with cueing. Per nursing pt with waxing and waning mentation. Pt exhibited reduced labial seal with thin liquids via cup, suspected to be due to reduced cogntive sequencing. Pt with adequate labial closure with use of thins via straw. Prolonged oral transit with brief oral holding noted with all POs. Pt responsive to verbal cues to improve swallow sequencing. Prolonged mastication of solids exhibited. No overt s/sx of aspiration exhibited. Recommend dysphagia 3 (mechanical soft) and thin liquids with medicines as tolerated (whole  in puree if difficulty). Recommend full supervision with all POs with pt hx of cognitive deficits.  SLP to follow up for diet tolerance.  SLP Visit Diagnosis: Dysphagia, unspecified (R13.10)    Aspiration Risk  Mild aspiration risk    Diet Recommendation   Dysphagia 3 (mechanical soft) thin liquids   Medication Administration: Whole meds with puree (as tolerated)    Other  Recommendations Oral Care Recommendations: Oral care BID   Follow up Recommendations 24 hour supervision/assistance      Frequency and Duration min 1 x/week  1 week       Prognosis Prognosis for Safe Diet Advancement: Good Barriers to Reach Goals: Cognitive deficits      Swallow Study   General Date of Onset: 06/26/20 HPI: [name] is a 86 y.o. female with medical history significant of HTN; carotid disease; HLD; dementia; and CAD presenting with seizure-like activity. Pt with waxing and waning mentation Type of Study: Bedside Swallow Evaluation Previous Swallow Assessment: none on file Diet Prior to this Study: NPO Temperature Spikes Noted: No Respiratory Status: Room air History of Recent Intubation: No Behavior/Cognition: Distractible;Requires cueing;Alert;Pleasant mood Oral Cavity Assessment: Dry Oral Care Completed by SLP: Yes Oral Cavity - Dentition: Adequate natural dentition Vision: Functional for self-feeding Patient Positioning: Upright in bed Baseline Vocal Quality: Low vocal intensity Volitional Swallow: Unable to elicit    Oral/Motor/Sensory Function Overall Oral Motor/Sensory Function: Generalized oral weakness   Ice Chips Ice chips: Impaired Presentation: Spoon Oral Phase Impairments: Impaired mastication Oral Phase Functional Implications: Prolonged oral transit;Oral holding Pharyngeal Phase Impairments: Suspected  delayed Swallow;Multiple swallows;Decreased hyoid-laryngeal movement   Thin Liquid Thin Liquid: Impaired Presentation: Cup;Straw Oral Phase Impairments:  Reduced labial seal Oral Phase Functional Implications: Prolonged oral transit Pharyngeal  Phase Impairments: Suspected delayed Swallow;Multiple swallows    Nectar Thick Nectar Thick Liquid: Not tested   Honey Thick Honey Thick Liquid: Not tested   Puree Puree: Impaired Presentation: Spoon Oral Phase Impairments: Reduced labial seal Oral Phase Functional Implications: Prolonged oral transit Pharyngeal Phase Impairments: Suspected delayed Swallow;Multiple swallows   Solid     Solid: Impaired Presentation: Self Fed Oral Phase Impairments: Impaired mastication;Reduced lingual movement/coordination Oral Phase Functional Implications: Prolonged oral transit;Oral residue Pharyngeal Phase Impairments: Suspected delayed Swallow;Multiple swallows      Donabelle Molden E Lynnox Girten MA, CCC-SLP Acute Rehabilitation Services 06/27/2020,2:25 PM

## 2020-06-27 NOTE — Progress Notes (Signed)
PROGRESS NOTE  [name] DJM:[id number] DOB: 19-Feb-1927 DOA: 06/26/2020 PCP: Lindsay Gammon, MD  Brief History   86 year old woman PMH including hyperlipidemia, hypertension presented with seizure-like episode with left gaze deviation, confusion after being found unresponsive by staff at nursing facility.  Admitted for further evaluation of suspected postictal state.  Seen by neurology, felt to have had likely a focal seizure unprovoked in the setting of dementia, given age and comorbidities, no evidence of apical adjacent density on EEG, neurology recommended against AED.      A & P  Seizure, focal, unprovoked --no recurrent seizure --no AED per neurology --If patient has another seizure, consider starting Keppra 250 mg twice daily.   --Seizure precautions. --follow-up w/ neurology in 6-8 weeks  Acute encephalopathy  --appears to be resolving --CT head negative.  EEG suggestive of moderate diffuse encephalopathy, nonspecific but could be postictal in nature.  No seizures or epileptiform discharges noted. --MRI brain from May of this year without acute abnormality, no signs or symptoms to suggest infection and therefore lumbar puncture was deferred.  No signs or symptoms of infection.  Hypoglycemia w/o DM --no meds given to cause --probably from poor oral intake --treat and follow  Dementia --stable, Continue Namenda  CAD, PVD --stable, continue ASA  Pulmonary fibrosis --stable  Disposition Plan:  Discussion: In regard to seizure and mental status this has improved and patient is stable, however patient with recurrent hypoglycemia precluding discharge at this time  Status is: Inpatient  Remains inpatient appropriate because:Inpatient level of care appropriate due to severity of illness   Dispo: The patient is from: SNF              Anticipated d/c is to: SNF              Anticipated d/c date is: 1 day              Patient currently is not medically stable to  d/c.  DVT prophylaxis: enoxaparin (LOVENOX) injection 40 mg Start: 06/26/20 1145   Code Status: DNR Family Communication: updated son [name] by telephone  Lindsay Sacks, MD  [facility] Direct contact: see [email or web address] (further directions at bottom of note if needed) 7PM-7AM contact night coverage as at bottom of note 06/27/2020, 4:43 PM  LOS: 1 day     Consults:  . Neurology    Procedures:  .   Significant Diagnostic Tests:  .    Interval History/Subjective  CC: f/u seizure  Feels better, no specific complaints  Objective   Vitals:  Vitals:   06/27/20 1109 06/27/20 1545  BP: 97/65 110/80  Pulse: 88 78  Resp: 18 16  Temp: 97.6 F (36.4 C) 98 F (36.7 C)  SpO2: 92% 96%    Exam:  Constitutional:   . Appears calm and comfortable ENMT:  . grossly normal hearing  Respiratory:  . CTA bilaterally, no w/r/r.  . Respiratory effort norma Cardiovascular:  . RRR, no m/r/g . No LE extremity edema   Musculoskeletal:  . RUE, LUE, RLE, LLE   . Moves all extremities to command Psychiatric:  . Mental status o Mood, affect appropriate . Confused, disoriented   I have personally reviewed the following:   Today's Data  . 2 CBG lows . K+ 3.0 . Remainder CMP unremarkable . WBC down to 10.7  Scheduled Meds: . aspirin EC  81 mg Oral Daily  . enoxaparin (LOVENOX) injection  40 mg Subcutaneous Q24H  . famotidine  20 mg Oral  QHS  . memantine  5 mg Oral BID  . pantoprazole  40 mg Oral Daily  . rOPINIRole  1 mg Oral QPM  . rosuvastatin  10 mg Oral QHS  . senna-docusate  1 tablet Oral Daily   Continuous Infusions: . sodium chloride 100 mL/hr (06/27/20 0720)  . potassium chloride 10 mEq (06/27/20 1615)    Principal Problem:   New onset seizure without head trauma (HCC) Active Problems:   Carotid stenosis   CAD (coronary artery disease)   Hyperlipidemia   Postinflammatory pulmonary fibrosis (HCC)   Renovascular hypertension   CKD (chronic  kidney disease) stage 3, GFR 30-59 ml/min (HCC)   Mixed dementia (HCC)   LOS: 1 day   How to contact the [facility] Attending or Consulting provider 7A - 7P or covering provider during after hours 7P -7A, for this patient?  1. Check the care team in [facility] and look for a) attending/consulting TR[facility] provider listed and b) the [facility] team listed 2. Log into [email or web address] and use [facility] universal password to access. If you do not have the password, please contact the hospital operator. 3. Locate the [facility] provider you are looking for under [facility] and page to a number that you can be directly reached. 4. If you still have difficulty reaching the provider, please page the [facility] (Director on Call) for the Hospitalists listed on amion for assistance.

## 2020-06-27 NOTE — Progress Notes (Signed)
Was called to room because pt was lethargic and difficult to arouse.  BS was 24 and she was given an amp of d50.  Her recheck was in the 40s, treated with carbs then recheck in the 60s then treated with carbs and recheck was in the 50s. Then more d50 and recheck error of 16, then in the 30s and 40s. Then called MD to say her BS are going down instead of up, with all of the treatment.  Since the first treatment, she has been laughing and talking and said she feels fine.  Her fingers are bluish and cold, as has been the case since admit.  When sats attempted on her fingers, she was satting in the 60s and 70s then on ear it was 98%.  Dr did come up and agreed with results may not be accurate because of the cold, bluish fingers.  Recheck on her left thumb, which was the pinkest and warmest, and MD did rub her hands with friction.  Recheck then was 166.  She had a total of 9 small grape juices and some of her meal and d50 twice.  She said she is buzzing now and just wants to be left alone.  She remains talkative and said she feels fine.

## 2020-06-27 NOTE — Hospital Course (Addendum)
86 year old woman PMH including hyperlipidemia, hypertension presented with seizure-like episode with left gaze deviation, confusion after being found unresponsive by staff at nursing facility.  Admitted for further evaluation of suspected postictal state.  Seen by neurology, felt to have had likely a focal seizure unprovoked in the setting of dementia, given age and comorbidities, nonspecific EEG, neurology recommended against AED.  Therapy recommended SNF.  Stable for discharge.

## 2020-06-28 LAB — RESP PANEL BY RT-PCR (FLU A&B, COVID) ARPGX2
Influenza A by PCR: NEGATIVE
Influenza B by PCR: NEGATIVE
SARS Coronavirus 2 by RT PCR: NEGATIVE

## 2020-06-28 LAB — GLUCOSE, CAPILLARY
Glucose-Capillary: 24 mg/dL — CL (ref 70–99)
Glucose-Capillary: 31 mg/dL — CL (ref 70–99)
Glucose-Capillary: 50 mg/dL — ABNORMAL LOW (ref 70–99)
Glucose-Capillary: 71 mg/dL (ref 70–99)

## 2020-06-28 NOTE — Progress Notes (Signed)
Patient had blood sugar checked of 41, rechecked and it was 50, patient was alert and talking spoke to MD and gave her juice, and MD agreed reading was artifact and not accurate. Said to warm her hands and recheck and if it was above 70 to discontinue blood sugar checks. Rechecked and reading was 71. She is currently eating her breakfast and has family at the bedside.

## 2020-06-28 NOTE — Evaluation (Signed)
Occupational Therapy Evaluation Patient Details Name: [name] MRN: [id number] DOB: 03-Mar-1927 Today's Date: 06/28/2020    History of Present Illness Pt is a 86 year old female who presented with prolonged seizure-like activity. EEG concerning for moderate diffuse encephalopathy without apparent seizure activity. Suspecting single unprovoked likely focal seizure in the setting of dementia. CT of head and cervical spine were negative. PMH: early stage dementia, palpitations, leg pain, HTN, CAD, chest tightness, carotid arterial disease, and anemia.   Clinical Impression   Patient evaluated by Occupational Therapy with no further acute OT needs identified. All education has been completed and the patient has no further questions. See below for any follow-up Occupational Therapy or equipment needs. Defer all other OT to next venue at [facility].  OT to sign off. Thank you for referral.      Follow Up Recommendations  SNF;Other (comment) (return to facility into the rehab area)    Equipment Recommendations  3 in 1 bedside commode;Other (comment) (RW)    Recommendations for Other Services       Precautions / Restrictions Precautions Precautions: Fall      Mobility Bed Mobility               General bed mobility comments: oob on arrival    Transfers Overall transfer level: Needs assistance   Transfers: Sit to/from Stand Sit to Stand: Min assist Stand pivot transfers: Min assist       General transfer comment: pt with RW used completed sit<S>tand x5 during session. pt needs mod cues for safety with lines attached    Balance Overall balance assessment: Needs assistance                                         ADL either performed or assessed with clinical judgement   ADL Overall ADL's : Needs assistance/impaired Eating/Feeding: Modified independent   Grooming: Supervision/safety;Sitting   Upper Body Bathing: Supervision/  safety;Sitting   Lower Body Bathing: Minimal assistance;Sit to/from stand           Toilet Transfer: Minimal assistance;RW                   Vision   Vision Assessment?: No apparent visual deficits     Perception     Praxis      Pertinent Vitals/Pain Pain Assessment: No/denies pain     Hand Dominance Right   Extremity/Trunk Assessment Upper Extremity Assessment Upper Extremity Assessment: Overall WFL for tasks assessed   Lower Extremity Assessment Lower Extremity Assessment: Defer to PT evaluation   Cervical / Trunk Assessment Cervical / Trunk Assessment: Kyphotic   Communication Communication Communication: No difficulties   Cognition Arousal/Alertness: Awake/alert Behavior During Therapy: WFL for tasks assessed/performed Overall Cognitive Status: Impaired/Different from baseline Area of Impairment: Orientation;Attention;Memory;Following commands;Safety/judgement;Awareness;Problem solving                 Orientation Level: Disoriented to;Time;Situation Current Attention Level: Sustained Memory: Decreased short-term memory Following Commands: Follows one step commands consistently;Follows one step commands with increased time Safety/Judgement: Decreased awareness of safety;Decreased awareness of deficits Awareness: Emergent Problem Solving: Slow processing;Difficulty sequencing;Requires verbal cues General Comments: pt unable to recall name of facility and when give choice able to pick out facility. pt very fixated on d/c ride. pt asking several times if RN has number to [facility]. Pt educated by RN that son lives out of state  and pt states "oh yeah" pt educaetd that ambulance will take [facility] and states okay and stops asking   General Comments       Exercises     Shoulder Instructions      Home Living Family/patient expects to be discharged to:: Skilled nursing facility                                 Additional  Comments: [facility]      Prior Functioning/Environment Level of Independence: Independent with assistive device(s);Needs assistance  Gait / Transfers Assistance Needed: Mod I with use of rollator for all mobility ADL's / Homemaking Assistance Needed: Independent except min guard for showers as needed            OT Problem List:        OT Treatment/Interventions:      OT Goals(Current goals can be found in the care plan section) Acute Rehab OT Goals Patient Stated Goal: to go home OT Goal Formulation: Patient unable to participate in goal setting Potential to Achieve Goals: Good  OT Frequency:     Barriers to D/C:            Co-evaluation              AM-PAC OT "6 Clicks" Daily Activity     Outcome Measure Help from another person eating meals?: A Little Help from another person taking care of personal grooming?: A Little Help from another person toileting, which includes using toliet, bedpan, or urinal?: A Little Help from another person bathing (including washing, rinsing, drying)?: A Little Help from another person to put on and taking off regular upper body clothing?: A Little Help from another person to put on and taking off regular lower body clothing?: A Little 6 Click Score: 18   End of Session Equipment Utilized During Treatment: Rolling walker Nurse Communication: Mobility status;Precautions  Activity Tolerance: Patient tolerated treatment well Patient left: in chair;with call bell/phone within reach;with chair alarm set  OT Visit Diagnosis: Unsteadiness on feet (R26.81)                Time: 1497-0263 OT Time Calculation (min): 24 min Charges:  OT General Charges $OT Visit: 1 Visit OT Evaluation $OT Eval Moderate Complexity: 1 Mod   Brynn, OTR/L  Acute Rehabilitation Services Pager: [phone] Office: [phone] .   Mateo Flow 06/28/2020, 4:44 PM

## 2020-06-28 NOTE — NC FL2 (Signed)
[place] MEDICAID FL2 LEVEL OF CARE SCREENING TOOL     IDENTIFICATION  Patient Name: [name] Birthdate: February 27, 1927 Sex: female Admission Date (Current Location): 06/26/2020  [facility] and [place] Number:  Producer, television/film/video and Address:  [facility], [address], [place], [place] [address]      Provider Number: [id number]  Attending Physician Name and Address:  Standley Brooking, MD  Relative Name and Phone Number:  [name] [phone]    Current Level of Care: Hospital Recommended Level of Care: Skilled Nursing Facility Prior Approval Number:    Date Approved/Denied:   PASRR Number: [id number]  Discharge Plan: SNF    Current Diagnoses: Patient Active Problem List   Diagnosis Date Noted  . New onset seizure without head trauma (HCC) 06/26/2020  . Slow transit constipation 05/31/2020  . Lower back pain 04/22/2020  . Vitamin D deficiency 02/23/2020  . Restless leg syndrome 02/23/2020  . Mixed dementia (HCC) 02/23/2020  . Osteoporosis 02/23/2020  . GERD (gastroesophageal reflux disease) 02/23/2020  . CKD (chronic kidney disease) stage 3, GFR 30-59 ml/min (HCC) 12/29/2019  . Palpitations   . Chest tightness   . Coronary artery disease   . Carotid arterial disease (HCC)   . Renovascular hypertension 04/17/2016  . Essential hypertension vs Renovascular hbp 12/26/2014  . Postinflammatory pulmonary fibrosis (HCC) 12/25/2014  . Tachycardia 12/13/2014  . Aftercare following surgery of the circulatory system, NEC 11/28/2013  . CAD (coronary artery disease) 09/21/2013  . Right renal artery stenosis (HCC) 09/21/2013  . Hyperlipidemia 09/21/2013  . First degree AV block 09/21/2013  . Carotid stenosis 11/24/2011    Orientation RESPIRATION BLADDER Height & Weight     Self,Place  O2 ([place] 1) External catheter Weight:   Height:     BEHAVIORAL SYMPTOMS/MOOD NEUROLOGICAL BOWEL NUTRITION STATUS      Incontinent Diet (See DC  summary)  AMBULATORY STATUS COMMUNICATION OF NEEDS Skin   Limited Assist Verbally  (Bilateral Ecchymosis, Arm, Sacrum)                       Personal Care Assistance Level of Assistance  Bathing,Feeding,Dressing Bathing Assistance: Limited assistance Feeding assistance: Limited assistance Dressing Assistance: Limited assistance     Functional Limitations Info  Sight,Hearing,Speech Sight Info: Impaired (Bilateral cataract surgery) Hearing Info: Adequate Speech Info: Adequate    SPECIAL CARE FACTORS FREQUENCY  PT (By licensed PT),OT (By licensed OT)     PT Frequency: 5x week OT Frequency: 5x week            Contractures Contractures Info: Not present    Additional Factors Info  Code Status,Allergies Code Status Info: DNR Allergies Info: Tramadol, Lotensin, Cardura           Current Medications (06/28/2020):  This is the current hospital active medication list Current Facility-Administered Medications  Medication Dose Route Frequency Provider Last Rate Last Admin  . 0.9 %  sodium chloride infusion  100 mL/hr Intravenous Continuous Jonah Blue, MD 100 mL/hr at 06/28/20 1000 100 mL/hr at 06/28/20 1000  . acetaminophen (TYLENOL) tablet 650 mg  650 mg Oral Q4H PRN Jonah Blue, MD       Or  . acetaminophen (TYLENOL) suppository 650 mg  650 mg Rectal Q4H PRN Jonah Blue, MD      . aspirin EC tablet 81 mg  81 mg Oral Daily Jonah Blue, MD   81 mg at 06/28/20 1040  . bisacodyl (DULCOLAX)  suppository 10 mg  10 mg Rectal Daily PRN Jonah Blue, MD      . enoxaparin (LOVENOX) injection 40 mg  40 mg Subcutaneous Q24H Jonah Blue, MD   40 mg at 06/28/20 1245  . famotidine (PEPCID) tablet 20 mg  20 mg Oral QHS Jonah Blue, MD   20 mg at 06/27/20 2128  . LORazepam (ATIVAN) injection 1-2 mg  1-2 mg Intravenous Q2H PRN Jonah Blue, MD      . memantine [facility]) tablet 5 mg  5 mg Oral BID Jonah Blue, MD   5 mg at 06/28/20 1038  . ondansetron  (ZOFRAN) tablet 4 mg  4 mg Oral Q6H PRN Jonah Blue, MD       Or  . ondansetron [facility]) injection 4 mg  4 mg Intravenous Q6H PRN Jonah Blue, MD      . pantoprazole (PROTONIX) EC tablet 40 mg  40 mg Oral Daily Jonah Blue, MD   40 mg at 06/28/20 1040  . rOPINIRole (REQUIP) tablet 1 mg  1 mg Oral QPM Jonah Blue, MD   1 mg at 06/27/20 1728  . rosuvastatin (CRESTOR) tablet 10 mg  10 mg Oral Noemi Chapel, MD   10 mg at 06/27/20 2128  . senna-docusate (Senokot-S) tablet 1 tablet  1 tablet Oral Daily Jonah Blue, MD   1 tablet at 06/28/20 1040     Discharge Medications: Please see discharge summary for a list of discharge medications.  Relevant Imaging Results:  Relevant Lab Results:   Additional Information SS# [id number], [address]

## 2020-06-28 NOTE — Progress Notes (Addendum)
PROGRESS NOTE  [name] QIH:[id number] DOB: 01-Jun-1927 DOA: 06/26/2020 PCP: Mahlon Gammon, MD  Brief History   86 year old woman PMH including hyperlipidemia, hypertension presented with seizure-like episode with left gaze deviation, confusion after being found unresponsive by staff at nursing facility.  Admitted for further evaluation of suspected postictal state.  Seen by neurology, felt to have had likely a focal seizure unprovoked in the setting of dementia, given age and comorbidities, nonspecific EEG, neurology recommended against AED.  Therapy recommended SNF.  Stable for discharge.    A & P  Seizure, focal, unprovoked --No recurrent seizures, no AED per neurology --If patient has another seizure, consider starting Keppra 250 mg twice daily.   --Continue seizure precautions. --follow-up w/ neurology in 6-8 weeks  Acute encephalopathy  --Resolved --CT head negative.  EEG suggestive of moderate diffuse encephalopathy, nonspecific but could be postictal in nature.  No seizures or epileptiform discharges noted. --MRI brain from May of this year without acute abnormality, no signs or symptoms to suggest infection and therefore lumbar puncture was deferred.  No signs or symptoms of infection. --No further evaluation suggested  Spurious hypoglycemia secondary to Raynaud's phenomenon, artifact  --No further evaluation suggested.  Patient was evaluated yesterday afternoon and found to have a Raynaud's phenomenon.  When thumb was warmed up blood sugar was actually hyperglycemic status post treatment.  Patient remained asymptomatic.  All low blood sugars were spurious secondary to Raynaud's.  Dementia --Remained stable, continue Namenda  CAD, PVD --stable, continue ASA  Pulmonary fibrosis --stable  Disposition Plan:  Discussion: Medical condition stable, ready for discharge, however SNF recommended, social work pursuing.  Status is: Inpatient  Remains inpatient  appropriate because:Inpatient level of care appropriate due to severity of illness   Dispo: The patient is from: ALF              Anticipated d/c is to: SNF              Anticipated d/c date is: 1 day              Patient currently is medically stable to d/c.  DVT prophylaxis: enoxaparin (LOVENOX) injection 40 mg Start: 06/26/20 1145   Code Status: DNR Family Communication: updated son [name] by telephone again 12/10  [name], MD  [facility] Direct contact: see [email or web address] (further directions at bottom of note if needed) 7PM-7AM contact night coverage as at bottom of note 06/28/2020, 11:46 AM  LOS: 2 days     Consults:  . Neurology    Procedures:  .   Significant Diagnostic Tests:  .    Interval History/Subjective  CC: f/u seizure  No new issues, no seizure activity.  Patient without complaints.  Tolerating diet.  She did have hypoglycemia yesterday afternoon, however upon further investigation this was artifactual secondary to Raynaud's phenomenon.  When blood sugar was concurrently checked in warm thumb, and was well within normal limits.  Again this morning artifactual hypoglycemia noted secondary to Raynaud's.  Objective   Vitals:  Vitals:   06/28/20 0346 06/28/20 0736  BP: 130/75 97/67  Pulse: 92 100  Resp: 18 16  Temp:  97.7 F (36.5 C)  SpO2:  96%    Exam:  Constitutional:   Appears calm and comfortable ENMT:   grossly normal hearing  Respiratory:   CTA bilaterally, no w/r/r.   Respiratory effort normal.  Cardiovascular:   RRR, no m/r/g  No LE extremity edema   Musculoskeletal:   RUE, LUE, RLE,  LLE    Moves all extremities to command Skin: Distal tips of fingers mildly blue, nontender, perfusion of the hands appears intact. Neurologic:   Grossly unremarkable Psychiatric:   Mental status ? Mood, affect appropriate ? Confused but appears stable   I have personally reviewed the following:   Today's Data    Low CBG artifact noted.  Accurate CBG 71.  Scheduled Meds: . aspirin EC  81 mg Oral Daily  . enoxaparin (LOVENOX) injection  40 mg Subcutaneous Q24H  . famotidine  20 mg Oral QHS  . memantine  5 mg Oral BID  . pantoprazole  40 mg Oral Daily  . rOPINIRole  1 mg Oral QPM  . rosuvastatin  10 mg Oral QHS  . senna-docusate  1 tablet Oral Daily   Continuous Infusions: . sodium chloride 100 mL/hr (06/28/20 1000)    Principal Problem:   New onset seizure without head trauma (HCC) Active Problems:   Carotid stenosis   CAD (coronary artery disease)   Hyperlipidemia   Postinflammatory pulmonary fibrosis (HCC)   Renovascular hypertension   CKD (chronic kidney disease) stage 3, GFR 30-59 ml/min (HCC)   Mixed dementia (HCC)   LOS: 2 days   How to contact the [facility] Attending or Consulting provider 7A - 7P or covering provider during after hours 7P -7A, for this patient?  1. Check the care team in [facility] and look for a) attending/consulting TRH provider listed and b) the [facility] team listed 2. Log into [email or web address] and use [facility]'s universal password to access. If you do not have the password, please contact the hospital operator. 3. Locate the [facility] provider you are looking for under [facility] and page to a number that you can be directly reached. 4. If you still have difficulty reaching the provider, please page the [facility] (Director on Call) for the Hospitalists listed on amion for assistance.

## 2020-06-28 NOTE — TOC Initial Note (Signed)
Transition of Care [facility]) - Initial/Assessment Note    Patient Details  Name: [name] MRN: [id number] Date of Birth: 05-19-1927  Transition of Care [facility]) CM/SW Contact:    Carley Hammed, LCSWA Phone Number: 06/28/2020, 1:00 PM  Clinical Narrative:                 CSW spoke with [place] from [facility], they are able to accept pt for short term rehab. Son is agreeable to this plan. Facility stated they could take today if ready. Will get a rapid covid test, and do workup, SW will continue to follow.  Expected Discharge Plan: Skilled Nursing Facility Barriers to Discharge: SNF Pending bed offer,SNF Pending discharge summary   Patient Goals and CMS Choice Patient states their goals for this hospitalization and ongoing recovery are:: Pt's son agreeable to going to SNF at [facility]. CMS Medicare.gov Compare Post Acute Care list provided to:: Patient Represenative (must comment) (Son) Choice offered to / list presented to : Adult Children  Expected Discharge Plan and Services Expected Discharge Plan: Skilled Nursing Facility     Post Acute Care Choice: Skilled Nursing Facility Living arrangements for the past 2 months: Assisted Living Facility                                      Prior Living Arrangements/Services Living arrangements for the past 2 months: Assisted Living Facility Lives with:: Facility Resident Patient language and need for interpreter reviewed:: Yes        Need for Family Participation in Patient Care: Yes (Comment) Care giver support system in place?: Yes (comment)   Criminal Activity/Legal Involvement Pertinent to Current Situation/Hospitalization: No - Comment as needed  Activities of Daily Living   ADL Screening (condition at time of admission) Patient's cognitive ability adequate to safely complete daily activities?: No Is the patient deaf or have difficulty hearing?: No Does the patient have difficulty seeing, even  when wearing glasses/contacts?: No Does the patient have difficulty concentrating, remembering, or making decisions?: Yes Patient able to express need for assistance with ADLs?: Yes Does the patient have difficulty dressing or bathing?: Yes Independently performs ADLs?: No Communication: Independent Dressing (OT): Needs assistance Grooming: Needs assistance Feeding: Independent Bathing: Dependent Toileting: Dependent In/Out Bed: Needs assistance Weakness of Legs: Both  Permission Sought/Granted Permission sought to share information with : Family Electrical engineer Permission granted to share information with : Yes, Verbal Permission Granted  Share Information with NAME: [name]  Permission granted to share info w AGENCY: [facility], [facility]  Permission granted to share info w Relationship: Son  Permission granted to share info w Contact Information: [phone]  Emotional Assessment Appearance:: Other (Comment Required (Unable to Assess) Attitude/Demeanor/Rapport: Unable to Assess Affect (typically observed): Unable to Assess Orientation: : Oriented to Self,Oriented to Place Alcohol / Substance Use: Not Applicable Psych Involvement: No (comment)  Admission diagnosis:  Seizure-like activity (HCC) [R56.9] Altered mental status, unspecified altered mental status type [R41.82] New onset seizure without head trauma [facility]) [R56.9] Patient Active Problem List   Diagnosis Date Noted  . New onset seizure without head trauma (HCC) 06/26/2020  . Slow transit constipation 05/31/2020  . Lower back pain 04/22/2020  . Vitamin D deficiency 02/23/2020  . Restless leg syndrome 02/23/2020  . Mixed dementia (HCC) 02/23/2020  . Osteoporosis 02/23/2020  . GERD (gastroesophageal reflux disease) 02/23/2020  .  CKD (chronic kidney disease) stage 3, GFR 30-59 ml/min (HCC) 12/29/2019  . Palpitations   . Chest tightness   . Coronary artery disease   . Carotid  arterial disease (HCC)   . Renovascular hypertension 04/17/2016  . Essential hypertension vs Renovascular hbp 12/26/2014  . Postinflammatory pulmonary fibrosis (HCC) 12/25/2014  . Tachycardia 12/13/2014  . Aftercare following surgery of the circulatory system, NEC 11/28/2013  . CAD (coronary artery disease) 09/21/2013  . Right renal artery stenosis (HCC) 09/21/2013  . Hyperlipidemia 09/21/2013  . First degree AV block 09/21/2013  . Carotid stenosis 11/24/2011   PCP:  Mahlon Gammon, MD Pharmacy:   [id number] [place], [place] - [address] [place] [address] Phone: [phone] Fax: [phone]  [facility] [place], [place] - [address] AT Portal to Registered Caremark Sites [address] [place] [address] Phone: [phone] Fax: [phone]  [id number] - [place], [place] - [address] AT [facility] [address] [place] [address] Phone: [phone] Fax: [phone]     Social Determinants of Health (SDOH) Interventions    Readmission Risk Interventions No flowsheet data found.

## 2020-06-28 NOTE — Discharge Summary (Addendum)
Physician Discharge Summary  [name] STM:[id number] DOB: [birth year] DOA: 06/26/2020  PCP: Mahlon Gammon, MD  Admit date: 06/26/2020 Discharge date: 06/28/2020  Recommendations for Outpatient Follow-up:  1. Outpatient neurology follow-up for suspected seizure 2. Antihypertensives put on hold, both metoprolol and Diovan hydrochlorothiazide, secondary to normotension without.  Given advanced age would consider permitting higher blood pressure before reinitiating. 3. Suggest ongoing speech therapy for diet tolerance   Follow-up Information    Mahlon Gammon, MD. Schedule an appointment as soon as possible for a visit in 2 week(s).   Specialty: Internal Medicine Contact information: [address] [place] [address] [phone]                Discharge Diagnoses: Principal diagnosis is #1 Principal Problem:   New onset seizure without head trauma (HCC) Active Problems:   Carotid stenosis   CAD (coronary artery disease)   Hyperlipidemia   Postinflammatory pulmonary fibrosis (HCC)   Renovascular hypertension   CKD (chronic kidney disease) stage 3, GFR 30-59 ml/min (HCC)   Mixed dementia (HCC) dysphagia unspecificed  Discharge Condition: improved Disposition: short-term SNF  Diet recommendation:  Diet Orders (From admission, onward)    Start     Ordered   06/27/20 1411  DIET DYS 3 Room service appropriate? Yes; Fluid consistency: Thin  Diet effective now       Comments: Meds as tolerated; vs whole in puree  Question Answer Comment  Room service appropriate? Yes   Fluid consistency: Thin      06/27/20 1410         Recommend full supervision with all POs with pt hx of cognitive deficits.   HPI/Hospital Course:   86 year old woman PMH including hyperlipidemia, hypertension presented with seizure-like episode with left gaze deviation, confusion after being found unresponsive by staff at nursing facility.  Admitted for further evaluation of suspected  postictal state.  Seen by neurology, felt to have had likely a focal seizure unprovoked in the setting of dementia, given age and comorbidities, nonspecific EEG, neurology recommended against AED.  Therapy recommended SNF.  Stable for discharge.    Seizure, focal, unprovoked --No recurrent seizures, no AED per neurology --If patient has another seizure, consider starting Keppra 250 mg twice daily.   --Continue seizure precautions. --follow-up w/ neurology in 6-8 weeks  Acute encephalopathy  --Resolved --CT head negative.  EEG suggestive of moderate diffuse encephalopathy, nonspecific but could be postictal in nature.  No seizures or epileptiform discharges noted. --MRI brain from May of this year without acute abnormality, no signs or symptoms to suggest infection and therefore lumbar puncture was deferred.  No signs or symptoms of infection. --No further evaluation suggested  Spurious hypoglycemia secondary to Raynaud's phenomenon, artifact  --No further evaluation suggested.    Dementia --Remained stable, continue Namenda  Dysphagia unspecified --Diet as per speech therapy above  CAD, PVD --stable, continue ASA  Consults:   Neurology   Today's assessment: See progress note same day  Discharge Instructions  Discharge Instructions    Ambulatory referral to Neurology   Complete by: As directed    An appointment is requested in approximately: 6-8 weeks     Allergies as of 06/28/2020      Reactions   Cardura [doxazosin Mesylate] Other (See Comments)   Reaction not recalled by the patient   Lotensin [benazepril Hcl] Other (See Comments)   Reaction not recalled by the patient   Tramadol Nausea And Vomiting      Medication List  STOP taking these medications   metoprolol tartrate 50 MG tablet Commonly known as: LOPRESSOR   valsartan-hydrochlorothiazide 160-25 MG tablet Commonly known as: DIOVAN-HCT     TAKE these medications   acetaminophen 325 MG  tablet Commonly known as: TYLENOL Take 650 mg by mouth at bedtime.   aspirin EC 81 MG tablet Take 1 tablet (81 mg total) by mouth daily.   bisacodyl 10 MG suppository Commonly known as: DULCOLAX Place 10 mg rectally daily as needed for moderate constipation.   famotidine 20 MG tablet Commonly known as: PEPCID TAKE 1 TABLET BY MOUTH EVERYDAY AT BEDTIME What changed: See the new instructions.   memantine 5 MG tablet Commonly known as: NAMENDA Take 5 mg by mouth 2 (two) times daily.   pantoprazole 40 MG tablet Commonly known as: PROTONIX TAKE 1 TABLET (40 MG TOTAL) BY MOUTH DAILY. TAKE 30-60 MIN BEFORE FIRST MEAL OF THE DAY What changed: additional instructions   rOPINIRole 1 MG tablet Commonly known as: REQUIP Take 1 mg by mouth every evening.   rosuvastatin 10 MG tablet Commonly known as: CRESTOR Take 10 mg by mouth at bedtime.   senna-docusate 8.6-50 MG tablet Commonly known as: Senokot-S Take 2 tablets by mouth at bedtime.   Vitamin D3 25 MCG tablet Commonly known as: Vitamin D Take 1,000 Units by mouth daily.      Allergies  Allergen Reactions  . Cardura [Doxazosin Mesylate] Other (See Comments)    Reaction not recalled by the patient  . Lotensin [Benazepril Hcl] Other (See Comments)    Reaction not recalled by the patient  . Tramadol Nausea And Vomiting    The results of significant diagnostics from this hospitalization (including imaging, microbiology, ancillary and laboratory) are listed below for reference.    Significant Diagnostic Studies: EEG  Result Date: 06/26/2020 Charlsie Quest, MD     06/26/2020 12:29 PM Patient Name: [name] MRN: [id number] Epilepsy Attending: Charlsie Quest Referring Physician/Provider: Dr. Jonah Blue Date: 06/26/1020 Duration: 25.23 mins Patient history: 86 year old female with new onset seizure-like episode.  EEG to evaluate for seizures. Level of alertness: Awake AEDs during EEG study: Versed Technical  aspects: This EEG study was done with scalp electrodes positioned according to the 10-20 International system of electrode placement. Electrical activity was acquired at a sampling rate of 500Hz  and reviewed with a high frequency filter of 70Hz  and a low frequency filter of 1Hz . EEG data were recorded continuously and digitally stored. Description: No posterior dominant rhythm was seen. EEG showed continuous generalized 3 to 6 Hz theta-delta slowing. Hyperventilation and photic stimulation were not performed.   Of note, EEG was technically difficult due to significant myogenic electrode artifact. ABNORMALITY -Continuous slow, generalized IMPRESSION: This technically difficult study is suggestive of moderate diffuse encephalopathy, nonspecific etiology but could be secondary to postictal state.  No seizures or epileptiform discharges were seen throughout the recording.   CT HEAD WO CONTRAST  Result Date: 06/26/2020 CLINICAL DATA:  Found unresponsive, seizure activity EXAM: CT HEAD WITHOUT CONTRAST TECHNIQUE: Contiguous axial images were obtained from the base of the skull through the vertex without intravenous contrast. COMPARISON:  11/09/2019 FINDINGS: Brain: There is no acute intracranial hemorrhage, mass effect, or edema. Gray-white differentiation is preserved. There is no extra-axial fluid collection. Prominence of the ventricles and sulci reflects stable parenchymal volume loss. Patchy hypoattenuation in the supratentorial white matter is nonspecific but probably reflects stable chronic microvascular ischemic changes. Vascular: There is atherosclerotic calcification at the  skull base. Skull: Calvarium is unremarkable. Sinuses/Orbits: Polypoid right maxillary sinus mucosal thickening. Other: None. IMPRESSION: No acute intracranial abnormality. Stable chronic/nonemergent findings detailed above. Electronically Signed   By: Guadlupe SpanishPraneil  Patel M.D.   On: 06/26/2020 08:19   CT Cervical Spine Wo  Contrast  Result Date: 06/26/2020 CLINICAL DATA:  Found unresponsive, seizure activity EXAM: CT CERVICAL SPINE WITHOUT CONTRAST TECHNIQUE: Multidetector CT imaging of the cervical spine was performed without intravenous contrast. Multiplanar CT image reconstructions were also generated. COMPARISON:  None. FINDINGS: Alignment: Trace retrolisthesis at C5-C6. Skull base and vertebrae: No acute cervical spine fracture. Vertebral body heights are maintained. Soft tissues and spinal canal: No prevertebral fluid or swelling. No visible canal hematoma. Disc levels: Degenerative changes are present greatest at C5-C6 and C6-C7. Upper chest: Scarring at the lung apices. Other: Calcified plaque at the common carotid bifurcations. IMPRESSION: No acute cervical spine fracture. Electronically Signed   By: Guadlupe SpanishPraneil  Patel M.D.   On: 06/26/2020 08:23   DG Chest Portable 1 View  Result Date: 06/26/2020 CLINICAL DATA:  Altered mental status. EXAM: PORTABLE CHEST 1 VIEW COMPARISON:  November 16, 2016. FINDINGS: The heart size and mediastinal contours are within normal limits. No pneumothorax or pleural effusion is noted. Stable interstitial densities are noted throughout both lungs most consistent with chronic interstitial lung disease or fibrosis, but acute superimposed inflammation or edema cannot be excluded. The visualized skeletal structures are unremarkable. IMPRESSION: Stable interstitial densities are noted throughout both lungs most consistent with chronic interstitial lung disease or fibrosis, but acute superimposed inflammation or edema cannot be excluded. Aortic Atherosclerosis (ICD10-I70.0). Electronically Signed   By: Lupita RaiderJames  Green Jr M.D.   On: 06/26/2020 10:53    Microbiology: Recent Results (from the past 240 hour(s))  Resp Panel by RT-PCR (Flu A&B, Covid) Nasopharyngeal Swab     Status: None   Collection Time: 06/26/20  3:00 PM   Specimen: Nasopharyngeal Swab; Nasopharyngeal(NP) swabs in vial transport medium   Result Value Ref Range Status   SARS Coronavirus 2 by RT PCR NEGATIVE NEGATIVE Final    Comment: (NOTE) SARS-CoV-2 target nucleic acids are NOT DETECTED.  The SARS-CoV-2 RNA is generally detectable in upper respiratory specimens during the acute phase of infection. The lowest concentration of SARS-CoV-2 viral copies this assay can detect is 138 copies/mL. A negative result does not preclude SARS-Cov-2 infection and should not be used as the sole basis for treatment or other patient management decisions. A negative result may occur with  improper specimen collection/handling, submission of specimen other than nasopharyngeal swab, presence of viral mutation(s) within the areas targeted by this assay, and inadequate number of viral copies(<138 copies/mL). A negative result must be combined with clinical observations, patient history, and epidemiological information. The expected result is Negative.  Fact Sheet for Patients:  BloggerCourse.comhttps://www.fda.gov/media/152166/download  Fact Sheet for Healthcare Providers:  SeriousBroker.ithttps://www.fda.gov/media/152162/download  This test is no t yet approved or cleared by the [place] FDA and  has been authorized for detection and/or diagnosis of SARS-CoV-2 by FDA under an Emergency Use Authorization (EUA). This EUA will remain  in effect (meaning this test can be used) for the duration of the COVID-19 declaration under Section 564(b)(1) of the Act, 21 U.S.C.section 360bbb-3(b)(1), unless the authorization is terminated  or revoked sooner.       Influenza A by PCR NEGATIVE NEGATIVE Final   Influenza B by PCR NEGATIVE NEGATIVE Final    Comment: (NOTE) The Xpert Xpress SARS-CoV-2/FLU/RSV plus assay is intended as an aid in  the diagnosis of influenza from Nasopharyngeal swab specimens and should not be used as a sole basis for treatment. Nasal washings and aspirates are unacceptable for Xpert Xpress SARS-CoV-2/FLU/RSV testing.  Fact Sheet for  Patients: BloggerCourse.com  Fact Sheet for Healthcare Providers: SeriousBroker.it  This test is not yet approved or cleared by the [place] FDA and has been authorized for detection and/or diagnosis of SARS-CoV-2 by FDA under an Emergency Use Authorization (EUA). This EUA will remain in effect (meaning this test can be used) for the duration of the COVID-19 declaration under Section 564(b)(1) of the Act, 21 U.S.C. section 360bbb-3(b)(1), unless the authorization is terminated or revoked.  Performed at [facility], [address], [place], [place] [address]      Labs: Basic Metabolic Panel: Recent Labs  Lab 06/26/20 0830 06/27/20 0413 06/27/20 1729  NA 134* 135  --   K 3.9 3.0*  --   CL 93* 95*  --   CO2 29 25  --   GLUCOSE 130* 92 291*  BUN 21 13  --   CREATININE 0.89 0.85  --   CALCIUM 10.0 9.5  --    Liver Function Tests: Recent Labs  Lab 06/26/20 0830 06/27/20 0413  AST 23 35  ALT 20 20  ALKPHOS 60 56  BILITOT 0.8 1.5*  PROT 6.5 6.1*  ALBUMIN 3.9 3.5   CBC: Recent Labs  Lab 06/26/20 0830 06/27/20 0413  WBC 12.6* 10.7*  NEUTROABS 11.2*  --   HGB 14.1 14.2  HCT 42.4 40.3  MCV 94.2 90.6  PLT 193 155    CBG: Recent Labs  Lab 06/27/20 1637 06/27/20 1640 06/27/20 1654 06/28/20 0941 06/28/20 1015  GLUCAP 31* 41* 166* 50* 71  low CBG are artifact secondary to Raynaud's  Principal Problem:   New onset seizure without head trauma (HCC) Active Problems:   Carotid stenosis   CAD (coronary artery disease)   Hyperlipidemia   Postinflammatory pulmonary fibrosis (HCC)   Renovascular hypertension   CKD (chronic kidney disease) stage 3, GFR 30-59 ml/min (HCC)   Mixed dementia (HCC)   Time coordinating discharge: 35 minutes  Signed:  [name], MD  [facility]  06/28/2020, 2:11 PM

## 2020-06-28 NOTE — Evaluation (Signed)
Physical Therapy Evaluation Patient Details Name: [name] MRN: [id number] DOB: 31-May-1927 Today's Date: 06/28/2020   History of Present Illness  Pt is a 86 year old female who presented with prolonged seizure-like activity. EEG concerning for moderate diffuse encephalopathy without apparent seizure activity. Suspecting single unprovoked likely focal seizure in the setting of dementia. CT of head and cervical spine were negative. PMH: early stage dementia, palpitations, leg pain, HTN, CAD, chest tightness, carotid arterial disease, and anemia.  Clinical Impression  Pt presents with condition mentioned above and deficits mentioned below, see PT Problem List. Per RN at her ALF via phone call, pt is independent with use of a rollator for all functional mobility and only occasionally a little confused. She requires min guard assist for showers though. This date, pt required minA for all transfers, for bed mob, and for ambulating ~15 ft with a RW. She demonstrated STM, processing, problem-solving, and awareness deficits impacting her safety with all mobility. She tends to push the RW distal to her body despite cues and assistance to manage the RW. Pt also demonstrates balance deficits, possibly due to decreased leg muscular strength and overall coordination. Due to the RN at her ALF stating that they are unable to provide assistance for all mobility and ADLs and the pt currently needing assistance due to her deficits physically and cognitively, recommending d/c to SNF. However, pt would greatly benefit from returning to her ALF to be in a more familiar environment as she has a hx of early dementia and being outside of her familiar environment could cause further decline in cognition and overall function. Will continue to follow acutely.    Follow Up Recommendations SNF;Supervision/Assistance - 24 hour    Equipment Recommendations  Rolling walker with 5" wheels    Recommendations for Other  Services       Precautions / Restrictions Precautions Precautions: Fall Restrictions Weight Bearing Restrictions: No      Mobility  Bed Mobility Overal bed mobility: Needs Assistance Bed Mobility: Supine to Sit     Supine to sit: HOB elevated;Min assist     General bed mobility comments: HHA to pull trunk to ascend to sit EOB with cues and extra time to manage legs off bed.    Transfers Overall transfer level: Needs assistance Equipment used: Rolling walker (2 wheeled) Transfers: Sit to/from [employer] Sit to Stand: Min assist Stand pivot transfers: Min assist       General transfer comment: Extra time and minA to power up to stand and cue pt for hand placement. MinA to manage RW and cue pt to sequence steps and movement of RW to turn to transfer to chair.  Ambulation/Gait Ambulation/Gait assistance: Min assist Gait Distance (Feet): 15 Feet Assistive device: Rolling walker (2 wheeled) Gait Pattern/deviations: Step-through pattern;Decreased stride length;Shuffle;Narrow base of support;Trunk flexed Gait velocity: decreased Gait velocity interpretation: <1.31 ft/sec, indicative of household ambulator General Gait Details: Ambulates with narrow BOS and shuffling pattern, cued to increase step length, with min success. Tendency to push RW distal to body, with minA and extensive cues to correct with min success. Cues and assistance for RW management with turns. Several minor LOB with minA to recover.  Stairs            Wheelchair Mobility    Modified Rankin (Stroke Patients Only) Modified Rankin (Stroke Patients Only) Pre-Morbid Rankin Score: Slight disability Modified Rankin: Moderately severe disability     Balance Overall balance assessment: Needs assistance Sitting-balance support: Bilateral upper  extremity supported;Feet supported;No upper extremity supported Sitting balance-Leahy Scale: Fair Sitting balance - Comments: UE support and  minA-min guard assist for static sitting EOB and reaching to donn socks 1x.   Standing balance support: Bilateral upper extremity supported;During functional activity Standing balance-Leahy Scale: Poor Standing balance comment: MinA-min guard assist with UE support on RW for balance.                             Pertinent Vitals/Pain Pain Assessment: No/denies pain    Home Living Family/patient expects to be discharged to:: Assisted living ([facility])               Home Equipment: Shower seat;Grab bars - toilet;Grab bars - tub/shower;Hospital bed;Walker - 4 wheels Additional Comments: Per nurse at ALF, pt was independent with rollator with all mobility and ADLs except min guard assist foir showers as needed.    Prior Function Level of Independence: Independent with assistive device(s);Needs assistance   Gait / Transfers Assistance Needed: Mod I with use of rollator for all mobility  ADL's / Homemaking Assistance Needed: Independent except min guard for showers as needed  Comments: Per RN at facility, pt was cognitively intact except intermittent bouts of a little confuision. RN stated they are not able to provide continual close min guard assist and cues for all tasks and mobility at their facility.     Hand Dominance        Extremity/Trunk Assessment   Upper Extremity Assessment Upper Extremity Assessment: Defer to OT evaluation    Lower Extremity Assessment Lower Extremity Assessment: RLE deficits/detail;LLE deficits/detail RLE Deficits / Details: MMT scores: hip flexion 3+, knee extension 4+, knee flexion 4-, ankle dorsiflexion 4+ RLE Sensation:  (denied numbness/tingling) RLE Coordination: decreased gross motor LLE Deficits / Details: MMT scores: hip flexion 3+, knee extension 4+, knee flexion 4-, ankle dorsiflexion 4+ LLE Sensation:  (denied numbness/tingling) LLE Coordination: decreased gross motor    Cervical / Trunk Assessment Cervical /  Trunk Assessment: Kyphotic  Communication   Communication: No difficulties  Cognition Arousal/Alertness: Awake/alert Behavior During Therapy: WFL for tasks assessed/performed Overall Cognitive Status: Impaired/Different from baseline Area of Impairment: Orientation;Attention;Memory;Following commands;Safety/judgement;Awareness;Problem solving                 Orientation Level: Disoriented to;Time;Situation Current Attention Level: Sustained Memory: Decreased short-term memory Following Commands: Follows one step commands consistently;Follows one step commands with increased time Safety/Judgement: Decreased awareness of safety;Decreased awareness of deficits Awareness: Emergent Problem Solving: Slow processing;Difficulty sequencing;Requires verbal cues General Comments: According to RN report at her ALF pt is only occasionally a little confused, thus this is not her baseline per RN. Pt disoriented to situation and date. STM deficits, with hx of early dementia, as after walking within room and sitting in chair for several min pt then stated "Oh we are back in the room now?". Required continual single step commands to manage RW and maintain safety.      General Comments General comments (skin integrity, edema, etc.): RN reported SpO2 in high 90s% but unable to get pulse ox to read on finger during session    Exercises     Assessment/Plan    PT Assessment Patient needs continued PT services  PT Problem List Decreased strength;Decreased activity tolerance;Decreased balance;Decreased mobility;Decreased coordination;Decreased cognition;Decreased knowledge of use of DME;Decreased safety awareness;Cardiopulmonary status limiting activity       PT Treatment Interventions DME instruction;Gait training;Functional mobility training;Therapeutic exercise;Therapeutic activities;Balance training;Neuromuscular re-education;Cognitive  remediation    PT Goals (Current goals can be found in the  Care Plan section)  Acute Rehab PT Goals Patient Stated Goal: to walk PT Goal Formulation: With patient Time For Goal Achievement: 07/12/20 Potential to Achieve Goals: Fair    Frequency Min 3X/week   Barriers to discharge        Co-evaluation               AM-PAC PT "6 Clicks" Mobility  Outcome Measure Help needed turning from your back to your side while in a flat bed without using bedrails?: A Little Help needed moving from lying on your back to sitting on the side of a flat bed without using bedrails?: A Little Help needed moving to and from a bed to a chair (including a wheelchair)?: A Little Help needed standing up from a chair using your arms (e.g., wheelchair or bedside chair)?: A Little Help needed to walk in hospital room?: A Little Help needed climbing 3-5 steps with a railing? : A Lot 6 Click Score: 17    End of Session Equipment Utilized During Treatment: Gait belt;Oxygen (2L/min via [place]) Activity Tolerance: Patient tolerated treatment well Patient left: in chair;with call bell/phone within reach;with chair alarm set;with restraints reapplied Nurse Communication: Mobility status PT Visit Diagnosis: Unsteadiness on feet (R26.81);Other abnormalities of gait and mobility (R26.89);Muscle weakness (generalized) (M62.81);Difficulty in walking, not elsewhere classified (R26.2)    Time: 7893-8101 PT Time Calculation (min) (ACUTE ONLY): 43 min   Charges:   PT Evaluation $PT Eval Moderate Complexity: 1 Mod PT Treatments $Gait Training: 8-22 mins $Therapeutic Activity: 8-22 mins        [name], PT, DPT Acute Rehabilitation Services  Pager: [phone] Office: [phone]   [name] 06/28/2020, 9:59 AM

## 2020-06-28 NOTE — Progress Notes (Signed)
Called report to receiving facility [facility], spoke to the nurse Myrtle. Patient discharge packet completed, awaiting transport by [facility]

## 2020-06-28 NOTE — TOC Transition Note (Signed)
Transition of Care [facility]) - CM/SW Discharge Note   Patient Details  Name: [name] MRN: [id number] Date of Birth: 1927/06/27  [facility] CM/SW Contact:  Carley Hammed, LCSWA Phone Number: 06/28/2020, 1:16 PM   Clinical Narrative:    Nurse to call report to [phone] ext [phone] Rm# Skilled Healthcare [facility]   Final next level of care: Skilled Nursing Facility Barriers to Discharge: SNF Pending discharge summary,Other (comment) (SNF Pending Negative Covid Test)   Patient Goals and CMS Choice Patient states their goals for this hospitalization and ongoing recovery are:: Pt's son agreeable to going to SNF at [facility]. CMS Medicare.gov Compare Post Acute Care list provided to:: Patient Represenative (must comment) (Son) Choice offered to / list presented to : Adult Children  Discharge Placement              Patient chooses bed at: [facility] Patient to be transferred to facility by: [facility] Name of family member notified: [name] Patient and family notified of of transfer: 06/28/20  Discharge Plan and Services     Post Acute Care Choice: Skilled Nursing Facility                               Social Determinants of Health (SDOH) Interventions     Readmission Risk Interventions No flowsheet data found.

## 2020-06-29 DIAGNOSIS — I6523 Occlusion and stenosis of bilateral carotid arteries: Secondary | ICD-10-CM | POA: Diagnosis not present

## 2020-06-29 DIAGNOSIS — G2581 Restless legs syndrome: Secondary | ICD-10-CM | POA: Diagnosis not present

## 2020-06-29 DIAGNOSIS — E559 Vitamin D deficiency, unspecified: Secondary | ICD-10-CM | POA: Diagnosis not present

## 2020-06-29 DIAGNOSIS — K219 Gastro-esophageal reflux disease without esophagitis: Secondary | ICD-10-CM | POA: Diagnosis not present

## 2020-06-29 DIAGNOSIS — G309 Alzheimer's disease, unspecified: Secondary | ICD-10-CM | POA: Diagnosis not present

## 2020-06-29 DIAGNOSIS — E782 Mixed hyperlipidemia: Secondary | ICD-10-CM | POA: Diagnosis not present

## 2020-06-29 DIAGNOSIS — M810 Age-related osteoporosis without current pathological fracture: Secondary | ICD-10-CM | POA: Diagnosis not present

## 2020-06-29 DIAGNOSIS — N183 Chronic kidney disease, stage 3 unspecified: Secondary | ICD-10-CM | POA: Diagnosis not present

## 2020-06-29 DIAGNOSIS — J8410 Pulmonary fibrosis, unspecified: Secondary | ICD-10-CM | POA: Diagnosis not present

## 2020-06-29 DIAGNOSIS — I150 Renovascular hypertension: Secondary | ICD-10-CM | POA: Diagnosis not present

## 2020-06-29 DIAGNOSIS — M2550 Pain in unspecified joint: Secondary | ICD-10-CM | POA: Diagnosis not present

## 2020-06-29 DIAGNOSIS — M545 Low back pain, unspecified: Secondary | ICD-10-CM | POA: Diagnosis not present

## 2020-06-29 DIAGNOSIS — I779 Disorder of arteries and arterioles, unspecified: Secondary | ICD-10-CM | POA: Diagnosis not present

## 2020-06-29 DIAGNOSIS — M6281 Muscle weakness (generalized): Secondary | ICD-10-CM | POA: Diagnosis not present

## 2020-06-29 DIAGNOSIS — I10 Essential (primary) hypertension: Secondary | ICD-10-CM | POA: Diagnosis not present

## 2020-06-29 DIAGNOSIS — K5901 Slow transit constipation: Secondary | ICD-10-CM | POA: Diagnosis not present

## 2020-06-29 DIAGNOSIS — Z7401 Bed confinement status: Secondary | ICD-10-CM | POA: Diagnosis not present

## 2020-06-29 DIAGNOSIS — R000 Tachycardia, unspecified: Secondary | ICD-10-CM | POA: Diagnosis not present

## 2020-06-29 DIAGNOSIS — E785 Hyperlipidemia, unspecified: Secondary | ICD-10-CM | POA: Diagnosis not present

## 2020-06-29 DIAGNOSIS — I2510 Atherosclerotic heart disease of native coronary artery without angina pectoris: Secondary | ICD-10-CM | POA: Diagnosis not present

## 2020-06-29 DIAGNOSIS — F0150 Vascular dementia without behavioral disturbance: Secondary | ICD-10-CM | POA: Diagnosis not present

## 2020-06-29 DIAGNOSIS — R2681 Unsteadiness on feet: Secondary | ICD-10-CM | POA: Diagnosis not present

## 2020-06-29 DIAGNOSIS — R410 Disorientation, unspecified: Secondary | ICD-10-CM | POA: Diagnosis not present

## 2020-06-29 DIAGNOSIS — N1832 Chronic kidney disease, stage 3b: Secondary | ICD-10-CM | POA: Diagnosis not present

## 2020-06-29 DIAGNOSIS — Z9181 History of falling: Secondary | ICD-10-CM | POA: Diagnosis not present

## 2020-06-29 DIAGNOSIS — R4789 Other speech disturbances: Secondary | ICD-10-CM | POA: Diagnosis not present

## 2020-06-29 DIAGNOSIS — F0280 Dementia in other diseases classified elsewhere without behavioral disturbance: Secondary | ICD-10-CM | POA: Diagnosis not present

## 2020-06-29 DIAGNOSIS — R569 Unspecified convulsions: Secondary | ICD-10-CM | POA: Diagnosis not present

## 2020-06-29 DIAGNOSIS — R1310 Dysphagia, unspecified: Secondary | ICD-10-CM | POA: Diagnosis not present

## 2020-06-29 NOTE — Progress Notes (Signed)
Report called to [facility].  [facility] here to pick up patient.  Patient discharged to [facility] via [place] escorted by 2 attendants.

## 2020-07-01 ENCOUNTER — Non-Acute Institutional Stay (SKILLED_NURSING_FACILITY): Payer: Medicare Other | Admitting: Nurse Practitioner

## 2020-07-01 ENCOUNTER — Encounter: Payer: Self-pay | Admitting: Nurse Practitioner

## 2020-07-01 DIAGNOSIS — R569 Unspecified convulsions: Secondary | ICD-10-CM

## 2020-07-01 DIAGNOSIS — F0150 Vascular dementia without behavioral disturbance: Secondary | ICD-10-CM

## 2020-07-01 DIAGNOSIS — I2510 Atherosclerotic heart disease of native coronary artery without angina pectoris: Secondary | ICD-10-CM

## 2020-07-01 DIAGNOSIS — I150 Renovascular hypertension: Secondary | ICD-10-CM

## 2020-07-01 DIAGNOSIS — N1832 Chronic kidney disease, stage 3b: Secondary | ICD-10-CM

## 2020-07-01 DIAGNOSIS — K5901 Slow transit constipation: Secondary | ICD-10-CM

## 2020-07-01 DIAGNOSIS — K219 Gastro-esophageal reflux disease without esophagitis: Secondary | ICD-10-CM

## 2020-07-01 DIAGNOSIS — R1310 Dysphagia, unspecified: Secondary | ICD-10-CM | POA: Insufficient documentation

## 2020-07-01 DIAGNOSIS — F0280 Dementia in other diseases classified elsewhere without behavioral disturbance: Secondary | ICD-10-CM

## 2020-07-01 DIAGNOSIS — J8410 Pulmonary fibrosis, unspecified: Secondary | ICD-10-CM

## 2020-07-01 DIAGNOSIS — G309 Alzheimer's disease, unspecified: Secondary | ICD-10-CM | POA: Diagnosis not present

## 2020-07-01 DIAGNOSIS — R000 Tachycardia, unspecified: Secondary | ICD-10-CM

## 2020-07-01 DIAGNOSIS — E782 Mixed hyperlipidemia: Secondary | ICD-10-CM

## 2020-07-01 DIAGNOSIS — M545 Low back pain, unspecified: Secondary | ICD-10-CM | POA: Diagnosis not present

## 2020-07-01 DIAGNOSIS — I6523 Occlusion and stenosis of bilateral carotid arteries: Secondary | ICD-10-CM

## 2020-07-01 DIAGNOSIS — I10 Essential (primary) hypertension: Secondary | ICD-10-CM

## 2020-07-01 DIAGNOSIS — G2581 Restless legs syndrome: Secondary | ICD-10-CM

## 2020-07-01 NOTE — Assessment & Plan Note (Signed)
Renovascular HTN, Metoprolol, Diovan. HCTZ held for normotension

## 2020-07-01 NOTE — Assessment & Plan Note (Addendum)
Renovascular HTN, Metoprolol, Diovan. HCTZ held for normotension, elevated Bp at [facility] in setting of HR 106 bpm, Bp 168/90  at rest upon my examination.  Will resume Metoprolol 25mg  bid.

## 2020-07-01 NOTE — Assessment & Plan Note (Signed)
Hyperlipidemia, takes Rosuvastatin

## 2020-07-01 NOTE — Assessment & Plan Note (Signed)
Dementia, takes Memantine.    

## 2020-07-01 NOTE — Assessment & Plan Note (Signed)
Renovascular HTN, Metoprolol, Diovan. HCTZ held for normotension, elevated Bp at [facility] in setting of HR 106 bpm at rest upon my examination. Will resume Metoprolol 25mg  bid.

## 2020-07-01 NOTE — Assessment & Plan Note (Signed)
Constipation, takes Senokot S, prn Bisacodyl suppository  

## 2020-07-01 NOTE — Assessment & Plan Note (Signed)
CKD Bun/creat 13/0.85 06/27/20 

## 2020-07-01 NOTE — Assessment & Plan Note (Signed)
Carotid stenosis US carotid artery: <40% internal carotid artery stenosis bilaterally, s/p endarterectomy.   

## 2020-07-01 NOTE — Assessment & Plan Note (Signed)
GERD, takes Pantoprazole, Famotidine 

## 2020-07-01 NOTE — Assessment & Plan Note (Signed)
Stable, takes Requip 

## 2020-07-01 NOTE — Assessment & Plan Note (Signed)
Dysphagia, diet per ST recommendation

## 2020-07-01 NOTE — Assessment & Plan Note (Signed)
OA, general, takes Tylenol 650mg qhs. 

## 2020-07-01 NOTE — Assessment & Plan Note (Signed)
Postinflammatory pulmonary fibrosis, stable.   

## 2020-07-01 NOTE — Progress Notes (Signed)
Location:    [facility] Room Number: 15 Place of Service:  ALF [phone] Provider: Chipper OmanManXie Mast NP  Mahlon GammonGupta, Anjali L, MD  Patient Care Team: Mahlon GammonGupta, Anjali L, MD as PCP - General (Internal Medicine) Susa GriffinsWeintraub, Richard, MD (Inactive) (Cardiology)  Extended Emergency Contact Information Primary Emergency Contact: Odelia Gageunaway,Sandy  [place] Home Phone: [phone] Relation: Friend Secondary Emergency Contact: Florian BuffErnest,Sue  [place] Home Phone: [phone] Relation: Friend  Code Status:  DNR Goals of care: Advanced Directive information Advanced Directives 06/28/2020  Does Patient Have a Medical Advance Directive? -  Type of Estate agentAdvance Directive Healthcare Power of Hob[place]orney;Living will  Does patient want to make changes to medical advance directive? No - Patient declined  Copy of Healthcare Power of Attorney in Chart? Yes - validated most recent copy scanned in chart (See row information)  Would patient like information on creating a medical advance directive? -  Pre-existing out of facility DNR order (yellow form or pink MOST form) -     Chief Complaint  Patient presents with  . Acute Visit    Medication review     HPI:  Pt is a 86 y.o. female seen today for an acute visit for medication review    Hospitalized 06/26/20-06/28/20 for suspected seizure, f/u neurology 5-8 weeks. The patient's presentation: left gaze deviation, confusion after being found unresponsive, nonspecific EEG,  against AED, negative CT, MRI no acute anormality, [facility]. May consider starting Keppra 25mg  bid if another seizure occurs.   Renovascular HTN, Metoprolol, Diovan. HCTZ held for normotension, [facility] in setting of HR 106 bpm at rest upon my examination.   Carotid stenosis US carotid artery: <40% internal carotid artery stenosis bilaterally, s/p endarterectomy.   CAD, PVD, takes ASA, Rosuvastatin  Hyperlipidemia, takes  Rosuvastatin, LDL 70 12/25/19  Postinflammatory pulmonary fibrosis, stable.   CKD Bun/creat 13/0.85 06/27/20  Dementia, takes Memantine.   Dysphagia, diet per ST recommendation  Constipation, takes Senokot S, prn Bisacodyl suppository  GERD, takes Pantoprazole,  Famotidine  OA, general, takes Tylenol 650mg  qhs.   RLE, takes Requip  Past Medical History:  Diagnosis Date  . Anemia   . Carotid arterial disease (HCC)    asymptomatic   . Chest tightness   . Coronary artery disease   . High blood pressure   . Leg pain    with walking  . Palpitations    Past Surgical History:  Procedure Laterality Date  . CARDIAC CATHETERIZATION  07/15/04   noncritical CAD,80% PLA treated medically. EF% 60. no RAS.  [name]. CAROTID ENDARTERECTOMY     RIGHT=05/11/11, LEFT=03/30/11  . EYE SURGERY  2011   Cataract surgery Bilateral  . EYE SURGERY  2011   Bilateral eyelid surgery for ptosis  . renal artery duplex  02/23/13   ABDOMINAL AORTA:<50% DIAMETER REDUCTION. RIGHT RENAL ARTERY: 60-99% DIAMETER REDUCTION.[name]. LEFT RENAL ARTERY:1-59% DIAMETER REDUCTION.    Allergies  Allergen Reactions  . Cardura [Doxazosin Mesylate] Other (See Comments)    Reaction not recalled by the patient  . Lotensin [Benazepril Hcl] Other (See Comments)    Reaction not recalled by the patient  . Tramadol Nausea And Vomiting    Allergies as of 07/01/2020      Reactions   Cardura [doxazosin Mesylate] Other (See Comments)   Reaction not recalled by the patient   Lotensin [benazepril Hcl] Other (See Comments)   Reaction not recalled by the patient   Tramadol Nausea  And Vomiting      Medication List       Accurate as of July 01, 2020 11:59 PM. If you have any questions, ask your nurse or doctor.        acetaminophen 325 MG tablet Commonly known as: TYLENOL Take 650 mg by mouth at bedtime.   aspirin EC 81 MG tablet Take 1 tablet (81 mg total) by mouth daily.   bisacodyl 10 MG suppository Commonly known as:  DULCOLAX Place 10 mg rectally daily as needed for moderate constipation.   famotidine 20 MG tablet Commonly known as: PEPCID TAKE 1 TABLET BY MOUTH EVERYDAY AT BEDTIME What changed: See the new instructions.   memantine 5 MG tablet Commonly known as: NAMENDA Take 5 mg by mouth 2 (two) times daily.   metoprolol succinate 50 MG 24 hr tablet Commonly known as: TOPROL-XL Take 50 mg by mouth in the morning and at bedtime. Take with or immediately following a meal.   pantoprazole 40 MG tablet Commonly known as: PROTONIX TAKE 1 TABLET (40 MG TOTAL) BY MOUTH DAILY. TAKE 30-60 MIN BEFORE FIRST MEAL OF THE DAY What changed: additional instructions   rOPINIRole 1 MG tablet Commonly known as: REQUIP Take 1 mg by mouth every evening.   rosuvastatin 10 MG tablet Commonly known as: CRESTOR Take 10 mg by mouth at bedtime.   senna-docusate 8.6-50 MG tablet Commonly known as: Senokot-S Take 2 tablets by mouth at bedtime.   valsartan-hydrochlorothiazide 160-25 MG tablet Commonly known as: DIOVAN-HCT Take 1 tablet by mouth daily.   Vitamin D3 25 MCG tablet Commonly known as: Vitamin D Take 1,000 Units by mouth daily.       Review of Systems  Constitutional: Negative for activity change, appetite change and fever.  HENT: Positive for hearing loss. Negative for congestion and trouble swallowing.   Eyes: Negative for visual disturbance.  Respiratory: Negative for cough, shortness of breath and wheezing.   Cardiovascular: Negative for chest pain, palpitations and leg swelling.  Gastrointestinal: Negative for abdominal distention, abdominal pain, constipation, nausea and vomiting.  Genitourinary: Negative for dysuria, frequency and urgency.  Musculoskeletal: Positive for back pain and gait problem.       Uses walker.   Skin: Negative for color change.  Neurological: Negative for dizziness, seizures, syncope, speech difficulty, weakness and headaches.  Psychiatric/Behavioral: Positive  for sleep disturbance. Negative for behavioral problems. The patient is not nervous/anxious.        Did not sleep well last night due to the noise in the hallway    Immunization History  Administered Date(s) Administered  . Influenza Split 04/19/2014, 04/20/2015  . Influenza Whole 04/19/2016  . Influenza,inj,Quad PF,6+ Mos 04/14/2018  . Influenza-Unspecified 04/23/2020  . Moderna Sars-Covid-2 Vaccination 07/24/2019, 08/21/2019, 06/03/2020  . Pneumococcal Conjugate-13 08/23/2014  . Tdap 09/21/2017   Pertinent  Health Maintenance Due  Topic Date Due  . PNA vac Low Risk Adult (2 of 2 - PPSV23) 08/24/2015  . INFLUENZA VACCINE  Completed  . DEXA SCAN  Completed   Fall Risk  03/24/2016  Falls in the past year? No  Comment Emmi Telephone Survey: data to providers prior to load   Functional Status Survey:    Vitals:   07/01/20 1108  BP: (!) 165/88  Pulse: 70  Resp: 18  Temp: (!) 97.1 F (36.2 C)  SpO2: 91%  Weight: 95 lb (43.1 kg)  Height: 5\' 5"  (1.651 m)   Body mass index is 15.81 kg/m. Physical Exam Vitals and nursing note  reviewed.  Constitutional:      Appearance: Normal appearance.  HENT:     Head: Normocephalic and atraumatic.     Nose: Nose normal.     Mouth/Throat:     Mouth: Mucous membranes are moist.  Eyes:     Extraocular Movements: Extraocular movements intact.     Conjunctiva/sclera: Conjunctivae normal.     Pupils: Pupils are equal, round, and reactive to light.  Cardiovascular:     Rate and Rhythm: Regular rhythm. Tachycardia present.     Heart sounds: No murmur heard.   Pulmonary:     Effort: Pulmonary effort is normal.     Breath sounds: Rales present. No wheezing or rhonchi.     Comments: Bibasilar rales.  Abdominal:     General: Bowel sounds are normal.     Palpations: Abdomen is soft.     Tenderness: There is no abdominal tenderness.  Musculoskeletal:     Cervical back: Normal range of motion and neck supple.     Right lower leg: No  edema.     Left lower leg: No edema.     Comments: R+L SIJ aches is better controlled on Tylenol   Skin:    General: Skin is warm and dry.  Neurological:     General: No focal deficit present.     Mental Status: She is alert. Mental status is at baseline.     Motor: No weakness.     Coordination: Coordination normal.     Gait: Gait abnormal.     Comments: Oriented to person, place. Furniture, rails walking sometimes.  Psychiatric:        Mood and Affect: Mood normal.        Behavior: Behavior normal.     Labs reviewed: Recent Labs    04/09/20 0000 06/26/20 0830 06/27/20 0413 06/27/20 1729  NA 138 134* 135  --   K 4.1 3.9 3.0*  --   CL 97* 93* 95*  --   CO2 33* 29 25  --   GLUCOSE  --  130* 92 291*  BUN 25* 21 13  --   CREATININE 0.9 0.89 0.85  --   CALCIUM 10.5 10.0 9.5  --    Recent Labs    11/09/19 1441 03/28/20 0000 04/09/20 0000 06/26/20 0830 06/27/20 0413  AST 28   < > 18 23 35  ALT 54*   < > ALKPHOS 82   < > 70 60 56  BILITOT 0.7  --   --  0.8 1.5*  PROT 6.4*  --   --  6.5 6.1*  ALBUMIN 2.8*   < > 4.1 3.9 3.5   < > = values in this interval not displayed.   Recent Labs    11/09/19 1441 03/28/20 0000 04/09/20 0000 06/26/20 0830 06/27/20 0413  WBC 10.0   < > 7.2 12.6* 10.7*  NEUTROABS 7.7  --   --  11.2*  --   HGB 11.4*   < > 14.3 14.1 14.2  HCT 36.3  --  43 42.4 40.3  MCV 91.9  --   --  94.2 90.6  PLT 299  --  198 193 155   < > = values in this interval not displayed.   Lab Results  Component Value Date   TSH 2.130 11/02/2019   Lab Results  Component Value Date   HGBA1C 6.3 (H) 11/02/2019   No results found for: CHOL, HDL, LDLCALC, LDLDIRECT, TRIG, CHOLHDL  Significant Diagnostic Results in last 30 days:  EEG  Result Date: 06/26/2020 Charlsie Quest, MD     06/26/2020 12:29 PM Patient Name: SHARLIE SHREFFLER MRN: [id number] Epilepsy Attending: Charlsie Quest Referring Physician/Provider: Dr. Jonah Blue Date: 06/26/1020  Duration: 25.23 mins Patient history: 86 year old female with new onset seizure-like episode.  EEG to evaluate for seizures. Level of alertness: Awake AEDs during EEG study: Versed Technical aspects: This EEG study was done with scalp electrodes positioned according to the 10-20 International system of electrode placement. Electrical activity was acquired at a sampling rate of 500Hz  and reviewed with a high frequency filter of 70Hz  and a low frequency filter of 1Hz . EEG data were recorded continuously and digitally stored. Description: No posterior dominant rhythm was seen. EEG showed continuous generalized 3 to 6 Hz theta-delta slowing. Hyperventilation and photic stimulation were not performed.   Of note, EEG was technically difficult due to significant myogenic electrode artifact. ABNORMALITY -Continuous slow, generalized IMPRESSION: This technically difficult study is suggestive of moderate diffuse encephalopathy, nonspecific etiology but could be secondary to postictal state.  No seizures or epileptiform discharges were seen throughout the recording.   CT HEAD WO CONTRAST  Result Date: 06/26/2020 CLINICAL DATA:  Found unresponsive, seizure activity EXAM: CT HEAD WITHOUT CONTRAST TECHNIQUE: Contiguous axial images were obtained from the base of the skull through the vertex without intravenous contrast. COMPARISON:  11/09/2019 FINDINGS: Brain: There is no acute intracranial hemorrhage, mass effect, or edema. Gray-white differentiation is preserved. There is no extra-axial fluid collection. Prominence of the ventricles and sulci reflects stable parenchymal volume loss. Patchy hypoattenuation in the supratentorial white matter is nonspecific but probably reflects stable chronic microvascular ischemic changes. Vascular: There is atherosclerotic calcification at the skull base. Skull: Calvarium is unremarkable. Sinuses/Orbits: Polypoid right maxillary sinus mucosal thickening. Other: None.  IMPRESSION: No acute intracranial abnormality. Stable chronic/nonemergent findings detailed above. Electronically Signed   By: Charlsie Quest M.D.   On: 06/26/2020 08:19   CT Cervical Spine Wo Contrast  Result Date: 06/26/2020 CLINICAL DATA:  Found unresponsive, seizure activity EXAM: CT CERVICAL SPINE WITHOUT CONTRAST TECHNIQUE: Multidetector CT imaging of the cervical spine was performed without intravenous contrast. Multiplanar CT image reconstructions were also generated. COMPARISON:  None. FINDINGS: Alignment: Trace retrolisthesis at C5-C6. Skull base and vertebrae: No acute cervical spine fracture. Vertebral body heights are maintained. Soft tissues and spinal canal: No prevertebral fluid or swelling. No visible canal hematoma. Disc levels: Degenerative changes are present greatest at C5-C6 and C6-C7. Upper chest: Scarring at the lung apices. Other: Calcified plaque at the common carotid bifurcations. IMPRESSION: No acute cervical spine fracture. Electronically Signed   By: Guadlupe Spanish M.D.   On: 06/26/2020 08:23   DG Chest Portable 1 View  Result Date: 06/26/2020 CLINICAL DATA:  Altered mental status. EXAM: PORTABLE CHEST 1 VIEW COMPARISON:  November 16, 2016. FINDINGS: The heart size and mediastinal contours are within normal limits. No pneumothorax or pleural effusion is noted. Stable interstitial densities are noted throughout both lungs most consistent with chronic interstitial lung disease or fibrosis, but acute superimposed inflammation or edema cannot be excluded. The visualized skeletal structures are unremarkable. IMPRESSION: Stable interstitial densities are noted throughout both lungs most consistent with chronic interstitial lung disease or fibrosis, but acute superimposed inflammation or edema cannot be excluded. Aortic Atherosclerosis (ICD10-I70.0). Electronically Signed   By: 14/02/2020 M.D.   On: 06/26/2020 10:53    Assessment/Plan New onset seizure without head trauma  (  HCC) Hospitalized 06/26/20-06/28/20 for suspected seizure, f/u neurology 5-8 weeks. The patient's presentation: left gaze deviation, confusion after being found unresponsive, nonspecific EEG,  against AED, negative CT, MRI no acute anormality, admitted to SNF [facility] for therapy. May consider starting Keppra 25mg  bid if another seizure occurs.    Renovascular hypertension Renovascular HTN, Metoprolol, Diovan. HCTZ held for normotension   Carotid stenosis Carotid stenosis carotid artery: <40% internal carotid artery stenosis bilaterally, s/p endarterectomy.    Coronary artery disease CAD, PVD, takes ASA, Rosuvastatin   Hyperlipidemia Hyperlipidemia, takes Rosuvastatin  Postinflammatory pulmonary fibrosis (HCC) Postinflammatory pulmonary fibrosis, stable.   CKD (chronic kidney disease) stage 3, GFR 30-59 ml/min (HCC) CKD Bun/creat 13/0.85 06/27/20   Mixed dementia (HCC) Dementia, takes Memantine.    Dysphagia Dysphagia, diet per ST recommendation   Slow transit constipation Constipation, takes Senokot S, prn Bisacodyl suppository   GERD (gastroesophageal reflux disease) GERD, takes Pantoprazole,  Famotidine  Lower back pain OA, general, takes Tylenol 650mg  qhs.    Restless leg syndrome Stable, takes Requip  Tachycardia Renovascular HTN, Metoprolol, Diovan. HCTZ held for normotension, elevated Bp at [facility] in setting of HR 106 bpm at rest upon my examination. Will resume Metoprolol 25mg  bid.    Essential hypertension vs Renovascular hbp Renovascular HTN, Metoprolol, Diovan. HCTZ held for normotension, elevated Bp at [facility] in setting of HR 106 bpm, Bp 168/90  at rest upon my examination.  Will resume Metoprolol 25mg  bid.     Family/ staff Communication: plan of care reviewed with the patient and charge nurse.   Labs/tests ordered:  none  Time spend 35 minutes.

## 2020-07-01 NOTE — Assessment & Plan Note (Signed)
CAD, PVD, takes ASA, Rosuvastatin 

## 2020-07-01 NOTE — Assessment & Plan Note (Signed)
Hospitalized 06/26/20-06/28/20 for suspected seizure, f/u neurology 5-8 weeks. The patient's presentation: left gaze deviation, confusion after being found unresponsive, nonspecific EEG,  against AED, negative CT, MRI no acute anormality, admitted to SNF [facility] for therapy. May consider starting Keppra 25mg  bid if another seizure occurs.

## 2020-07-02 ENCOUNTER — Encounter: Payer: Self-pay | Admitting: Nurse Practitioner

## 2020-07-02 ENCOUNTER — Non-Acute Institutional Stay (SKILLED_NURSING_FACILITY): Payer: Medicare Other | Admitting: Nurse Practitioner

## 2020-07-02 ENCOUNTER — Encounter: Payer: Self-pay | Admitting: Internal Medicine

## 2020-07-02 DIAGNOSIS — I2510 Atherosclerotic heart disease of native coronary artery without angina pectoris: Secondary | ICD-10-CM

## 2020-07-02 DIAGNOSIS — I6523 Occlusion and stenosis of bilateral carotid arteries: Secondary | ICD-10-CM | POA: Diagnosis not present

## 2020-07-02 DIAGNOSIS — G309 Alzheimer's disease, unspecified: Secondary | ICD-10-CM | POA: Diagnosis not present

## 2020-07-02 DIAGNOSIS — I150 Renovascular hypertension: Secondary | ICD-10-CM | POA: Diagnosis not present

## 2020-07-02 DIAGNOSIS — R1310 Dysphagia, unspecified: Secondary | ICD-10-CM | POA: Diagnosis not present

## 2020-07-02 DIAGNOSIS — F0280 Dementia in other diseases classified elsewhere without behavioral disturbance: Secondary | ICD-10-CM

## 2020-07-02 DIAGNOSIS — K5901 Slow transit constipation: Secondary | ICD-10-CM

## 2020-07-02 DIAGNOSIS — N1832 Chronic kidney disease, stage 3b: Secondary | ICD-10-CM | POA: Diagnosis not present

## 2020-07-02 DIAGNOSIS — R569 Unspecified convulsions: Secondary | ICD-10-CM

## 2020-07-02 DIAGNOSIS — M545 Low back pain, unspecified: Secondary | ICD-10-CM

## 2020-07-02 DIAGNOSIS — F0150 Vascular dementia without behavioral disturbance: Secondary | ICD-10-CM

## 2020-07-02 DIAGNOSIS — J8410 Pulmonary fibrosis, unspecified: Secondary | ICD-10-CM

## 2020-07-02 DIAGNOSIS — K219 Gastro-esophageal reflux disease without esophagitis: Secondary | ICD-10-CM

## 2020-07-02 DIAGNOSIS — R000 Tachycardia, unspecified: Secondary | ICD-10-CM | POA: Diagnosis not present

## 2020-07-02 DIAGNOSIS — E782 Mixed hyperlipidemia: Secondary | ICD-10-CM | POA: Diagnosis not present

## 2020-07-02 DIAGNOSIS — G2581 Restless legs syndrome: Secondary | ICD-10-CM

## 2020-07-02 NOTE — Assessment & Plan Note (Signed)
CAD, PVD, takes ASA, Rosuvastatin 

## 2020-07-02 NOTE — Assessment & Plan Note (Signed)
Hospitalized 06/26/20-06/28/20 for suspected seizure, f/u neurology 5-8 weeks. The patient's presentation: left gaze deviation, confusion after being found unresponsive, nonspecific EEG,  against AED, negative CT, MRI no acute anormality, admitted to [facility] for therapy. May consider starting Keppra 25mg bid if another seizure occurs.   

## 2020-07-02 NOTE — Assessment & Plan Note (Signed)
Hyperlipidemia, takes Rosuvastatin, LDL 70 12/25/19 

## 2020-07-02 NOTE — Assessment & Plan Note (Signed)
GERD, takes Pantoprazole, Famotidine 

## 2020-07-02 NOTE — Assessment & Plan Note (Signed)
OA, general, takes Tylenol 650mg qhs. 

## 2020-07-02 NOTE — Assessment & Plan Note (Signed)
Carotid stenosis US carotid artery: <40% internal carotid artery stenosis bilaterally, s/p endarterectomy.

## 2020-07-02 NOTE — Progress Notes (Signed)
Location:   [facility] Room Number: 15 Place of Service:  ALF (13) Provider: Arna Snipe Anette Barra NP  Mahlon Gammon, MD  Patient Care Team: Mahlon Gammon, MD as PCP - General (Internal Medicine) Susa Griffins, MD (Inactive) (Cardiology)  Extended Emergency Contact Information Primary Emergency Contact: Franne Forts [place] Home Phone: [phone] Relation: Friend Secondary Emergency Contact: Florian Buff [place] Home Phone: [phone] Relation: Friend  Code Status:  DNR Goals of care: Advanced Directive information Advanced Directives 06/28/2020  Does Patient Have a Medical Advance Directive? -  Type of Estate agent of [place];Living will  Does patient want to make changes to medical advance directive? No - Patient declined  Copy of Healthcare Power of Attorney in Chart? Yes - validated most recent copy scanned in chart (See row information)  Would patient like information on creating a medical advance directive? -  Pre-existing out of facility DNR order (yellow form or pink MOST form) -     Chief Complaint  Patient presents with  . Acute Visit    F/u rapid heart beats    HPI:  Pt is a 86 y.o. female seen today for an acute visit for f/u tachycardia, HR 106 bpm 07/01/20 at rest upon my examination, resumed Metoprolol 25mg  bid. Stopped Duiovan, Metoprolol, HCTZ in hospital due to normotension.  Hx of tachycardia.     Hospitalized 06/26/20-06/28/20 for suspected seizure, f/u neurology 5-8 weeks. The patient's presentation: left gaze deviation, confusion after being found unresponsive, nonspecific EEG,  against AED, negative CT, MRI no acute anormality, admitted to [facility] for therapy. May consider starting Keppra 25mg  bid if another seizure occurs.              Renovascular HTN, Metoprolol, Diovan. HCTZ held for normotension, elevated Bp at [facility] in setting of HR 106 bpm at rest upon my examination.               Carotid stenosis [facility] carotid artery: <40% internal carotid artery stenosis bilaterally, s/p endarterectomy.              CAD, PVD, takes ASA, Rosuvastatin             Hyperlipidemia, takes Rosuvastatin, LDL 70 12/25/19             Postinflammatory pulmonary fibrosis, stable.              CKD Bun/creat 13/0.85 06/27/20             Dementia, takes Memantine.              Dysphagia, diet per ST recommendation             Constipation, takes Senokot S, prn Bisacodyl suppository             GERD, takes Pantoprazole,  Famotidine             OA, general, takes Tylenol 650mg  qhs.              RLE, takes Requip    Past Medical History:  Diagnosis Date  . Anemia   . Carotid arterial disease (HCC)    asymptomatic   . Chest tightness   . Coronary artery disease   . High blood pressure   . Leg pain    with walking  . Palpitations    Past Surgical History:  Procedure Laterality Date  . CARDIAC CATHETERIZATION  07/15/04   noncritical CAD,80% PLA  treated medically. EF% 60. no RAS.  [name]. CAROTID ENDARTERECTOMY     RIGHT=05/11/11, LEFT=03/30/11  . EYE SURGERY  2011   Cataract surgery Bilateral  . EYE SURGERY  2011   Bilateral eyelid surgery for ptosis  . renal artery duplex  02/23/13   ABDOMINAL AORTA:<50% DIAMETER REDUCTION. RIGHT RENAL ARTERY: 60-99% DIAMETER REDUCTION.[name]. LEFT RENAL ARTERY:1-59% DIAMETER REDUCTION.    Allergies  Allergen Reactions  . Cardura [Doxazosin Mesylate] Other (See Comments)    Reaction not recalled by the patient  . Lotensin [Benazepril Hcl] Other (See Comments)    Reaction not recalled by the patient  . Tramadol Nausea And Vomiting    Allergies as of 07/02/2020      Reactions   Cardura [doxazosin Mesylate] Other (See Comments)   Reaction not recalled by the patient   Lotensin [benazepril Hcl] Other (See Comments)   Reaction not recalled by the patient   Tramadol Nausea And Vomiting      Medication List       Accurate as of July 02, 2020  4:34  PM. If you have any questions, ask your nurse or doctor.        acetaminophen 325 MG tablet Commonly known as: TYLENOL Take 650 mg by mouth at bedtime.   aspirin EC 81 MG tablet Take 1 tablet (81 mg total) by mouth daily.   bisacodyl 10 MG suppository Commonly known as: DULCOLAX Place 10 mg rectally daily as needed for moderate constipation.   famotidine 20 MG tablet Commonly known as: PEPCID TAKE 1 TABLET BY MOUTH EVERYDAY AT BEDTIME What changed: See the new instructions.   memantine 5 MG tablet Commonly known as: NAMENDA Take 5 mg by mouth 2 (two) times daily.   metoprolol succinate 50 MG 24 hr tablet Commonly known as: TOPROL-XL Take 50 mg by mouth in the morning and at bedtime. Take with or immediately following a meal.   pantoprazole 40 MG tablet Commonly known as: PROTONIX TAKE 1 TABLET (40 MG TOTAL) BY MOUTH DAILY. TAKE 30-60 MIN BEFORE FIRST MEAL OF THE DAY What changed: additional instructions   rOPINIRole 1 MG tablet Commonly known as: REQUIP Take 1 mg by mouth every evening.   rosuvastatin 10 MG tablet Commonly known as: CRESTOR Take 10 mg by mouth at bedtime.   senna-docusate 8.6-50 MG tablet Commonly known as: Senokot-S Take 2 tablets by mouth at bedtime.   valsartan-hydrochlorothiazide 160-25 MG tablet Commonly known as: DIOVAN-HCT Take 1 tablet by mouth daily.   Vitamin D3 25 MCG tablet Commonly known as: Vitamin D Take 1,000 Units by mouth daily.       Review of Systems  Constitutional: Negative for activity change, appetite change and fever.  HENT: Positive for hearing loss. Negative for congestion and trouble swallowing.   Eyes: Negative for visual disturbance.  Respiratory: Negative for cough, shortness of breath and wheezing.   Cardiovascular: Negative for chest pain, palpitations and leg swelling.  Gastrointestinal: Negative for abdominal pain and constipation.  Genitourinary: Negative for dysuria, frequency and urgency.   Musculoskeletal: Positive for back pain and gait problem.       Uses walker.   Skin: Negative for color change.  Neurological: Negative for seizures, syncope, speech difficulty, weakness, light-headedness and headaches.  Psychiatric/Behavioral: Negative for behavioral problems and sleep disturbance. The patient is not nervous/anxious.     Immunization History  Administered Date(s) Administered  . Influenza Split 04/19/2014, 04/20/2015  . Influenza Whole 04/19/2016  . Influenza,inj,Quad PF,6+ Mos 04/14/2018  . Influenza-Unspecified  04/23/2020  . Moderna Sars-Covid-2 Vaccination 07/24/2019, 08/21/2019, 06/03/2020  . Pneumococcal Conjugate-13 08/23/2014  . Tdap 09/21/2017   Pertinent  Health Maintenance Due  Topic Date Due  . PNA vac Low Risk Adult (2 of 2 - PPSV23) 08/24/2015  . INFLUENZA VACCINE  Completed  . DEXA SCAN  Completed   Fall Risk  03/24/2016  Falls in the past year? No  Comment Emmi Telephone Survey: data to providers prior to load   Functional Status Survey:    Vitals:   07/02/20 1413  BP: (!) 165/88  Pulse: 70  Resp: 18  Temp: (!) 97.1 F (36.2 C)  SpO2: 91%  Weight: 95 lb (43.1 kg)  Height: 5\' 5"  (1.651 m)   Body mass index is 15.81 kg/m. Physical Exam Vitals and nursing note reviewed.  Constitutional:      Appearance: Normal appearance.  HENT:     Head: Normocephalic and atraumatic.     Mouth/Throat:     Mouth: Mucous membranes are moist.  Eyes:     Extraocular Movements: Extraocular movements intact.     Conjunctiva/sclera: Conjunctivae normal.     Pupils: Pupils are equal, round, and reactive to light.  Cardiovascular:     Rate and Rhythm: Normal rate and regular rhythm.     Heart sounds: No murmur heard.   Pulmonary:     Effort: Pulmonary effort is normal.     Breath sounds: Rales present. No wheezing or rhonchi.     Comments: Bibasilar rales.  Abdominal:     General: Bowel sounds are normal.     Palpations: Abdomen is soft.      Tenderness: There is no abdominal tenderness.  Musculoskeletal:     Cervical back: Normal range of motion and neck supple.     Right lower leg: No edema.     Left lower leg: No edema.     Comments: R+L SIJ aches is better controlled on Tylenol   Skin:    General: Skin is warm and dry.  Neurological:     General: No focal deficit present.     Mental Status: She is alert. Mental status is at baseline.     Motor: No weakness.     Coordination: Coordination normal.     Gait: Gait abnormal.     Comments: Oriented to person, place. Furniture, rails walking sometimes.  Psychiatric:        Mood and Affect: Mood normal.        Behavior: Behavior normal.     Labs reviewed: Recent Labs    04/09/20 0000 06/26/20 0830 06/27/20 0413 06/27/20 1729  NA 138 134* 135  --   K 4.1 3.9 3.0*  --   CL 97* 93* 95*  --   CO2 33* 29 25  --   GLUCOSE  --  130* 92 291*  BUN 25* 21 13  --   CREATININE 0.9 0.89 0.85  --   CALCIUM 10.5 10.0 9.5  --    Recent Labs    11/09/19 1441 03/28/20 0000 04/09/20 0000 06/26/20 0830 06/27/20 0413  AST 28   < > 18 23 35  ALT 54*   < > 22 20 20   ALKPHOS 82   < > 70 60 56  BILITOT 0.7  --   --  0.8 1.5*  PROT 6.4*  --   --  6.5 6.1*  ALBUMIN 2.8*   < > 4.1 3.9 3.5   < > = values in this interval not  displayed.   Recent Labs    11/09/19 1441 03/28/20 0000 04/09/20 0000 06/26/20 0830 06/27/20 0413  WBC 10.0   < > 7.2 12.6* 10.7*  NEUTROABS 7.7  --   --  11.2*  --   HGB 11.4*   < > 14.3 14.1 14.2  HCT 36.3  --  43 42.4 40.3  MCV 91.9  --   --  94.2 90.6  PLT 299  --  198 193 155   < > = values in this interval not displayed.   Lab Results  Component Value Date   TSH 2.130 11/02/2019   Lab Results  Component Value Date   HGBA1C 6.3 (H) 11/02/2019   No results found for: CHOL, HDL, LDLCALC, LDLDIRECT, TRIG, CHOLHDL  Significant Diagnostic Results in last 30 days:  EEG  Result Date: 06/26/2020 Charlsie Quest, MD     06/26/2020 12:29 PM  Patient Name: [name] MRN: [id number] Epilepsy Attending: Charlsie Quest Referring Physician/Provider: Dr. Jonah Blue Date: 06/26/1020 Duration: 25.23 mins Patient history: 86 year old female with new onset seizure-like episode.  EEG to evaluate for seizures. Level of alertness: Awake AEDs during EEG study: Versed Technical aspects: This EEG study was done with scalp electrodes positioned according to the 10-20 International system of electrode placement. Electrical activity was acquired at a sampling rate of  and reviewed with a high frequency filter of  and a low frequency filter of . EEG data were recorded continuously and digitally stored. Description: No posterior dominant rhythm was seen. EEG showed continuous generalized 3 to 6 Hz theta-delta slowing. Hyperventilation and photic stimulation were not performed.   Of note, EEG was technically difficult due to significant myogenic electrode artifact. ABNORMALITY -Continuous slow, generalized IMPRESSION: This technically difficult study is suggestive of moderate diffuse encephalopathy, nonspecific etiology but could be secondary to postictal state.  No seizures or epileptiform discharges were seen throughout the recording. Charlsie Quest   CT HEAD WO CONTRAST  Result Date: 06/26/2020 CLINICAL DATA:  Found unresponsive, seizure activity EXAM: CT HEAD WITHOUT CONTRAST TECHNIQUE: Contiguous axial images were obtained from the base of the skull through the vertex without intravenous contrast. COMPARISON:  11/09/2019 FINDINGS: Brain: There is no acute intracranial hemorrhage, mass effect, or edema. Gray-white differentiation is preserved. There is no extra-axial fluid collection. Prominence of the ventricles and sulci reflects stable parenchymal volume loss. Patchy hypoattenuation in the supratentorial white matter is nonspecific but probably reflects stable chronic microvascular ischemic changes. Vascular: There is atherosclerotic  calcification at the skull base. Skull: Calvarium is unremarkable. Sinuses/Orbits: Polypoid right maxillary sinus mucosal thickening. Other: None. IMPRESSION: No acute intracranial abnormality. Stable chronic/nonemergent findings detailed above. Electronically Signed   By: Guadlupe Spanish M.D.   On: 06/26/2020 08:19   CT Cervical Spine Wo Contrast  Result Date: 06/26/2020 CLINICAL DATA:  Found unresponsive, seizure activity EXAM: CT CERVICAL SPINE WITHOUT CONTRAST TECHNIQUE: Multidetector CT imaging of the cervical spine was performed without intravenous contrast. Multiplanar CT image reconstructions were also generated. COMPARISON:  None. FINDINGS: Alignment: Trace retrolisthesis at C5-C6. Skull base and vertebrae: No acute cervical spine fracture. Vertebral body heights are maintained. Soft tissues and spinal canal: No prevertebral fluid or swelling. No visible canal hematoma. Disc levels: Degenerative changes are present greatest at C5-C6 and C6-C7. Upper chest: Scarring at the lung apices. Other: Calcified plaque at the common carotid bifurcations. IMPRESSION: No acute cervical spine fracture. Electronically Signed   By: Guadlupe Spanish M.D.   On: 06/26/2020 08:23  DG Chest Portable 1 View  Result Date: 06/26/2020 CLINICAL DATA:  Altered mental status. EXAM: PORTABLE CHEST 1 VIEW COMPARISON:  November 16, 2016. FINDINGS: The heart size and mediastinal contours are within normal limits. No pneumothorax or pleural effusion is noted. Stable interstitial densities are noted throughout both lungs most consistent with chronic interstitial lung disease or fibrosis, but acute superimposed inflammation or edema cannot be excluded. The visualized skeletal structures are unremarkable. IMPRESSION: Stable interstitial densities are noted throughout both lungs most consistent with chronic interstitial lung disease or fibrosis, but acute superimposed inflammation or edema cannot be excluded. Aortic Atherosclerosis  (ICD10-I70.0). Electronically Signed   By: Lupita Raider M.D.   On: 06/26/2020 10:53    Assessment/Plan Tachycardia for f/u tachycardia, HR 106 bpm 07/01/20 at rest upon my examination, resumed Metoprolol 25mg  bid. Stopped Duiovan, Metoprolol, HCTZ in hospital due to normotension.  Hx of tachycardia.    New onset seizure without head trauma (HCC) Hospitalized 06/26/20-06/28/20 for suspected seizure, f/u neurology 5-8 weeks. The patient's presentation: left gaze deviation, confusion after being found unresponsive, nonspecific EEG,  against AED, negative CT, MRI no acute anormality, admitted to SNF [facility] for therapy. May consider starting Keppra 25mg  bid if another seizure occurs.    Renovascular hypertension Renovascular HTN, resumed Metoprolol at lower dose of 25mg  bid for HR 106 at rest 07/01/20, Bp 175/78 mmHg 07/02/20, HR 86bpm. , stopped Diovan. HCTZ for normotension while in hospital.   Carotid stenosis Carotid stenosis carotid artery: <40% internal carotid artery stenosis bilaterally, s/p endarterectomy.    CAD (coronary artery disease) CAD, PVD, takes ASA, Rosuvastatin   Hyperlipidemia Hyperlipidemia, takes Rosuvastatin, LDL 70 12/25/19   Postinflammatory pulmonary fibrosis (HCC) Postinflammatory pulmonary fibrosis, stable.   CKD (chronic kidney disease) stage 3, GFR 30-59 ml/min (HCC) CKD Bun/creat 13/0.85 06/27/20  Mixed dementia (HCC) Dementia, takes Memantine.    Dysphagia Dysphagia, diet per ST recommendation   Slow transit constipation Constipation, takes Senokot S, prn Bisacodyl suppository  GERD (gastroesophageal reflux disease) GERD, takes Pantoprazole,  Famotidine  Lower back pain OA, general, takes Tylenol 650mg  qhs.    Restless leg syndrome  RLE, takes Requip       Family/ staff Communication: plan of care reviewed with the patient and charge nurse.   Labs/tests ordered:  none  Time spend 35 minutes.

## 2020-07-02 NOTE — Progress Notes (Signed)
This encounter was created in error - please disregard.

## 2020-07-02 NOTE — Assessment & Plan Note (Signed)
for f/u tachycardia, HR 106 bpm 07/01/20 at rest upon my examination, resumed Metoprolol 25mg  bid. Stopped Duiovan, Metoprolol, HCTZ in hospital due to normotension.  Hx of tachycardia.

## 2020-07-02 NOTE — Assessment & Plan Note (Signed)
CKD Bun/creat 13/0.85 06/27/20

## 2020-07-02 NOTE — Assessment & Plan Note (Addendum)
Renovascular HTN, resumed Metoprolol at lower dose of 25mg  bid for HR 106 at rest 07/01/20, Bp 175/78 mmHg 07/02/20, HR 86bpm. , stopped Diovan. HCTZ for normotension while in hospital.

## 2020-07-02 NOTE — Assessment & Plan Note (Signed)
Dysphagia, diet per ST recommendation  

## 2020-07-02 NOTE — Assessment & Plan Note (Signed)
Constipation, takes Senokot S, prn Bisacodyl suppository  

## 2020-07-02 NOTE — Assessment & Plan Note (Signed)
Dementia, takes Memantine.    

## 2020-07-02 NOTE — Assessment & Plan Note (Signed)
Postinflammatory pulmonary fibrosis, stable.   

## 2020-07-02 NOTE — Assessment & Plan Note (Signed)
RLE, takes Requip   

## 2020-07-03 ENCOUNTER — Non-Acute Institutional Stay (SKILLED_NURSING_FACILITY): Payer: Medicare Other | Admitting: Internal Medicine

## 2020-07-03 ENCOUNTER — Encounter: Payer: Self-pay | Admitting: Internal Medicine

## 2020-07-03 DIAGNOSIS — N1832 Chronic kidney disease, stage 3b: Secondary | ICD-10-CM

## 2020-07-03 DIAGNOSIS — G309 Alzheimer's disease, unspecified: Secondary | ICD-10-CM

## 2020-07-03 DIAGNOSIS — E782 Mixed hyperlipidemia: Secondary | ICD-10-CM

## 2020-07-03 DIAGNOSIS — F0150 Vascular dementia without behavioral disturbance: Secondary | ICD-10-CM | POA: Diagnosis not present

## 2020-07-03 DIAGNOSIS — I150 Renovascular hypertension: Secondary | ICD-10-CM | POA: Diagnosis not present

## 2020-07-03 DIAGNOSIS — R569 Unspecified convulsions: Secondary | ICD-10-CM | POA: Diagnosis not present

## 2020-07-03 DIAGNOSIS — F0280 Dementia in other diseases classified elsewhere without behavioral disturbance: Secondary | ICD-10-CM

## 2020-07-03 DIAGNOSIS — R1310 Dysphagia, unspecified: Secondary | ICD-10-CM

## 2020-07-03 NOTE — Progress Notes (Signed)
Provider:   Location:  [facility] Room Number: 28 Place of Service:  SNF (31)  PCP: Mahlon Gammon, MD Patient Care Team: Mahlon Gammon, MD as PCP - General (Internal Medicine) Susa Griffins, MD (Inactive) (Cardiology)  Extended Emergency Contact Information Primary Emergency Contact: Odelia Gage of [place] Home Phone: [phone] Relation: Friend Secondary Emergency Contact: Florian Buff [place] of [place] Home Phone: [phone] Relation: Friend  Code Status: Goals of Care: Advanced Directive information Advanced Directives 06/28/2020  Does Patient Have a Medical Advance Directive? -  Type of Estate agent of [place];Living will  Does patient want to make changes to medical advance directive? No - Patient declined  Copy of Healthcare Power of Attorney in Chart? Yes - validated most recent copy scanned in chart (See row information)  Would patient like information on creating a medical advance directive? -  Pre-existing out of facility DNR order (yellow form or pink MOST form) -      Chief Complaint  Patient presents with  . New Admit To SNF    Admission to SNF    HPI: Patient is a 86 y.o. female seen today for admission to SNF for therapy  Admitted in hospital from 12/08 - 12/10 for Possible seizure.   Patient has h/o Hypertension, Hyperlipidemia, CAD, MVP, Restless Leg, Hiatal Hernia, Osteoporosisand Arthritis  Was send to the hospital for Seizure like activity seen by Facility Nurse CT scan was negative for any acute changes. Noin Specific EEG Neurology said to hold Anti seizure meds. Consider if anymore seizures No Signs of infection noticed by Hospital Discharged to SNF Has been very confused. Trying to leave the facility Does not know why she can go back to her house Talking about her husband who has passed away. Walking with her walker. Staying independent in her ADLS    Past  Medical History:  Diagnosis Date  . Anemia   . Carotid arterial disease (HCC)    asymptomatic   . Chest tightness   . Coronary artery disease   . High blood pressure   . Leg pain    with walking  . Palpitations    Past Surgical History:  Procedure Laterality Date  . CARDIAC CATHETERIZATION  07/15/04   noncritical CAD,80% PLA treated medically. EF% 60. no RAS.  [name] CAROTID ENDARTERECTOMY     RIGHT=05/11/11, LEFT=03/30/11  . EYE SURGERY  2011   Cataract surgery Bilateral  . EYE SURGERY  2011   Bilateral eyelid surgery for ptosis  . renal artery duplex  02/23/13   ABDOMINAL AORTA:<50% DIAMETER REDUCTION. RIGHT RENAL ARTERY: 60-99% DIAMETER REDUCTION.[name] LEFT RENAL ARTERY:1-59% DIAMETER REDUCTION.    reports that she has never smoked. She has never used smokeless tobacco. She reports that she does not drink alcohol and does not use drugs. Social History   Socioeconomic History  . Marital status: Widowed    Spouse name: Not on file  . Number of children: Not on file  . Years of education: Not on file  . Highest education level: Not on file  Occupational History  . Not on file  Tobacco Use  . Smoking status: Never Smoker  . Smokeless tobacco: Never Used  Substance and Sexual Activity  . Alcohol use: No  . Drug use: No  . Sexual activity: Not on file  Other Topics Concern  . Not on file  Social History Narrative  . Not on file   Social Determinants of Health  Financial Resource Strain: Not on file  Food Insecurity: Not on file  Transportation Needs: Not on file  Physical Activity: Not on file  Stress: Not on file  Social Connections: Not on file  Intimate Partner Violence: Not on file    Functional Status Survey:    Family History  Problem Relation Age of Onset  . Lung disease Mother   . Heart disease Father   . Cancer Sister        liver cancer  . Heart disease Brother     Health Maintenance  Topic Date Due  . PNA vac Low Risk Adult (2 of 2 - PPSV23)  08/24/2015  . TETANUS/TDAP  09/22/2027  . INFLUENZA VACCINE  Completed  . DEXA SCAN  Completed  . COVID-19 Vaccine  Completed    Allergies  Allergen Reactions  . Cardura [Doxazosin Mesylate] Other (See Comments)    Reaction not recalled by the patient  . Lotensin [Benazepril Hcl] Other (See Comments)    Reaction not recalled by the patient  . Tramadol Nausea And Vomiting    Outpatient Encounter Medications as of 07/03/2020  Medication Sig  . acetaminophen (TYLENOL) 325 MG tablet Take 650 mg by mouth at bedtime.  [name] aspirin EC 81 MG tablet Take 1 tablet (81 mg total) by mouth daily.  . bisacodyl (DULCOLAX) 10 MG suppository Place 10 mg rectally daily as needed for moderate constipation.  . famotidine (PEPCID) 20 MG tablet TAKE 1 TABLET BY MOUTH EVERYDAY AT BEDTIME (Patient taking differently: Take 20 mg by mouth at bedtime.)  . memantine (NAMENDA) 5 MG tablet Take 5 mg by mouth 2 (two) times daily.  . metoprolol succinate (TOPROL-XL) 50 MG 24 hr tablet Take 50 mg by mouth in the morning and at bedtime. Take with or immediately following a meal.  . pantoprazole (PROTONIX) 40 MG tablet TAKE 1 TABLET (40 MG TOTAL) BY MOUTH DAILY. TAKE 30-60 MIN BEFORE FIRST MEAL OF THE DAY (Patient taking differently: Take 40 mg by mouth daily.)  . rOPINIRole (REQUIP) 1 MG tablet Take 1 mg by mouth every evening.  . rosuvastatin (CRESTOR) 10 MG tablet Take 10 mg by mouth at bedtime.   . senna-docusate (SENOKOT-S) 8.6-50 MG tablet Take 2 tablets by mouth at bedtime.   . Vitamin D3 (VITAMIN D) 25 MCG tablet Take 1,000 Units by mouth daily.  [name] zinc oxide 20 % ointment Apply 1 application topically as needed for irritation.  . [DISCONTINUED] valsartan-hydrochlorothiazide (DIOVAN-HCT) 160-25 MG tablet Take 1 tablet by mouth daily.   No facility-administered encounter medications on file as of 07/03/2020.    Review of Systems  Denies everything but very confused   Vitals:   07/03/20 1026  BP: (!) 177/78   Pulse: 76  Resp: 18  Temp: (!) 97.4 F (36.3 C)  SpO2: 94%  Weight: 98 lb 9.6 oz (44.7 kg)  Height: 5\' 2"  (1.575 m)   Body mass index is 18.03 kg/m. Physical Exam  Constitutional: . Well-developed and well-nourished.  HENT:  Head: Normocephalic.  Mouth/Throat: Oropharynx is clear and moist.  Eyes: Pupils are equal, round, and reactive to light.  Neck: Neck supple.  Cardiovascular: Normal rate and normal heart sounds.  No murmur heard. Pulmonary/Chest: Effort normal and breath sounds normal. No respiratory distress. No wheezes. She has no rales.  Abdominal: Soft. Bowel sounds are normal. No distension. There is no tenderness. There is no rebound.  Musculoskeletal: No edema.  Lymphadenopathy: none Neurological: No Focal Deficits Skin: Skin is warm  and dry.  Psychiatric: Anxious and Confused    Labs reviewed: Basic Metabolic Panel: Recent Labs    04/09/20 0000 06/26/20 0830 06/27/20 0413 06/27/20 1729  NA 138 134* 135  --   K 4.1 3.9 3.0*  --   CL 97* 93* 95*  --   CO2 33* 29 25  --   GLUCOSE  --  130* 92 291*  BUN 25* 21 13  --   CREATININE 0.9 0.89 0.85  --   CALCIUM 10.5 10.0 9.5  --    Liver Function Tests: Recent Labs    11/09/19 1441 03/28/20 0000 04/09/20 0000 06/26/20 0830 06/27/20 0413  AST 28   < > 18 23 35  ALT 54*   < > 22 20 20   ALKPHOS 82   < > 70 60 56  BILITOT 0.7  --   --  0.8 1.5*  PROT 6.4*  --   --  6.5 6.1*  ALBUMIN 2.8*   < > 4.1 3.9 3.5   < > = values in this interval not displayed.   No results for input(s): LIPASE, AMYLASE in the last 8760 hours. No results for input(s): AMMONIA in the last 8760 hours. CBC: Recent Labs    11/09/19 1441 03/28/20 0000 04/09/20 0000 06/26/20 0830 06/27/20 0413  WBC 10.0   < > 7.2 12.6* 10.7*  NEUTROABS 7.7  --   --  11.2*  --   HGB 11.4*   < > 14.3 14.1 14.2  HCT 36.3  --  43 42.4 40.3  MCV 91.9  --   --  94.2 90.6  PLT 299  --  198 193 155   < > = values in this interval not displayed.    Cardiac Enzymes: No results for input(s): CKTOTAL, CKMB, CKMBINDEX, TROPONINI in the last 8760 hours. BNP: Invalid input(s): POCBNP Lab Results  Component Value Date   HGBA1C 6.3 (H) 11/02/2019   Lab Results  Component Value Date   TSH 2.130 11/02/2019   Lab Results  Component Value Date   VITAMINB12 1,386 (H) 11/02/2019   Lab Results  Component Value Date   FOLATE 11.5 11/02/2019   No results found for: IRON, TIBC, FERRITIN  Imaging and Procedures obtained prior to SNF admission: EEG  Result Date: 06/26/2020 Charlsie QuestYadav, Priyanka O, MD     06/26/2020 12:29 PM Patient Name: Lindsay Sicklernestine C Mcgurn MRN: [id number] Epilepsy Attending: Charlsie QuestPriyanka O Yadav Referring Physician/Provider: Dr. Jonah BlueJennifer Yates Date: 06/26/1020 Duration: 25.23 mins Patient history: 86 year old female with new onset seizure-like episode.  EEG to evaluate for seizures. Level of alertness: Awake AEDs during EEG study: Versed Technical aspects: This EEG study was done with scalp electrodes positioned according to the 10-20 International system of electrode placement. Electrical activity was acquired at a sampling rate of 500Hz  and reviewed with a high frequency filter of 70Hz  and a low frequency filter of 1Hz . EEG data were recorded continuously and digitally stored. Description: No posterior dominant rhythm was seen. EEG showed continuous generalized 3 to 6 Hz theta-delta slowing. Hyperventilation and photic stimulation were not performed.   Of note, EEG was technically difficult due to significant myogenic electrode artifact. ABNORMALITY -Continuous slow, generalized IMPRESSION: This technically difficult study is suggestive of moderate diffuse encephalopathy, nonspecific etiology but could be secondary to postictal state.  No seizures or epileptiform discharges were seen throughout the recording. Charlsie QuestPriyanka O Yadav   CT HEAD WO CONTRAST  Result Date: 06/26/2020 CLINICAL DATA:  Found unresponsive, seizure activity EXAM: CT  HEAD  WITHOUT CONTRAST TECHNIQUE: Contiguous axial images were obtained from the base of the skull through the vertex without intravenous contrast. COMPARISON:  11/09/2019 FINDINGS: Brain: There is no acute intracranial hemorrhage, mass effect, or edema. Gray-white differentiation is preserved. There is no extra-axial fluid collection. Prominence of the ventricles and sulci reflects stable parenchymal volume loss. Patchy hypoattenuation in the supratentorial white matter is nonspecific but probably reflects stable chronic microvascular ischemic changes. Vascular: There is atherosclerotic calcification at the skull base. Skull: Calvarium is unremarkable. Sinuses/Orbits: Polypoid right maxillary sinus mucosal thickening. Other: None. IMPRESSION: No acute intracranial abnormality. Stable chronic/nonemergent findings detailed above. Electronically Signed   By: Guadlupe Spanish M.D.   On: 06/26/2020 08:19   CT Cervical Spine Wo Contrast  Result Date: 06/26/2020 CLINICAL DATA:  Found unresponsive, seizure activity EXAM: CT CERVICAL SPINE WITHOUT CONTRAST TECHNIQUE: Multidetector CT imaging of the cervical spine was performed without intravenous contrast. Multiplanar CT image reconstructions were also generated. COMPARISON:  None. FINDINGS: Alignment: Trace retrolisthesis at C5-C6. Skull base and vertebrae: No acute cervical spine fracture. Vertebral body heights are maintained. Soft tissues and spinal canal: No prevertebral fluid or swelling. No visible canal hematoma. Disc levels: Degenerative changes are present greatest at C5-C6 and C6-C7. Upper chest: Scarring at the lung apices. Other: Calcified plaque at the common carotid bifurcations. IMPRESSION: No acute cervical spine fracture. Electronically Signed   By: Guadlupe Spanish M.D.   On: 06/26/2020 08:23   DG Chest Portable 1 View  Result Date: 06/26/2020 CLINICAL DATA:  Altered mental status. EXAM: PORTABLE CHEST 1 VIEW COMPARISON:  November 16, 2016. FINDINGS: The heart  size and mediastinal contours are within normal limits. No pneumothorax or pleural effusion is noted. Stable interstitial densities are noted throughout both lungs most consistent with chronic interstitial lung disease or fibrosis, but acute superimposed inflammation or edema cannot be excluded. The visualized skeletal structures are unremarkable. IMPRESSION: Stable interstitial densities are noted throughout both lungs most consistent with chronic interstitial lung disease or fibrosis, but acute superimposed inflammation or edema cannot be excluded. Aortic Atherosclerosis (ICD10-I70.0). Electronically Signed   By: Lupita Raider M.D.   On: 06/26/2020 10:53    Assessment/Plan Possible New onset seizure without head trauma (HCC) Has not had any more episode Plan to start Keppra if any new activity noticed  Renovascular hypertension with Tachycardia Patient doe shave fluctuating BP Was taken off Diovan and metoprolol in the hospital Metoprolol restarted Will consider adding another Med if BP continues to stay up Mixed hyperlipidemia On Crestor Mixed dementia (HCC) MRI in the past is negative On Namenda Having behavior issues including trying to leave the facility Very anxious Plan to discharge back to her AL room  No Change in meds right now Stage 3b chronic kidney disease (HCC) Creat stable Dysphagia, unspecified type Working with Speech. On Protonix Restless leg On requip Osteoporosis  Family/ staff Communication:   Labs/tests ordered:

## 2020-07-08 ENCOUNTER — Encounter: Payer: Self-pay | Admitting: Nurse Practitioner

## 2020-07-08 ENCOUNTER — Non-Acute Institutional Stay (SKILLED_NURSING_FACILITY): Payer: Medicare Other | Admitting: Nurse Practitioner

## 2020-07-08 DIAGNOSIS — I779 Disorder of arteries and arterioles, unspecified: Secondary | ICD-10-CM

## 2020-07-08 DIAGNOSIS — N1832 Chronic kidney disease, stage 3b: Secondary | ICD-10-CM | POA: Diagnosis not present

## 2020-07-08 DIAGNOSIS — K5901 Slow transit constipation: Secondary | ICD-10-CM | POA: Diagnosis not present

## 2020-07-08 DIAGNOSIS — M545 Low back pain, unspecified: Secondary | ICD-10-CM | POA: Diagnosis not present

## 2020-07-08 DIAGNOSIS — J8410 Pulmonary fibrosis, unspecified: Secondary | ICD-10-CM

## 2020-07-08 DIAGNOSIS — I2510 Atherosclerotic heart disease of native coronary artery without angina pectoris: Secondary | ICD-10-CM | POA: Diagnosis not present

## 2020-07-08 DIAGNOSIS — F0280 Dementia in other diseases classified elsewhere without behavioral disturbance: Secondary | ICD-10-CM

## 2020-07-08 DIAGNOSIS — F0150 Vascular dementia without behavioral disturbance: Secondary | ICD-10-CM

## 2020-07-08 DIAGNOSIS — E782 Mixed hyperlipidemia: Secondary | ICD-10-CM

## 2020-07-08 DIAGNOSIS — G2581 Restless legs syndrome: Secondary | ICD-10-CM

## 2020-07-08 DIAGNOSIS — K219 Gastro-esophageal reflux disease without esophagitis: Secondary | ICD-10-CM | POA: Diagnosis not present

## 2020-07-08 DIAGNOSIS — G309 Alzheimer's disease, unspecified: Secondary | ICD-10-CM

## 2020-07-08 DIAGNOSIS — I150 Renovascular hypertension: Secondary | ICD-10-CM | POA: Diagnosis not present

## 2020-07-08 DIAGNOSIS — R1310 Dysphagia, unspecified: Secondary | ICD-10-CM

## 2020-07-08 DIAGNOSIS — R569 Unspecified convulsions: Secondary | ICD-10-CM

## 2020-07-08 DIAGNOSIS — R000 Tachycardia, unspecified: Secondary | ICD-10-CM

## 2020-07-08 NOTE — Assessment & Plan Note (Signed)
RLE, takes Requip

## 2020-07-08 NOTE — Assessment & Plan Note (Signed)
Constipation, takes Senokot S, prn Bisacodyl suppository  

## 2020-07-08 NOTE — Assessment & Plan Note (Signed)
Seizure: onset 06/26/20, negative CT, EEG, MRI, may consider Keppra if recurs, f/u Neurology in a few weeks

## 2020-07-08 NOTE — Assessment & Plan Note (Signed)
CAD, PVD, takes ASA, Rosuvastatin 

## 2020-07-08 NOTE — Assessment & Plan Note (Signed)
GERD, takes Pantoprazole, Famotidine 

## 2020-07-08 NOTE — Assessment & Plan Note (Signed)
Carotid stenosis US carotid artery: <40% internal carotid artery stenosis bilaterally, s/p endarterectomy.   

## 2020-07-08 NOTE — Assessment & Plan Note (Signed)
elevated Bp, 176/72 07/06/20,  195/79 07/07/20, 174/80 07/08/20. The patient denied generalized malaise, dizziness, change of vision, chest pain/pressure, palpitation, nausea, vomiting, or dysuria. She is afebrile.  Renovascular HTN, taking Metoprolol, resume Losartan 17m qd, update CBC/diff, CMP/eGFR

## 2020-07-08 NOTE — Assessment & Plan Note (Signed)
Hyperlipidemia, takes Rosuvastatin, LDL 70 12/25/19 

## 2020-07-08 NOTE — Assessment & Plan Note (Signed)
OA, general, takes Tylenol 650mg qhs. 

## 2020-07-08 NOTE — Assessment & Plan Note (Signed)
Postinflammatory pulmonary fibrosis, stable.   

## 2020-07-08 NOTE — Progress Notes (Signed)
Location:   SNF [place] Room Number: 28 Place of Service:  SNF (31) Provider: [facility] Jaielle Dlouhy NP  Virgie Dad, MD  Patient Care Team: Virgie Dad, MD as PCP - General (Internal Medicine) Terance Ice, MD (Inactive) (Cardiology)  Extended Emergency Contact Information Primary Emergency Contact: Ephriam Jenkins [place] of [place] Phone: [phone] Relation: Friend Secondary Emergency Contact: Rondall Allegra [place] of [place] Phone: [phone] Relation: Friend  Code Status:  DNR Goals of care: Advanced Directive information Advanced Directives 06/28/2020  Does Patient Have a Medical Advance Directive? -  Type of Paramedic of [place];Living will  Does patient want to make changes to medical advance directive? No - Patient declined  Copy of [place] in Chart? Yes - validated most recent copy scanned in chart (See row information)  Would patient like information on creating a medical advance directive? -  Pre-existing out of facility DNR order (yellow form or pink MOST form) -     Chief Complaint  Patient presents with  . Acute Visit    Blood pressure    HPI:  Pt is a 86 y.o. female seen today for an acute visit for elevated Bp, 176/72 07/06/20,  195/79 07/07/20, 174/80 07/08/20. The patient denied generalized malaise, dizziness, change of vision, chest pain/pressure, palpitation, nausea, vomiting, or dysuria. She is afebrile.    Renovascular HTN, taking Metoprolol             Carotid stenosis US carotid artery: <40% internal carotid artery stenosis bilaterally, s/p endarterectomy.              CAD, PVD, takes ASA, Rosuvastatin             Hyperlipidemia, takes Rosuvastatin, LDL 70 12/25/19             Postinflammatory pulmonary fibrosis, stable.              CKD Bun/creat 13/0.85 06/27/20             Dementia, takes Memantine. No behaviors.              Dysphagia, regular diet.              Constipation, takes Senokot S, prn Bisacodyl suppository             GERD, takes Pantoprazole,  Famotidine             OA, general, takes Tylenol 628m qhs.              RLE, takes Requip   Seizure: onset 06/26/20, negative CT, EEG, MRI, may consider Keppra if recurs, f/u Neurology in a few weeks.   Past Medical History:  Diagnosis Date  . Anemia   . Carotid arterial disease (HCC)    asymptomatic   . Chest tightness   . Coronary artery disease   . High blood pressure   . Leg pain    with walking  . Palpitations    Past Surgical History:  Procedure Laterality Date  . CARDIAC CATHETERIZATION  07/15/04   noncritical CAD,80% PLA treated medically. EF% 60. no RAS.  .Marland KitchenCAROTID ENDARTERECTOMY     RIGHT=05/11/11, LEFT=03/30/11  . EYE SURGERY  2011   Cataract surgery Bilateral  . EYE SURGERY  2011   Bilateral eyelid surgery for ptosis  . renal artery duplex  02/23/13   ABDOMINAL AORTA:<50% DIAMETER REDUCTION. RIGHT RENAL ARTERY: 60-99% DIAMETER REDUCTION..Marland KitchenLEFT RENAL ARTERY:1-59% DIAMETER REDUCTION.  Allergies  Allergen Reactions  . Cardura [Doxazosin Mesylate] Other (See Comments)    Reaction not recalled by the patient  . Lotensin [Benazepril Hcl] Other (See Comments)    Reaction not recalled by the patient  . Tramadol Nausea And Vomiting    Allergies as of 07/08/2020      Reactions   Cardura [doxazosin Mesylate] Other (See Comments)   Reaction not recalled by the patient   Lotensin [benazepril Hcl] Other (See Comments)   Reaction not recalled by the patient   Tramadol Nausea And Vomiting      Medication List       Accurate as of July 08, 2020 11:59 PM. If you have any questions, ask your nurse or doctor.        acetaminophen 325 MG tablet Commonly known as: TYLENOL Take 650 mg by mouth at bedtime.   aspirin EC 81 MG tablet Take 1 tablet (81 mg total) by mouth daily.   bisacodyl 10 MG suppository Commonly known as: DULCOLAX Place 10 mg rectally daily as  needed for moderate constipation.   famotidine 20 MG tablet Commonly known as: PEPCID TAKE 1 TABLET BY MOUTH EVERYDAY AT BEDTIME What changed: See the new instructions.   memantine 5 MG tablet Commonly known as: NAMENDA Take 5 mg by mouth 2 (two) times daily.   metoprolol tartrate 25 MG tablet Commonly known as: LOPRESSOR Take 25 mg by mouth 2 (two) times daily.   pantoprazole 40 MG tablet Commonly known as: PROTONIX TAKE 1 TABLET (40 MG TOTAL) BY MOUTH DAILY. TAKE 30-60 MIN BEFORE FIRST MEAL OF THE DAY What changed: additional instructions   rOPINIRole 1 MG tablet Commonly known as: REQUIP Take 1 mg by mouth every evening.   rosuvastatin 10 MG tablet Commonly known as: CRESTOR Take 10 mg by mouth at bedtime.   senna-docusate 8.6-50 MG tablet Commonly known as: Senokot-S Take 2 tablets by mouth at bedtime.   Vitamin D3 25 MCG tablet Commonly known as: Vitamin D Take 1,000 Units by mouth daily.   zinc oxide 20 % ointment Apply 1 application topically as needed for irritation.       Review of Systems  Constitutional: Negative for activity change, appetite change and fever.  HENT: Positive for hearing loss. Negative for congestion and trouble swallowing.   Eyes: Negative for visual disturbance.  Respiratory: Negative for cough, shortness of breath and wheezing.   Cardiovascular: Negative for chest pain, palpitations and leg swelling.  Gastrointestinal: Negative for abdominal pain and constipation.  Genitourinary: Negative for dysuria, frequency and urgency.  Musculoskeletal: Positive for back pain and gait problem.       Uses walker.   Skin: Negative for color change.  Neurological: Negative for seizures, syncope, speech difficulty, weakness, light-headedness and headaches.  Psychiatric/Behavioral: Negative for behavioral problems and sleep disturbance. The patient is not nervous/anxious.     Immunization History  Administered Date(s) Administered  . Influenza  Split 04/19/2014, 04/20/2015  . Influenza Whole 04/19/2016  . Influenza,inj,Quad PF,6+ Mos 04/14/2018  . Influenza-Unspecified 04/23/2020  . Moderna Sars-Covid-2 Vaccination 07/24/2019, 08/21/2019, 06/03/2020  . Pneumococcal Conjugate-13 08/23/2014  . Tdap 09/21/2017   Pertinent  Health Maintenance Due  Topic Date Due  . PNA vac Low Risk Adult (2 of 2 - PPSV23) 08/24/2015  . INFLUENZA VACCINE  Completed  . DEXA SCAN  Completed   Fall Risk  03/24/2016  Falls in the past year? No  Comment Emmi Telephone Survey: data to providers prior to load  Functional Status Survey:    Vitals:   07/08/20 1130  BP: (!) 160/80  Pulse: 76  Resp: 20  Temp: (!) 97 F (36.1 C)  SpO2: 97%  Weight: 98 lb 9.6 oz (44.7 kg)  Height: 5' 2"  (1.575 m)   Body mass index is 18.03 kg/m. Physical Exam Vitals and nursing note reviewed.  Constitutional:      Appearance: Normal appearance.  HENT:     Head: Normocephalic and atraumatic.     Mouth/Throat:     Mouth: Mucous membranes are moist.  Eyes:     Extraocular Movements: Extraocular movements intact.     Conjunctiva/sclera: Conjunctivae normal.     Pupils: Pupils are equal, round, and reactive to light.  Cardiovascular:     Rate and Rhythm: Normal rate and regular rhythm.     Heart sounds: No murmur heard.   Pulmonary:     Effort: Pulmonary effort is normal.     Breath sounds: Rales present. No wheezing or rhonchi.     Comments: Bibasilar rales.  Abdominal:     General: Bowel sounds are normal.     Palpations: Abdomen is soft.     Tenderness: There is no abdominal tenderness.  Musculoskeletal:     Cervical back: Normal range of motion and neck supple.     Right lower leg: No edema.     Left lower leg: No edema.     Comments: R+L SIJ aches is better controlled on Tylenol   Skin:    General: Skin is warm and dry.  Neurological:     General: No focal deficit present.     Mental Status: She is alert. Mental status is at baseline.      Motor: No weakness.     Coordination: Coordination normal.     Gait: Gait abnormal.     Comments: Oriented to person, place. Furniture, rails walking sometimes.  Psychiatric:        Mood and Affect: Mood normal.        Behavior: Behavior normal.     Labs reviewed: Recent Labs    04/09/20 0000 06/26/20 0830 06/27/20 0413 06/27/20 1729  NA 138 134* 135  --   K 4.1 3.9 3.0*  --   CL 97* 93* 95*  --   CO2 33* 29 25  --   GLUCOSE  --  130* 92 291*  BUN 25* 21 13  --   CREATININE 0.9 0.89 0.85  --   CALCIUM 10.5 10.0 9.5  --    Recent Labs    11/09/19 1441 03/28/20 0000 04/09/20 0000 06/26/20 0830 06/27/20 0413  AST 28   < > 18 23 35  ALT 54*   < > 22 20 20   ALKPHOS 82   < > 70 60 56  BILITOT 0.7  --   --  0.8 1.5*  PROT 6.4*  --   --  6.5 6.1*  ALBUMIN 2.8*   < > 4.1 3.9 3.5   < > = values in this interval not displayed.   Recent Labs    11/09/19 1441 03/28/20 0000 04/09/20 0000 06/26/20 0830 06/27/20 0413  WBC 10.0   < > 7.2 12.6* 10.7*  NEUTROABS 7.7  --   --  11.2*  --   HGB 11.4*   < > 14.3 14.1 14.2  HCT 36.3  --  43 42.4 40.3  MCV 91.9  --   --  94.2 90.6  PLT 299  --  198  193 155   < > = values in this interval not displayed.   Lab Results  Component Value Date   TSH 2.130 11/02/2019   Lab Results  Component Value Date   HGBA1C 6.3 (H) 11/02/2019   No results found for: CHOL, HDL, LDLCALC, LDLDIRECT, TRIG, CHOLHDL  Significant Diagnostic Results in last 30 days:  EEG  Result Date: 06/26/2020 Lora Havens, MD     06/26/2020 12:29 PM Patient Name: CORLEY KOHLS MRN: [id number] Epilepsy Attending: Lora Havens Referring Physician/Provider: Dr. Karmen Bongo Date: 06/26/1020 Duration: 25.23 mins Patient history: 86 year old female with new onset seizure-like episode.  EEG to evaluate for seizures. Level of alertness: Awake AEDs during EEG study: Versed Technical aspects: This EEG study was done with scalp electrodes positioned according  to the 10-20 International system of electrode placement. Electrical activity was acquired at a sampling rate of 500Hz  and reviewed with a high frequency filter of 70Hz  and a low frequency filter of 1Hz . EEG data were recorded continuously and digitally stored. Description: No posterior dominant rhythm was seen. EEG showed continuous generalized 3 to 6 Hz theta-delta slowing. Hyperventilation and photic stimulation were not performed.   Of note, EEG was technically difficult due to significant myogenic electrode artifact. ABNORMALITY -Continuous slow, generalized IMPRESSION: This technically difficult study is suggestive of moderate diffuse encephalopathy, nonspecific etiology but could be secondary to postictal state.  No seizures or epileptiform discharges were seen throughout the recording. Lora Havens   CT HEAD WO CONTRAST  Result Date: 06/26/2020 CLINICAL DATA:  Found unresponsive, seizure activity EXAM: CT HEAD WITHOUT CONTRAST TECHNIQUE: Contiguous axial images were obtained from the base of the skull through the vertex without intravenous contrast. COMPARISON:  11/09/2019 FINDINGS: Brain: There is no acute intracranial hemorrhage, mass effect, or edema. Gray-white differentiation is preserved. There is no extra-axial fluid collection. Prominence of the ventricles and sulci reflects stable parenchymal volume loss. Patchy hypoattenuation in the supratentorial white matter is nonspecific but probably reflects stable chronic microvascular ischemic changes. Vascular: There is atherosclerotic calcification at the skull base. Skull: Calvarium is unremarkable. Sinuses/Orbits: Polypoid right maxillary sinus mucosal thickening. Other: None. IMPRESSION: No acute intracranial abnormality. Stable chronic/nonemergent findings detailed above. Electronically Signed   By: Macy Mis M.D.   On: 06/26/2020 08:19   CT Cervical Spine Wo Contrast  Result Date: 06/26/2020 CLINICAL DATA:  Found unresponsive,  seizure activity EXAM: CT CERVICAL SPINE WITHOUT CONTRAST TECHNIQUE: Multidetector CT imaging of the cervical spine was performed without intravenous contrast. Multiplanar CT image reconstructions were also generated. COMPARISON:  None. FINDINGS: Alignment: Trace retrolisthesis at C5-C6. Skull base and vertebrae: No acute cervical spine fracture. Vertebral body heights are maintained. Soft tissues and spinal canal: No prevertebral fluid or swelling. No visible canal hematoma. Disc levels: Degenerative changes are present greatest at C5-C6 and C6-C7. Upper chest: Scarring at the lung apices. Other: Calcified plaque at the common carotid bifurcations. IMPRESSION: No acute cervical spine fracture. Electronically Signed   By: Macy Mis M.D.   On: 06/26/2020 08:23   DG Chest Portable 1 View  Result Date: 06/26/2020 CLINICAL DATA:  Altered mental status. EXAM: PORTABLE CHEST 1 VIEW COMPARISON:  November 16, 2016. FINDINGS: The heart size and mediastinal contours are within normal limits. No pneumothorax or pleural effusion is noted. Stable interstitial densities are noted throughout both lungs most consistent with chronic interstitial lung disease or fibrosis, but acute superimposed inflammation or edema cannot be excluded. The visualized skeletal structures are unremarkable. IMPRESSION: Stable  interstitial densities are noted throughout both lungs most consistent with chronic interstitial lung disease or fibrosis, but acute superimposed inflammation or edema cannot be excluded. Aortic Atherosclerosis (ICD10-I70.0). Electronically Signed   By: Marijo Conception M.D.   On: 06/26/2020 10:53    Assessment/Plan Renovascular hypertension elevated Bp, 176/72 07/06/20,  195/79 07/07/20, 174/80 07/08/20. The patient denied generalized malaise, dizziness, change of vision, chest pain/pressure, palpitation, nausea, vomiting, or dysuria. She is afebrile.  Renovascular HTN, taking Metoprolol, resume Losartan 68m qd, update  CBC/diff, CMP/eGFR  Carotid arterial disease (HCC) Carotid stenosis [place]: <40% internal carotid artery stenosis bilaterally, s/p endarterectomy.    CAD (coronary artery disease) CAD, PVD, takes ASA, Rosuvastatin               Hyperlipidemia Hyperlipidemia, takes Rosuvastatin, LDL 70 12/25/19   Postinflammatory pulmonary fibrosis (HCC) Postinflammatory pulmonary fibrosis, stable.    CKD (chronic kidney disease) stage 3, GFR 30-59 ml/min (HCC) CKD Bun/creat 13/0.85 06/27/20  Mixed dementia ([place] Dementia, takes Memantine. No behaviors.   Dysphagia Dysphagia, regular diet.   Slow transit constipation Constipation, takes Senokot S, prn Bisacodyl suppository   GERD (gastroesophageal reflux disease) GERD, takes Pantoprazole,  Famotidine  Lower back pain OA, general, takes Tylenol [id number].    Restless leg syndrome RLE, takes Requip    Tachycardia HR is in control, continue Metoprolol.   New onset seizure without head trauma ([facility]: onset 06/26/20, negative CT, EEG, MRI, may consider Keppra if recurs, f/u Neurology in a few weeks    Family/ staff Communication: plan of care reviewed with the patient, the patient's HPOA son, and charge nurse.   Labs/tests ordered: CBC/diff, CMP/eGFR  Time spend 35 minutes.

## 2020-07-08 NOTE — Assessment & Plan Note (Signed)
HR is in control, continue Metoprolol.

## 2020-07-08 NOTE — Assessment & Plan Note (Signed)
Dysphagia,regular diet. 

## 2020-07-08 NOTE — Assessment & Plan Note (Signed)
Dementia, takes Memantine. No behaviors.   

## 2020-07-08 NOTE — Assessment & Plan Note (Signed)
CKD Bun/creat 13/0.85 06/27/20 

## 2020-07-09 ENCOUNTER — Encounter: Payer: Self-pay | Admitting: Nurse Practitioner

## 2020-07-10 ENCOUNTER — Encounter: Payer: Self-pay | Admitting: Nurse Practitioner

## 2020-07-10 ENCOUNTER — Non-Acute Institutional Stay (SKILLED_NURSING_FACILITY): Payer: Medicare Other | Admitting: Nurse Practitioner

## 2020-07-10 DIAGNOSIS — G2581 Restless legs syndrome: Secondary | ICD-10-CM

## 2020-07-10 DIAGNOSIS — K5901 Slow transit constipation: Secondary | ICD-10-CM

## 2020-07-10 DIAGNOSIS — I6523 Occlusion and stenosis of bilateral carotid arteries: Secondary | ICD-10-CM | POA: Diagnosis not present

## 2020-07-10 DIAGNOSIS — K219 Gastro-esophageal reflux disease without esophagitis: Secondary | ICD-10-CM | POA: Diagnosis not present

## 2020-07-10 DIAGNOSIS — N1832 Chronic kidney disease, stage 3b: Secondary | ICD-10-CM | POA: Diagnosis not present

## 2020-07-10 DIAGNOSIS — M545 Low back pain, unspecified: Secondary | ICD-10-CM

## 2020-07-10 DIAGNOSIS — I2510 Atherosclerotic heart disease of native coronary artery without angina pectoris: Secondary | ICD-10-CM

## 2020-07-10 DIAGNOSIS — I150 Renovascular hypertension: Secondary | ICD-10-CM

## 2020-07-10 DIAGNOSIS — E782 Mixed hyperlipidemia: Secondary | ICD-10-CM

## 2020-07-10 DIAGNOSIS — F0280 Dementia in other diseases classified elsewhere without behavioral disturbance: Secondary | ICD-10-CM

## 2020-07-10 DIAGNOSIS — J8410 Pulmonary fibrosis, unspecified: Secondary | ICD-10-CM

## 2020-07-10 DIAGNOSIS — G309 Alzheimer's disease, unspecified: Secondary | ICD-10-CM

## 2020-07-10 DIAGNOSIS — R569 Unspecified convulsions: Secondary | ICD-10-CM

## 2020-07-10 DIAGNOSIS — R1310 Dysphagia, unspecified: Secondary | ICD-10-CM

## 2020-07-10 DIAGNOSIS — F0150 Vascular dementia without behavioral disturbance: Secondary | ICD-10-CM

## 2020-07-10 NOTE — Assessment & Plan Note (Signed)
Hyperlipidemia, takes Rosuvastatin, LDL 70 12/25/19 

## 2020-07-10 NOTE — Assessment & Plan Note (Signed)
Carotidarterystenosis US carotid artery: <40% internal carotid artery stenosis bilaterally, s/p endarterectomy.  

## 2020-07-10 NOTE — Assessment & Plan Note (Signed)
Seizure: onset 06/26/20, negative CT, EEG, MRI, may consider Keppra if recurs, f/u Neurology in a few weeks 

## 2020-07-10 NOTE — Assessment & Plan Note (Signed)
GERD, takes Pantoprazole, Famotidine 

## 2020-07-10 NOTE — Assessment & Plan Note (Signed)
Renovascular HTN, taking Metoprolol, Losartan, Bun/creat 22/0.80 07/09/20

## 2020-07-10 NOTE — Assessment & Plan Note (Signed)
Postinflammatory pulmonary fibrosis, stable.   

## 2020-07-10 NOTE — Progress Notes (Signed)
Location:   S[facility] Room Number: 28 Place of Service:  SNF (31)  Provider: Chipper Oman NP  PCP: Lindsay Gammon, MD Patient Care Team: Lindsay Gammon, MD as PCP - General (Internal Medicine) Lindsay Griffins, MD (Inactive) (Cardiology)  Extended Emergency Contact Information Primary Emergency Contact: [name] [place] of [place] Home Phone: [phone] Relation: Friend Secondary Emergency Contact: [name] [place] Home Phone: [phone] Relation: Friend  Code Status: DNR Goals of care:  Advanced Directive information Advanced Directives 07/10/2020  Does Patient Have a Medical Advance Directive? Yes  Type of Estate agent of [place];Living will  Does patient want to make changes to medical advance directive? No - Patient declined  Copy of Healthcare Power of Attorney in Chart? Yes - validated most recent copy scanned in chart (See row information)  Would patient like information on creating a medical advance directive? -  Pre-existing out of facility DNR order (yellow form or pink MOST form) -     Allergies  Allergen Reactions  . Cardura [Doxazosin Mesylate] Other (See Comments)    Reaction not recalled by the patient  . Lotensin [Benazepril Hcl] Other (See Comments)    Reaction not recalled by the patient  . Tramadol Nausea And Vomiting    Chief Complaint  Patient presents with  . Discharge Note    [name]    HPI:  86 y.o. female with past medical history of renovascular HTN, carotid artery stenosis, CAD, PVD, hyperlipidemia, post inflammatory pulmonary fibrosis, CKD, dementia, dysphagia, constipation, GERD, OA, RLS, and recent onset seizure 06/26/20 admitted to SNF [facility] for therapy. The patient has regained her physical strength, ADL function, she is ready to return [facility] for continue rehabilitation.    Renovascular HTN, taking Metoprolol, Losartan, Bun/creat 22/0.80  07/09/20 Carotid artery stenosis US carotid artery: <40% internal carotid artery stenosis bilaterally, s/p endarterectomy.  CAD, PVD, takes ASA, Rosuvastatin Hyperlipidemia, takes Rosuvastatin, LDL 70 12/25/19 Postinflammatory pulmonary fibrosis, stable.  CKD Bun/creat 13/0.85 06/27/20 Dementia, takes Memantine. No behaviors.  Dysphagia, regular diet.   Constipation, takes Senokot S, prn Bisacodyl suppository GERD, takes Pantoprazole, Famotidine OA, general, takes Tylenol  qhs.  RLS, takes Requip              Seizure: onset 06/26/20, negative CT, EEG, MRI, may consider Keppra if recurs, f/u Neurology in a few weeks.    Past Medical History:  Diagnosis Date  . Anemia   . Carotid arterial disease (HCC)    asymptomatic   . Chest tightness   . Coronary artery disease   . High blood pressure   . Leg pain    with walking  . Palpitations     Past Surgical History:  Procedure Laterality Date  . CARDIAC CATHETERIZATION  07/15/04   noncritical CAD,80% PLA treated medically. EF% 60. no RAS.  [name] CAROTID ENDARTERECTOMY     RIGHT=05/11/11, LEFT=03/30/11  . EYE SURGERY  2011   Cataract surgery Bilateral  . EYE SURGERY  2011   Bilateral eyelid surgery for ptosis  . renal artery duplex  02/23/13   ABDOMINAL AORTA:<50% DIAMETER REDUCTION. RIGHT RENAL ARTERY: 60-99% DIAMETER REDUCTION.[name] LEFT RENAL ARTERY:1-59% DIAMETER REDUCTION.      reports that she has never smoked. She has never used smokeless tobacco. She reports that she does not drink alcohol and does not use drugs. Social History   Socioeconomic History  . Marital status: Widowed    Spouse name: Not on  file  . Number of children: Not on file  . Years of education: Not on file  . Highest education level: Not on file  Occupational History  . Not on file  Tobacco Use  . Smoking status: Never Smoker   . Smokeless tobacco: Never Used  Substance and Sexual Activity  . Alcohol use: No  . Drug use: No  . Sexual activity: Not on file  Other Topics Concern  . Not on file  Social History Narrative  . Not on file   Social Determinants of Health   Financial Resource Strain: Not on file  Food Insecurity: Not on file  Transportation Needs: Not on file  Physical Activity: Not on file  Stress: Not on file  Social Connections: Not on file  Intimate Partner Violence: Not on file   Functional Status Survey:    Allergies  Allergen Reactions  . Cardura [Doxazosin Mesylate] Other (See Comments)    Reaction not recalled by the patient  . Lotensin [Benazepril Hcl] Other (See Comments)    Reaction not recalled by the patient  . Tramadol Nausea And Vomiting    Pertinent  Health Maintenance Due  Topic Date Due  . PNA vac Low Risk Adult (2 of 2 - PPSV23) 08/24/2015  . INFLUENZA VACCINE  Completed  . DEXA SCAN  Completed    Medications: Allergies as of 07/10/2020      Reactions   Cardura [doxazosin Mesylate] Other (See Comments)   Reaction not recalled by the patient   Lotensin [benazepril Hcl] Other (See Comments)   Reaction not recalled by the patient   Tramadol Nausea And Vomiting      Medication List       Accurate as of July 10, 2020 11:59 PM. If you have any questions, ask your nurse or doctor.        acetaminophen 325 MG tablet Commonly known as: TYLENOL Take 650 mg by mouth at bedtime.   aspirin EC 81 MG tablet Take 1 tablet (81 mg total) by mouth daily.   bisacodyl 10 MG suppository Commonly known as: DULCOLAX Place 10 mg rectally daily as needed for moderate constipation.   famotidine 20 MG tablet Commonly known as: PEPCID TAKE 1 TABLET BY MOUTH EVERYDAY AT BEDTIME What changed: See the new instructions.   losartan 25 MG tablet Commonly known as: COZAAR Take 25 mg by mouth daily.   memantine 5 MG tablet Commonly known as: NAMENDA Take 5 mg by  mouth 2 (two) times daily.   metoprolol tartrate 25 MG tablet Commonly known as: LOPRESSOR Take 25 mg by mouth 2 (two) times daily.   pantoprazole 40 MG tablet Commonly known as: PROTONIX TAKE 1 TABLET (40 MG TOTAL) BY MOUTH DAILY. TAKE 30-60 MIN BEFORE FIRST MEAL OF THE DAY What changed: additional instructions   rOPINIRole 1 MG tablet Commonly known as: REQUIP Take 1 mg by mouth every evening.   rosuvastatin 10 MG tablet Commonly known as: CRESTOR Take 10 mg by mouth at bedtime.   senna-docusate 8.6-50 MG tablet Commonly known as: Senokot-S Take 2 tablets by mouth at bedtime.   Vitamin D3 25 MCG tablet Commonly known as: Vitamin D Take 1,000 Units by mouth daily.   zinc oxide 20 % ointment Apply 1 application topically as needed for irritation.       Review of Systems  Constitutional: Negative for activity change, appetite change and fever.  HENT: Positive for hearing loss. Negative for congestion and trouble swallowing.   Eyes: Negative  for visual disturbance.  Respiratory: Negative for cough, shortness of breath and wheezing.   Cardiovascular: Negative for chest pain, palpitations and leg swelling.  Gastrointestinal: Negative for abdominal pain and constipation.  Genitourinary: Negative for dysuria, frequency and urgency.  Musculoskeletal: Positive for back pain and gait problem.       Uses walker.   Skin: Negative for color change.  Neurological: Negative for seizures, syncope, speech difficulty, weakness, light-headedness and headaches.  Psychiatric/Behavioral: Negative for behavioral problems and sleep disturbance. The patient is not nervous/anxious.     Vitals:   07/10/20 1144  BP: 103/70  Pulse: 68  Resp: 18  Temp: (!) 97.4 F (36.3 C)  SpO2: 97%  Weight: 98 lb 9.6 oz (44.7 kg)  Height: 5\' 2"  (1.575 m)   Body mass index is 18.03 kg/m. Physical Exam Vitals and nursing note reviewed.  Constitutional:      Appearance: Normal appearance.  HENT:      Head: Normocephalic and atraumatic.     Mouth/Throat:     Mouth: Mucous membranes are moist.  Eyes:     Extraocular Movements: Extraocular movements intact.     Conjunctiva/sclera: Conjunctivae normal.     Pupils: Pupils are equal, round, and reactive to light.  Cardiovascular:     Rate and Rhythm: Normal rate and regular rhythm.     Heart sounds: No murmur heard.   Pulmonary:     Effort: Pulmonary effort is normal.     Breath sounds: Rales present. No wheezing or rhonchi.     Comments: Bibasilar rales.  Abdominal:     General: Bowel sounds are normal.     Palpations: Abdomen is soft.     Tenderness: There is no abdominal tenderness.  Musculoskeletal:     Cervical back: Normal range of motion and neck supple.     Right lower leg: No edema.     Left lower leg: No edema.     Comments: R+L SIJ aches is better controlled on Tylenol   Skin:    General: Skin is warm and dry.  Neurological:     General: No focal deficit present.     Mental Status: She is alert. Mental status is at baseline.     Motor: No weakness.     Coordination: Coordination normal.     Gait: Gait abnormal.     Comments: Oriented to person, place. Furniture, rails walking sometimes.  Psychiatric:        Mood and Affect: Mood normal.        Behavior: Behavior normal.     Labs reviewed: Basic Metabolic Panel: Recent Labs    04/09/20 0000 06/26/20 0830 06/27/20 0413 06/27/20 1729  NA 138 134* 135  --   K 4.1 3.9 3.0*  --   CL 97* 93* 95*  --   CO2 33* 29 25  --   GLUCOSE  --  130* 92 291*  BUN 25* 21 13  --   CREATININE 0.9 0.89 0.85  --   CALCIUM 10.5 10.0 9.5  --    Liver Function Tests: Recent Labs    11/09/19 1441 03/28/20 0000 04/09/20 0000 06/26/20 0830 06/27/20 0413  AST 28   < > 18 23 35  ALT 54*   < > 22 20 20   ALKPHOS 82   < > 70 60 56  BILITOT 0.7  --   --  0.8 1.5*  PROT 6.4*  --   --  6.5 6.1*  ALBUMIN 2.8*   < >  4.1 3.9 3.5   < > = values in this interval not displayed.    No results for input(s): LIPASE, AMYLASE in the last 8760 hours. No results for input(s): AMMONIA in the last 8760 hours. CBC: Recent Labs    11/09/19 1441 03/28/20 0000 04/09/20 0000 06/26/20 0830 06/27/20 0413  WBC 10.0   < > 7.2 12.6* 10.7*  NEUTROABS 7.7  --   --  11.2*  --   HGB 11.4*   < > 14.3 14.1 14.2  HCT 36.3  --  43 42.4 40.3  MCV 91.9  --   --  94.2 90.6  PLT 299  --  198 193 155   < > = values in this interval not displayed.   Cardiac Enzymes: No results for input(s): CKTOTAL, CKMB, CKMBINDEX, TROPONINI in the last 8760 hours. BNP: Invalid input(s): POCBNP CBG: Recent Labs    06/27/20 1654 06/28/20 0941 06/28/20 1015  GLUCAP 166* 50* 71    Procedures and Imaging Studies During Stay: EEG  Result Date: 06/26/2020 Charlsie Quest, MD     06/26/2020 12:29 PM Patient Name: BLAYNE GARLICK MRN: [id number] Epilepsy Attending: Charlsie Quest Referring Physician/Provider: Dr. Jonah Blue Date: 06/26/1020 Duration: 25.23 mins Patient history: 86 year old female with new onset seizure-like episode.  EEG to evaluate for seizures. Level of alertness: Awake AEDs during EEG study: Versed Technical aspects: This EEG study was done with scalp electrodes positioned according to the 10-20 International system of electrode placement. Electrical activity was acquired at a sampling rate of 500Hz  and reviewed with a high frequency filter of 70Hz  and a low frequency filter of 1Hz . EEG data were recorded continuously and digitally stored. Description: No posterior dominant rhythm was seen. EEG showed continuous generalized 3 to 6 Hz theta-delta slowing. Hyperventilation and photic stimulation were not performed.   Of note, EEG was technically difficult due to significant myogenic electrode artifact. ABNORMALITY -Continuous slow, generalized IMPRESSION: This technically difficult study is suggestive of moderate diffuse encephalopathy, nonspecific etiology but could be secondary to  postictal state.  No seizures or epileptiform discharges were seen throughout the recording.   CT HEAD WO CONTRAST  Result Date: 06/26/2020 CLINICAL DATA:  Found unresponsive, seizure activity EXAM: CT HEAD WITHOUT CONTRAST TECHNIQUE: Contiguous axial images were obtained from the base of the skull through the vertex without intravenous contrast. COMPARISON:  11/09/2019 FINDINGS: Brain: There is no acute intracranial hemorrhage, mass effect, or edema. Gray-white differentiation is preserved. There is no extra-axial fluid collection. Prominence of the ventricles and sulci reflects stable parenchymal volume loss. Patchy hypoattenuation in the supratentorial white matter is nonspecific but probably reflects stable chronic microvascular ischemic changes. Vascular: There is atherosclerotic calcification at the skull base. Skull: Calvarium is unremarkable. Sinuses/Orbits: Polypoid right maxillary sinus mucosal thickening. Other: None. IMPRESSION: No acute intracranial abnormality. Stable chronic/nonemergent findings detailed above. Electronically Signed   By: Charlsie Quest M.D.   On: 06/26/2020 08:19   CT Cervical Spine Wo Contrast  Result Date: 06/26/2020 CLINICAL DATA:  Found unresponsive, seizure activity EXAM: CT CERVICAL SPINE WITHOUT CONTRAST TECHNIQUE: Multidetector CT imaging of the cervical spine was performed without intravenous contrast. Multiplanar CT image reconstructions were also generated. COMPARISON:  None. FINDINGS: Alignment: Trace retrolisthesis at C5-C6. Skull base and vertebrae: No acute cervical spine fracture. Vertebral body heights are maintained. Soft tissues and spinal canal: No prevertebral fluid or swelling. No visible canal hematoma. Disc levels: Degenerative changes are present greatest at C5-C6 and C6-C7. Upper chest:  Scarring at the lung apices. Other: Calcified plaque at the common carotid bifurcations. IMPRESSION: No acute cervical spine fracture. Electronically  Signed   By: Guadlupe Spanish M.D.   On: 06/26/2020 08:23   DG Chest Portable 1 View  Result Date: 06/26/2020 CLINICAL DATA:  Altered mental status. EXAM: PORTABLE CHEST 1 VIEW COMPARISON:  November 16, 2016. FINDINGS: The heart size and mediastinal contours are within normal limits. No pneumothorax or pleural effusion is noted. Stable interstitial densities are noted throughout both lungs most consistent with chronic interstitial lung disease or fibrosis, but acute superimposed inflammation or edema cannot be excluded. The visualized skeletal structures are unremarkable. IMPRESSION: Stable interstitial densities are noted throughout both lungs most consistent with chronic interstitial lung disease or fibrosis, but acute superimposed inflammation or edema cannot be excluded. Aortic Atherosclerosis (ICD10-I70.0). Electronically Signed   By: Lupita Raider M.D.   On: 06/26/2020 10:53    Assessment/Plan:   Renovascular hypertension Renovascular HTN, taking Metoprolol, Losartan, Bun/creat 22/0.80 07/09/20   Carotid stenosis Carotid artery stenosis US carotid artery: <40% internal carotid artery stenosis bilaterally, s/p endarterectomy.    Coronary artery disease CAD, PVD, takes ASA, Rosuvastatin   Hyperlipidemia Hyperlipidemia, takes Rosuvastatin, LDL 70 12/25/19  Postinflammatory pulmonary fibrosis (HCC) Postinflammatory pulmonary fibrosis, stable.    CKD (chronic kidney disease) stage 3, GFR 30-59 ml/min (HCC) CKD Bun/creat 13/0.85 06/27/20   Mixed dementia (HCC) Dementia, takes Memantine. No behaviors.    Dysphagia Dysphagia, regular diet.   Slow transit constipation Constipation, takes Senokot S, prn Bisacodyl suppository   GERD (gastroesophageal reflux disease) GERD, takes Pantoprazole, Famotidine  Lower back pain OA, general, takes Tylenol 650mg  qhs.    Restless leg syndrome RLS, takes Requip   New onset seizure without head trauma [facility]) Seizure:  onset 06/26/20, negative CT, EEG, MRI, may consider Keppra if recurs, f/u Neurology in a few weeks.     Patient is being discharged with the following home health services:    Patient is being discharged with the following durable medical equipment:    Patient has been advised to f/u with their PCP in 1-2 weeks to bring them up to date on their rehab stay.  Social services at facility was responsible for arranging this appointment.  Pt was provided with a 30 day supply of prescriptions for medications and refills must be obtained from their PCP.  For controlled substances, a more limited supply may be provided adequate until PCP appointment only.  Future labs/tests needed:  none

## 2020-07-10 NOTE — Assessment & Plan Note (Signed)
Constipation, takes Senokot S, prn Bisacodyl suppository  

## 2020-07-10 NOTE — Assessment & Plan Note (Signed)
CAD, PVD, takes ASA, Rosuvastatin 

## 2020-07-10 NOTE — Assessment & Plan Note (Signed)
RLS, takes Requip  

## 2020-07-10 NOTE — Assessment & Plan Note (Signed)
Dysphagia,regular diet. 

## 2020-07-10 NOTE — Assessment & Plan Note (Signed)
CKD Bun/creat 13/0.85 06/27/20 

## 2020-07-10 NOTE — Assessment & Plan Note (Signed)
OA, general, takes Tylenol 650mg qhs. 

## 2020-07-10 NOTE — Assessment & Plan Note (Signed)
Dementia, takes Memantine. No behaviors.

## 2020-07-11 ENCOUNTER — Encounter: Payer: Self-pay | Admitting: Nurse Practitioner

## 2020-07-19 DIAGNOSIS — A0472 Enterocolitis due to Clostridium difficile, not specified as recurrent: Secondary | ICD-10-CM | POA: Diagnosis not present

## 2020-07-22 DIAGNOSIS — G309 Alzheimer's disease, unspecified: Secondary | ICD-10-CM | POA: Diagnosis not present

## 2020-07-22 DIAGNOSIS — R2689 Other abnormalities of gait and mobility: Secondary | ICD-10-CM | POA: Diagnosis not present

## 2020-07-22 DIAGNOSIS — R2681 Unsteadiness on feet: Secondary | ICD-10-CM | POA: Diagnosis not present

## 2020-07-22 DIAGNOSIS — R4789 Other speech disturbances: Secondary | ICD-10-CM | POA: Diagnosis not present

## 2020-07-22 DIAGNOSIS — Z9181 History of falling: Secondary | ICD-10-CM | POA: Diagnosis not present

## 2020-07-22 DIAGNOSIS — M6281 Muscle weakness (generalized): Secondary | ICD-10-CM | POA: Diagnosis not present

## 2020-07-23 DIAGNOSIS — R2689 Other abnormalities of gait and mobility: Secondary | ICD-10-CM | POA: Diagnosis not present

## 2020-07-23 DIAGNOSIS — R4789 Other speech disturbances: Secondary | ICD-10-CM | POA: Diagnosis not present

## 2020-07-23 DIAGNOSIS — R2681 Unsteadiness on feet: Secondary | ICD-10-CM | POA: Diagnosis not present

## 2020-07-23 DIAGNOSIS — M6281 Muscle weakness (generalized): Secondary | ICD-10-CM | POA: Diagnosis not present

## 2020-07-23 DIAGNOSIS — Z9181 History of falling: Secondary | ICD-10-CM | POA: Diagnosis not present

## 2020-07-23 DIAGNOSIS — G309 Alzheimer's disease, unspecified: Secondary | ICD-10-CM | POA: Diagnosis not present

## 2020-07-24 ENCOUNTER — Encounter: Payer: Self-pay | Admitting: Internal Medicine

## 2020-07-24 ENCOUNTER — Non-Acute Institutional Stay: Payer: Medicare Other | Admitting: Internal Medicine

## 2020-07-24 DIAGNOSIS — R569 Unspecified convulsions: Secondary | ICD-10-CM

## 2020-07-24 DIAGNOSIS — I150 Renovascular hypertension: Secondary | ICD-10-CM

## 2020-07-24 DIAGNOSIS — E782 Mixed hyperlipidemia: Secondary | ICD-10-CM

## 2020-07-24 DIAGNOSIS — N1832 Chronic kidney disease, stage 3b: Secondary | ICD-10-CM | POA: Diagnosis not present

## 2020-07-24 DIAGNOSIS — I2510 Atherosclerotic heart disease of native coronary artery without angina pectoris: Secondary | ICD-10-CM | POA: Diagnosis not present

## 2020-07-24 NOTE — Progress Notes (Signed)
Location:    [facility] Room Number: 15 Place of Service:  ALF [phone]) Provider:  Einar Crow MD  Mahlon Gammon, MD  Patient Care Team: Mahlon Gammon, MD as PCP - General (Internal Medicine) Susa Griffins, MD (Inactive) (Cardiology)  Extended Emergency Contact Information Primary Emergency Contact: Odelia Gage of [place] Home Phone: [phone] Relation: Friend Secondary Emergency Contact: Florian Buff [place] of [place] Home Phone: [phone] Relation: Friend  Code Status:  DNR Goals of care: Advanced Directive information Advanced Directives 07/10/2020  Does Patient Have a Medical Advance Directive? Yes  Type of Estate agent of [place];Living will  Does patient want to make changes to medical advance directive? No - Patient declined  Copy of Healthcare Power of Attorney in Chart? Yes - validated most recent copy scanned in chart (See row information)  Would patient like information on creating a medical advance directive? -  Pre-existing out of facility DNR order (yellow form or pink MOST form) -     Chief Complaint  Patient presents with  . Acute Visit    Hypertension    HPI:  Pt is a 87 y.o. female seen today for an acute visit for Hypertension  [place]  Admitted in hospital from 12/08 - 12/10 for Possible seizure.  Patient has h/o Hypertension, Hyperlipidemia, CAD, MVP, Restless Leg, Hiatal Hernia, Osteoporosisand Arthritis Her BP meds were changed in the hospital  But since she has been back her BP has been running high. SBP sometimes more then 160-170 Patient continues to be asymptomatic She was started back on Metoprolol and Cozaar BP still running high No Other issues Doing well now that she is back in her Room in [place]    Past Medical History:  Diagnosis Date  . Anemia   . Carotid arterial disease (HCC)    asymptomatic   . Chest tightness   . Coronary  artery disease   . High blood pressure   . Leg pain    with walking  . Palpitations    Past Surgical History:  Procedure Laterality Date  . CARDIAC CATHETERIZATION  07/15/04   noncritical CAD,80% PLA treated medically. EF% 60. no RAS.  [name] CAROTID ENDARTERECTOMY     RIGHT=05/11/11, LEFT=03/30/11  . EYE SURGERY  2011   Cataract surgery Bilateral  . EYE SURGERY  2011   Bilateral eyelid surgery for ptosis  . renal artery duplex  02/23/13   ABDOMINAL AORTA:<50% DIAMETER REDUCTION. RIGHT RENAL ARTERY: 60-99% DIAMETER REDUCTION.[name] LEFT RENAL ARTERY:1-59% DIAMETER REDUCTION.    Allergies  Allergen Reactions  . Cardura [Doxazosin Mesylate] Other (See Comments)    Reaction not recalled by the patient  . Lotensin [Benazepril Hcl] Other (See Comments)    Reaction not recalled by the patient  . Tramadol Nausea And Vomiting    Allergies as of 07/24/2020      Reactions   Cardura [doxazosin Mesylate] Other (See Comments)   Reaction not recalled by the patient   Lotensin [benazepril Hcl] Other (See Comments)   Reaction not recalled by the patient   Tramadol Nausea And Vomiting      Medication List       Accurate as of July 24, 2020 11:22 AM. If you have any questions, ask your nurse or doctor.        STOP taking these medications   senna-docusate 8.6-50 MG tablet Commonly known as: Senokot-S Stopped by: Mahlon Gammon, MD     TAKE  these medications   acetaminophen 325 MG tablet Commonly known as: TYLENOL Take 650 mg by mouth at bedtime.   aspirin EC 81 MG tablet Take 1 tablet (81 mg total) by mouth daily.   bisacodyl 10 MG suppository Commonly known as: DULCOLAX Place 10 mg rectally daily as needed for moderate constipation.   famotidine 20 MG tablet Commonly known as: PEPCID TAKE 1 TABLET BY MOUTH EVERYDAY AT BEDTIME What changed: See the new instructions.   losartan 25 MG tablet Commonly known as: COZAAR Take 25 mg by mouth daily.   memantine 5 MG tablet Commonly  known as: NAMENDA Take 5 mg by mouth 2 (two) times daily.   metoprolol tartrate 25 MG tablet Commonly known as: LOPRESSOR Take 25 mg by mouth 2 (two) times daily.   pantoprazole 40 MG tablet Commonly known as: PROTONIX TAKE 1 TABLET (40 MG TOTAL) BY MOUTH DAILY. TAKE 30-60 MIN BEFORE FIRST MEAL OF THE DAY What changed: additional instructions   rOPINIRole 1 MG tablet Commonly known as: REQUIP Take 1 mg by mouth every evening.   rosuvastatin 10 MG tablet Commonly known as: CRESTOR Take 10 mg by mouth at bedtime.   Vitamin D3 25 MCG tablet Commonly known as: Vitamin D Take 1,000 Units by mouth daily.   zinc oxide 20 % ointment Apply 1 application topically as needed for irritation.       Review of Systems  Unable to perform ROS: Dementia    Immunization History  Administered Date(s) Administered  . Influenza Split 04/19/2014, 04/20/2015  . Influenza Whole 04/19/2016  . Influenza,inj,Quad PF,6+ Mos 04/14/2018  . Influenza-Unspecified 04/23/2020  . Moderna Sars-Covid-2 Vaccination 07/24/2019, 08/21/2019, 06/03/2020  . Pneumococcal Conjugate-13 08/23/2014  . Tdap 09/21/2017   Pertinent  Health Maintenance Due  Topic Date Due  . PNA vac Low Risk Adult (2 of 2 - PPSV23) 08/24/2015  . INFLUENZA VACCINE  Completed  . DEXA SCAN  Completed   Fall Risk  03/24/2016  Falls in the past year? No  Comment Emmi Telephone Survey: data to providers prior to load   Functional Status Survey:    Vitals:   07/24/20 1117  BP: (!) 166/74  Pulse: 78  Resp: 18  Temp: (!) 97.4 F (36.3 C)  SpO2: 90%  Weight: 96 lb 6.4 oz (43.7 kg)  Height: 5\' 2"  (1.575 m)   Body mass index is 17.63 kg/m. Physical Exam Constitutional: . Well-developed and well-nourished.  HENT:  Head: Normocephalic.  Mouth/Throat: Oropharynx is clear and moist.  Eyes: Pupils are equal, round, and reactive to light.  Neck: Neck supple.  Cardiovascular: Normal rate and normal heart sounds.  No murmur  heard. Pulmonary/Chest: Effort normal and breath sounds normal. No respiratory distress. No wheezes. She has no rales.  Abdominal: Soft. Bowel sounds are normal. No distension. There is no tenderness. There is no rebound.  Musculoskeletal: No edema.  Lymphadenopathy: none Neurological: Oriented to herself Walks with her walker Skin: Skin is warm and dry.  Psychiatric: Normal mood and affect. Behavior is normal. Thought content normal.   Labs reviewed: Recent Labs    04/09/20 0000 06/26/20 0830 06/27/20 0413 06/27/20 1729  NA 138 134* 135  --   K 4.1 3.9 3.0*  --   CL 97* 93* 95*  --   CO2 33* 29 25  --   GLUCOSE  --  130* 92 291*  BUN 25* 21 13  --   CREATININE 0.9 0.89 0.85  --   CALCIUM 10.5 10.0  9.5  --    Recent Labs    11/09/19 1441 03/28/20 0000 04/09/20 0000 06/26/20 0830 06/27/20 0413  AST 28   < > 18 23 35  ALT 54*   < > 22 20 20   ALKPHOS 82   < > 70 60 56  BILITOT 0.7  --   --  0.8 1.5*  PROT 6.4*  --   --  6.5 6.1*  ALBUMIN 2.8*   < > 4.1 3.9 3.5   < > = values in this interval not displayed.   Recent Labs    11/09/19 1441 03/28/20 0000 04/09/20 0000 06/26/20 0830 06/27/20 0413  WBC 10.0   < > 7.2 12.6* 10.7*  NEUTROABS 7.7  --   --  11.2*  --   HGB 11.4*   < > 14.3 14.1 14.2  HCT 36.3  --  43 42.4 40.3  MCV 91.9  --   --  94.2 90.6  PLT 299  --  198 193 155   < > = values in this interval not displayed.   Lab Results  Component Value Date   TSH 2.130 11/02/2019   Lab Results  Component Value Date   HGBA1C 6.3 (H) 11/02/2019   No results found for: CHOL, HDL, LDLCALC, LDLDIRECT, TRIG, CHOLHDL  Significant Diagnostic Results in last 30 days:  EEG  Result Date: 06/26/2020 14/02/2020, MD     06/26/2020 12:29 PM Patient Name: ELIF YONTS MRN: [facility] Epile[facility] Attending: [id number] Referring Physician/Provider: Dr. Charlsie Quest Date: 06/26/1020 Duration: 25.23 mins Patient history: 87 year old female with new onset  seizure-like episode.  EEG to evaluate for seizures. Level of alertness: Awake AEDs during EEG study: Versed Technical aspects: This EEG study was done with scalp electrodes positioned according to the 10-20 International system of electrode placement. Electrical activity was acquired at a sampling rate of 500Hz  and reviewed with a high frequency filter of 70Hz  and a low frequency filter of 1Hz . EEG data were recorded continuously and digitally stored. Description: No posterior dominant rhythm was seen. EEG showed continuous generalized 3 to 6 Hz theta-delta slowing. Hyperventilation and photic stimulation were not performed.   Of note, EEG was technically difficult due to significant myogenic electrode artifact. ABNORMALITY -Continuous slow, generalized IMPRESSION: This technically difficult study is suggestive of moderate diffuse encephalopathy, nonspecific etiology but could be secondary to postictal state.  No seizures or epileptiform discharges were seen throughout the recording. 82   CT HEAD WO CONTRAST  Result Date: 06/26/2020 CLINICAL DATA:  Found unresponsive, seizure activity EXAM: CT HEAD WITHOUT CONTRAST TECHNIQUE: Contiguous axial images were obtained from the base of the skull through the vertex without intravenous contrast. COMPARISON:  11/09/2019 FINDINGS: Brain: There is no acute intracranial hemorrhage, mass effect, or edema. Gray-white differentiation is preserved. There is no extra-axial fluid collection. Prominence of the ventricles and sulci reflects stable parenchymal volume loss. Patchy hypoattenuation in the supratentorial white matter is nonspecific but probably reflects stable chronic microvascular ischemic changes. Vascular: There is atherosclerotic calcification at the skull base. Skull: Calvarium is unremarkable. Sinuses/Orbits: Polypoid right maxillary sinus mucosal thickening. Other: None. IMPRESSION: No acute intracranial abnormality. Stable chronic/nonemergent  findings detailed above. Electronically Signed   By: M.D.   On: 06/26/2020 08:19   CT Cervical Spine Wo Contrast  Result Date: 06/26/2020 CLINICAL DATA:  Found unresponsive, seizure activity EXAM: CT CERVICAL SPINE WITHOUT CONTRAST TECHNIQUE: Multidetector CT imaging of the cervical spine was performed without  intravenous contrast. Multiplanar CT image reconstructions were also generated. COMPARISON:  None. FINDINGS: Alignment: Trace retrolisthesis at C5-C6. Skull base and vertebrae: No acute cervical spine fracture. Vertebral body heights are maintained. Soft tissues and spinal canal: No prevertebral fluid or swelling. No visible canal hematoma. Disc levels: Degenerative changes are present greatest at C5-C6 and C6-C7. Upper chest: Scarring at the lung apices. Other: Calcified plaque at the common carotid bifurcations. IMPRESSION: No acute cervical spine fracture. Electronically Signed   By: Macy Mis M.D.   On: 06/26/2020 08:23   DG Chest Portable 1 View  Result Date: 06/26/2020 CLINICAL DATA:  Altered mental status. EXAM: PORTABLE CHEST 1 VIEW COMPARISON:  November 16, 2016. FINDINGS: The heart size and mediastinal contours are within normal limits. No pneumothorax or pleural effusion is noted. Stable interstitial densities are noted throughout both lungs most consistent with chronic interstitial lung disease or fibrosis, but acute superimposed inflammation or edema cannot be excluded. The visualized skeletal structures are unremarkable. IMPRESSION: Stable interstitial densities are noted throughout both lungs most consistent with chronic interstitial lung disease or fibrosis, but acute superimposed inflammation or edema cannot be excluded. Aortic Atherosclerosis (ICD10-I70.0). Electronically Signed   By: Marijo Conception M.D.   On: 06/26/2020 10:53    Assessment/Plan Renovascular hypertension Will incrase her Cozaar to 25 mg BID Still trying for loose control of BP Check BP twice a  day Also on Metoprolol   New onset seizure without head trauma ([place]) No Treatment yet by Neurology  If one more episode will consdier Keppra  Mixed hyperlipidemia On Crestor Stage 3b chronic kidney disease ([place]) Creat stable Mixed dementia ([place]) MRI in the past is negative On Namenda Restless leg On requip Osteoporosis  Family/ staff Communication:   Labs/tests ordered:  CBC and CMP in 2 weeks

## 2020-07-25 DIAGNOSIS — R2689 Other abnormalities of gait and mobility: Secondary | ICD-10-CM | POA: Diagnosis not present

## 2020-07-25 DIAGNOSIS — Z9181 History of falling: Secondary | ICD-10-CM | POA: Diagnosis not present

## 2020-07-25 DIAGNOSIS — R4789 Other speech disturbances: Secondary | ICD-10-CM | POA: Diagnosis not present

## 2020-07-25 DIAGNOSIS — G309 Alzheimer's disease, unspecified: Secondary | ICD-10-CM | POA: Diagnosis not present

## 2020-07-25 DIAGNOSIS — R2681 Unsteadiness on feet: Secondary | ICD-10-CM | POA: Diagnosis not present

## 2020-07-25 DIAGNOSIS — M6281 Muscle weakness (generalized): Secondary | ICD-10-CM | POA: Diagnosis not present

## 2020-07-26 DIAGNOSIS — Z9181 History of falling: Secondary | ICD-10-CM | POA: Diagnosis not present

## 2020-07-26 DIAGNOSIS — R2681 Unsteadiness on feet: Secondary | ICD-10-CM | POA: Diagnosis not present

## 2020-07-26 DIAGNOSIS — R4789 Other speech disturbances: Secondary | ICD-10-CM | POA: Diagnosis not present

## 2020-07-26 DIAGNOSIS — M6281 Muscle weakness (generalized): Secondary | ICD-10-CM | POA: Diagnosis not present

## 2020-07-26 DIAGNOSIS — G309 Alzheimer's disease, unspecified: Secondary | ICD-10-CM | POA: Diagnosis not present

## 2020-07-26 DIAGNOSIS — R2689 Other abnormalities of gait and mobility: Secondary | ICD-10-CM | POA: Diagnosis not present

## 2020-07-29 DIAGNOSIS — R2681 Unsteadiness on feet: Secondary | ICD-10-CM | POA: Diagnosis not present

## 2020-07-29 DIAGNOSIS — G309 Alzheimer's disease, unspecified: Secondary | ICD-10-CM | POA: Diagnosis not present

## 2020-07-29 DIAGNOSIS — Z9181 History of falling: Secondary | ICD-10-CM | POA: Diagnosis not present

## 2020-07-29 DIAGNOSIS — R2689 Other abnormalities of gait and mobility: Secondary | ICD-10-CM | POA: Diagnosis not present

## 2020-07-29 DIAGNOSIS — M6281 Muscle weakness (generalized): Secondary | ICD-10-CM | POA: Diagnosis not present

## 2020-07-29 DIAGNOSIS — R4789 Other speech disturbances: Secondary | ICD-10-CM | POA: Diagnosis not present

## 2020-07-30 DIAGNOSIS — R4789 Other speech disturbances: Secondary | ICD-10-CM | POA: Diagnosis not present

## 2020-07-30 DIAGNOSIS — G309 Alzheimer's disease, unspecified: Secondary | ICD-10-CM | POA: Diagnosis not present

## 2020-07-30 DIAGNOSIS — R2681 Unsteadiness on feet: Secondary | ICD-10-CM | POA: Diagnosis not present

## 2020-07-30 DIAGNOSIS — R2689 Other abnormalities of gait and mobility: Secondary | ICD-10-CM | POA: Diagnosis not present

## 2020-07-30 DIAGNOSIS — Z9181 History of falling: Secondary | ICD-10-CM | POA: Diagnosis not present

## 2020-07-30 DIAGNOSIS — M6281 Muscle weakness (generalized): Secondary | ICD-10-CM | POA: Diagnosis not present

## 2020-07-31 DIAGNOSIS — R2681 Unsteadiness on feet: Secondary | ICD-10-CM | POA: Diagnosis not present

## 2020-07-31 DIAGNOSIS — G309 Alzheimer's disease, unspecified: Secondary | ICD-10-CM | POA: Diagnosis not present

## 2020-07-31 DIAGNOSIS — R2689 Other abnormalities of gait and mobility: Secondary | ICD-10-CM | POA: Diagnosis not present

## 2020-07-31 DIAGNOSIS — M6281 Muscle weakness (generalized): Secondary | ICD-10-CM | POA: Diagnosis not present

## 2020-07-31 DIAGNOSIS — Z9181 History of falling: Secondary | ICD-10-CM | POA: Diagnosis not present

## 2020-07-31 DIAGNOSIS — R4789 Other speech disturbances: Secondary | ICD-10-CM | POA: Diagnosis not present

## 2020-08-01 DIAGNOSIS — G309 Alzheimer's disease, unspecified: Secondary | ICD-10-CM | POA: Diagnosis not present

## 2020-08-01 DIAGNOSIS — R2689 Other abnormalities of gait and mobility: Secondary | ICD-10-CM | POA: Diagnosis not present

## 2020-08-01 DIAGNOSIS — Z9181 History of falling: Secondary | ICD-10-CM | POA: Diagnosis not present

## 2020-08-01 DIAGNOSIS — M6281 Muscle weakness (generalized): Secondary | ICD-10-CM | POA: Diagnosis not present

## 2020-08-01 DIAGNOSIS — R2681 Unsteadiness on feet: Secondary | ICD-10-CM | POA: Diagnosis not present

## 2020-08-01 DIAGNOSIS — R4789 Other speech disturbances: Secondary | ICD-10-CM | POA: Diagnosis not present

## 2020-08-01 DIAGNOSIS — D649 Anemia, unspecified: Secondary | ICD-10-CM | POA: Diagnosis not present

## 2020-08-01 DIAGNOSIS — F0390 Unspecified dementia without behavioral disturbance: Secondary | ICD-10-CM | POA: Diagnosis not present

## 2020-08-01 LAB — BASIC METABOLIC PANEL
BUN: 18 (ref 4–21)
CO2: 28 — AB (ref 13–22)
Chloride: 100 (ref 99–108)
Creatinine: 1 (ref 0.5–1.1)
Glucose: 117
Potassium: 4.4 (ref 3.4–5.3)
Sodium: 139 (ref 137–147)

## 2020-08-01 LAB — COMPREHENSIVE METABOLIC PANEL
Albumin: 4 (ref 3.5–5.0)
Calcium: 10 (ref 8.7–10.7)
Globulin: 2.3

## 2020-08-01 LAB — CBC AND DIFFERENTIAL
HCT: 45 (ref 36–46)
Hemoglobin: 14.6 (ref 12.0–16.0)
Neutrophils Absolute: 6080
Platelets: 204 (ref 150–399)
WBC: 7.6

## 2020-08-01 LAB — HEPATIC FUNCTION PANEL
ALT: 48 — AB (ref 7–35)
AST: 31 (ref 13–35)
Alkaline Phosphatase: 99 (ref 25–125)
Bilirubin, Total: 0.7

## 2020-08-01 LAB — CBC: RBC: 4.67 (ref 3.87–5.11)

## 2020-08-02 DIAGNOSIS — R2689 Other abnormalities of gait and mobility: Secondary | ICD-10-CM | POA: Diagnosis not present

## 2020-08-02 DIAGNOSIS — Z9181 History of falling: Secondary | ICD-10-CM | POA: Diagnosis not present

## 2020-08-02 DIAGNOSIS — G309 Alzheimer's disease, unspecified: Secondary | ICD-10-CM | POA: Diagnosis not present

## 2020-08-02 DIAGNOSIS — R4789 Other speech disturbances: Secondary | ICD-10-CM | POA: Diagnosis not present

## 2020-08-02 DIAGNOSIS — R2681 Unsteadiness on feet: Secondary | ICD-10-CM | POA: Diagnosis not present

## 2020-08-02 DIAGNOSIS — M6281 Muscle weakness (generalized): Secondary | ICD-10-CM | POA: Diagnosis not present

## 2020-08-05 DIAGNOSIS — R4789 Other speech disturbances: Secondary | ICD-10-CM | POA: Diagnosis not present

## 2020-08-05 DIAGNOSIS — M6281 Muscle weakness (generalized): Secondary | ICD-10-CM | POA: Diagnosis not present

## 2020-08-05 DIAGNOSIS — G309 Alzheimer's disease, unspecified: Secondary | ICD-10-CM | POA: Diagnosis not present

## 2020-08-05 DIAGNOSIS — R2689 Other abnormalities of gait and mobility: Secondary | ICD-10-CM | POA: Diagnosis not present

## 2020-08-05 DIAGNOSIS — Z9181 History of falling: Secondary | ICD-10-CM | POA: Diagnosis not present

## 2020-08-05 DIAGNOSIS — R2681 Unsteadiness on feet: Secondary | ICD-10-CM | POA: Diagnosis not present

## 2020-08-06 DIAGNOSIS — Z9181 History of falling: Secondary | ICD-10-CM | POA: Diagnosis not present

## 2020-08-06 DIAGNOSIS — M6281 Muscle weakness (generalized): Secondary | ICD-10-CM | POA: Diagnosis not present

## 2020-08-06 DIAGNOSIS — R2681 Unsteadiness on feet: Secondary | ICD-10-CM | POA: Diagnosis not present

## 2020-08-06 DIAGNOSIS — R2689 Other abnormalities of gait and mobility: Secondary | ICD-10-CM | POA: Diagnosis not present

## 2020-08-06 DIAGNOSIS — R4789 Other speech disturbances: Secondary | ICD-10-CM | POA: Diagnosis not present

## 2020-08-06 DIAGNOSIS — G309 Alzheimer's disease, unspecified: Secondary | ICD-10-CM | POA: Diagnosis not present

## 2020-08-07 DIAGNOSIS — R2689 Other abnormalities of gait and mobility: Secondary | ICD-10-CM | POA: Diagnosis not present

## 2020-08-07 DIAGNOSIS — Z9181 History of falling: Secondary | ICD-10-CM | POA: Diagnosis not present

## 2020-08-07 DIAGNOSIS — R4789 Other speech disturbances: Secondary | ICD-10-CM | POA: Diagnosis not present

## 2020-08-07 DIAGNOSIS — G309 Alzheimer's disease, unspecified: Secondary | ICD-10-CM | POA: Diagnosis not present

## 2020-08-07 DIAGNOSIS — R2681 Unsteadiness on feet: Secondary | ICD-10-CM | POA: Diagnosis not present

## 2020-08-07 DIAGNOSIS — M6281 Muscle weakness (generalized): Secondary | ICD-10-CM | POA: Diagnosis not present

## 2020-08-08 DIAGNOSIS — G309 Alzheimer's disease, unspecified: Secondary | ICD-10-CM | POA: Diagnosis not present

## 2020-08-08 DIAGNOSIS — R2681 Unsteadiness on feet: Secondary | ICD-10-CM | POA: Diagnosis not present

## 2020-08-08 DIAGNOSIS — Z9181 History of falling: Secondary | ICD-10-CM | POA: Diagnosis not present

## 2020-08-08 DIAGNOSIS — M6281 Muscle weakness (generalized): Secondary | ICD-10-CM | POA: Diagnosis not present

## 2020-08-08 DIAGNOSIS — R2689 Other abnormalities of gait and mobility: Secondary | ICD-10-CM | POA: Diagnosis not present

## 2020-08-08 DIAGNOSIS — R4789 Other speech disturbances: Secondary | ICD-10-CM | POA: Diagnosis not present

## 2020-08-09 DIAGNOSIS — R2689 Other abnormalities of gait and mobility: Secondary | ICD-10-CM | POA: Diagnosis not present

## 2020-08-09 DIAGNOSIS — M6281 Muscle weakness (generalized): Secondary | ICD-10-CM | POA: Diagnosis not present

## 2020-08-09 DIAGNOSIS — Z9181 History of falling: Secondary | ICD-10-CM | POA: Diagnosis not present

## 2020-08-09 DIAGNOSIS — G309 Alzheimer's disease, unspecified: Secondary | ICD-10-CM | POA: Diagnosis not present

## 2020-08-09 DIAGNOSIS — R4789 Other speech disturbances: Secondary | ICD-10-CM | POA: Diagnosis not present

## 2020-08-09 DIAGNOSIS — R2681 Unsteadiness on feet: Secondary | ICD-10-CM | POA: Diagnosis not present

## 2020-08-12 DIAGNOSIS — R2689 Other abnormalities of gait and mobility: Secondary | ICD-10-CM | POA: Diagnosis not present

## 2020-08-12 DIAGNOSIS — R4789 Other speech disturbances: Secondary | ICD-10-CM | POA: Diagnosis not present

## 2020-08-12 DIAGNOSIS — M6281 Muscle weakness (generalized): Secondary | ICD-10-CM | POA: Diagnosis not present

## 2020-08-12 DIAGNOSIS — R2681 Unsteadiness on feet: Secondary | ICD-10-CM | POA: Diagnosis not present

## 2020-08-12 DIAGNOSIS — G309 Alzheimer's disease, unspecified: Secondary | ICD-10-CM | POA: Diagnosis not present

## 2020-08-12 DIAGNOSIS — Z9181 History of falling: Secondary | ICD-10-CM | POA: Diagnosis not present

## 2020-08-13 DIAGNOSIS — G309 Alzheimer's disease, unspecified: Secondary | ICD-10-CM | POA: Diagnosis not present

## 2020-08-13 DIAGNOSIS — M6281 Muscle weakness (generalized): Secondary | ICD-10-CM | POA: Diagnosis not present

## 2020-08-13 DIAGNOSIS — R4789 Other speech disturbances: Secondary | ICD-10-CM | POA: Diagnosis not present

## 2020-08-13 DIAGNOSIS — R2681 Unsteadiness on feet: Secondary | ICD-10-CM | POA: Diagnosis not present

## 2020-08-13 DIAGNOSIS — R2689 Other abnormalities of gait and mobility: Secondary | ICD-10-CM | POA: Diagnosis not present

## 2020-08-13 DIAGNOSIS — Z9181 History of falling: Secondary | ICD-10-CM | POA: Diagnosis not present

## 2020-08-14 DIAGNOSIS — R2681 Unsteadiness on feet: Secondary | ICD-10-CM | POA: Diagnosis not present

## 2020-08-14 DIAGNOSIS — Z9181 History of falling: Secondary | ICD-10-CM | POA: Diagnosis not present

## 2020-08-14 DIAGNOSIS — R2689 Other abnormalities of gait and mobility: Secondary | ICD-10-CM | POA: Diagnosis not present

## 2020-08-14 DIAGNOSIS — G309 Alzheimer's disease, unspecified: Secondary | ICD-10-CM | POA: Diagnosis not present

## 2020-08-14 DIAGNOSIS — M6281 Muscle weakness (generalized): Secondary | ICD-10-CM | POA: Diagnosis not present

## 2020-08-14 DIAGNOSIS — R4789 Other speech disturbances: Secondary | ICD-10-CM | POA: Diagnosis not present

## 2020-08-15 DIAGNOSIS — R2681 Unsteadiness on feet: Secondary | ICD-10-CM | POA: Diagnosis not present

## 2020-08-15 DIAGNOSIS — R4789 Other speech disturbances: Secondary | ICD-10-CM | POA: Diagnosis not present

## 2020-08-15 DIAGNOSIS — Z9181 History of falling: Secondary | ICD-10-CM | POA: Diagnosis not present

## 2020-08-15 DIAGNOSIS — R2689 Other abnormalities of gait and mobility: Secondary | ICD-10-CM | POA: Diagnosis not present

## 2020-08-15 DIAGNOSIS — M6281 Muscle weakness (generalized): Secondary | ICD-10-CM | POA: Diagnosis not present

## 2020-08-15 DIAGNOSIS — G309 Alzheimer's disease, unspecified: Secondary | ICD-10-CM | POA: Diagnosis not present

## 2020-08-16 ENCOUNTER — Encounter: Payer: Self-pay | Admitting: Nurse Practitioner

## 2020-08-16 ENCOUNTER — Non-Acute Institutional Stay: Payer: Medicare Other | Admitting: Nurse Practitioner

## 2020-08-16 DIAGNOSIS — J8410 Pulmonary fibrosis, unspecified: Secondary | ICD-10-CM

## 2020-08-16 DIAGNOSIS — R4789 Other speech disturbances: Secondary | ICD-10-CM | POA: Diagnosis not present

## 2020-08-16 DIAGNOSIS — N1832 Chronic kidney disease, stage 3b: Secondary | ICD-10-CM | POA: Diagnosis not present

## 2020-08-16 DIAGNOSIS — K5901 Slow transit constipation: Secondary | ICD-10-CM | POA: Diagnosis not present

## 2020-08-16 DIAGNOSIS — G2581 Restless legs syndrome: Secondary | ICD-10-CM

## 2020-08-16 DIAGNOSIS — F0280 Dementia in other diseases classified elsewhere without behavioral disturbance: Secondary | ICD-10-CM

## 2020-08-16 DIAGNOSIS — I2510 Atherosclerotic heart disease of native coronary artery without angina pectoris: Secondary | ICD-10-CM

## 2020-08-16 DIAGNOSIS — E782 Mixed hyperlipidemia: Secondary | ICD-10-CM | POA: Diagnosis not present

## 2020-08-16 DIAGNOSIS — G309 Alzheimer's disease, unspecified: Secondary | ICD-10-CM

## 2020-08-16 DIAGNOSIS — Z9181 History of falling: Secondary | ICD-10-CM | POA: Diagnosis not present

## 2020-08-16 DIAGNOSIS — R1310 Dysphagia, unspecified: Secondary | ICD-10-CM

## 2020-08-16 DIAGNOSIS — W19XXXA Unspecified fall, initial encounter: Secondary | ICD-10-CM | POA: Diagnosis not present

## 2020-08-16 DIAGNOSIS — I6523 Occlusion and stenosis of bilateral carotid arteries: Secondary | ICD-10-CM

## 2020-08-16 DIAGNOSIS — M545 Low back pain, unspecified: Secondary | ICD-10-CM

## 2020-08-16 DIAGNOSIS — F0150 Vascular dementia without behavioral disturbance: Secondary | ICD-10-CM

## 2020-08-16 DIAGNOSIS — I150 Renovascular hypertension: Secondary | ICD-10-CM

## 2020-08-16 DIAGNOSIS — R2681 Unsteadiness on feet: Secondary | ICD-10-CM | POA: Diagnosis not present

## 2020-08-16 DIAGNOSIS — K219 Gastro-esophageal reflux disease without esophagitis: Secondary | ICD-10-CM | POA: Diagnosis not present

## 2020-08-16 DIAGNOSIS — R569 Unspecified convulsions: Secondary | ICD-10-CM

## 2020-08-16 DIAGNOSIS — M6281 Muscle weakness (generalized): Secondary | ICD-10-CM | POA: Diagnosis not present

## 2020-08-16 DIAGNOSIS — R2689 Other abnormalities of gait and mobility: Secondary | ICD-10-CM | POA: Diagnosis not present

## 2020-08-16 NOTE — Assessment & Plan Note (Signed)
unwitnessed fall 08/15/20. No apparent injury.   Dementia, takes Memantine.No behaviors.the patient needs close supervision for safety due to her decreased safety awareness.

## 2020-08-16 NOTE — Assessment & Plan Note (Signed)
Dysphagia,regular diet. 

## 2020-08-16 NOTE — Progress Notes (Signed)
Location:    [place] Room Number: 15 Place of Service:  ALF [phone]) Provider: Marlana Latus NP  Virgie Dad, MD  Patient Care Team: Virgie Dad, MD as PCP - General (Internal Medicine) Terance Ice, MD (Inactive) (Cardiology)  Extended Emergency Contact Information Primary Emergency Contact: Jones Bales of [place] Phone: [phone] Relation: Friend Secondary Emergency Contact: Rondall Allegra [place] of [place] Phone: [phone] Relation: Friend  Code Status:  DNR Goals of care: Advanced Directive information Advanced Directives 07/10/2020  Does Patient Have a Medical Advance Directive? Yes  Type of Paramedic of [place];Living will  Does patient want to make changes to medical advance directive? No - Patient declined  Copy of [place] in Chart? Yes - validated most recent copy scanned in chart (See row information)  Would patient like information on creating a medical advance directive? -  Pre-existing out of facility DNR order (yellow form or pink MOST form) -     Chief Complaint  Patient presents with  . Acute Visit    Fall    HPI:  Pt is a 87 y.o. female seen today for an acute visit for unwitnessed fall 08/15/20. No apparent injury.   Dementia, takes Memantine.No behaviors.the patient needs close supervision for safety due to her decreased safety awareness.   Renovascular HTN,takingMetoprolol, Losartan, Bun/creat 18/1.0 08/01/20 Carotid artery stenosis US carotid artery: <40% internal carotid artery stenosis bilaterally, s/p endarterectomy.  CAD, PVD, takes ASA, Rosuvastatin Hyperlipidemia, takes Rosuvastatin, LDL 70 12/25/19 Postinflammatory pulmonary fibrosis, stable.  CKD Bun/creat 18/1.0 08/01/20 Dysphagia,regular diet.             Constipation, takes Senokot S,  prn Bisacodyl suppository GERD, takes Pantoprazole, Famotidine OA, general, takes Tylenol 642m qhs.  RLS, takes Requip Seizure: onset 06/26/20, negative CT, EEG, MRI, may consider Keppra if recurs, f/u Neurology   Past Medical History:  Diagnosis Date  . Anemia   . Carotid arterial disease (HCC)    asymptomatic   . Chest tightness   . Coronary artery disease   . High blood pressure   . Leg pain    with walking  . Palpitations    Past Surgical History:  Procedure Laterality Date  . CARDIAC CATHETERIZATION  07/15/04   noncritical CAD,80% PLA treated medically. EF% 60. no RAS.  .Marland KitchenCAROTID ENDARTERECTOMY     RIGHT=05/11/11, LEFT=03/30/11  . EYE SURGERY  2011   Cataract surgery Bilateral  . EYE SURGERY  2011   Bilateral eyelid surgery for ptosis  . renal artery duplex  02/23/13   ABDOMINAL AORTA:<50% DIAMETER REDUCTION. RIGHT RENAL ARTERY: 60-99% DIAMETER REDUCTION..Marland KitchenLEFT RENAL ARTERY:1-59% DIAMETER REDUCTION.    Allergies  Allergen Reactions  . Cardura [Doxazosin Mesylate] Other (See Comments)    Reaction not recalled by the patient  . Lotensin [Benazepril Hcl] Other (See Comments)    Reaction not recalled by the patient  . Tramadol Nausea And Vomiting    Allergies as of 08/16/2020      Reactions   Cardura [doxazosin Mesylate] Other (See Comments)   Reaction not recalled by the patient   Lotensin [benazepril Hcl] Other (See Comments)   Reaction not recalled by the patient   Tramadol Nausea And Vomiting      Medication List       Accurate as of August 16, 2020 11:59 PM. If you have any questions, ask your nurse or doctor.  acetaminophen 325 MG tablet Commonly known as: TYLENOL Take 650 mg by mouth at bedtime.   aspirin EC 81 MG tablet Take 1 tablet (81 mg total) by mouth daily.   bisacodyl 10 MG suppository Commonly known as: DULCOLAX Place 10 mg rectally daily as needed for moderate constipation.    famotidine 20 MG tablet Commonly known as: PEPCID TAKE 1 TABLET BY MOUTH EVERYDAY AT BEDTIME What changed: See the new instructions.   losartan 25 MG tablet Commonly known as: COZAAR Take 25 mg by mouth in the morning and at bedtime.   memantine 5 MG tablet Commonly known as: NAMENDA Take 5 mg by mouth 2 (two) times daily.   metoprolol tartrate 25 MG tablet Commonly known as: LOPRESSOR Take 25 mg by mouth 2 (two) times daily.   pantoprazole 40 MG tablet Commonly known as: PROTONIX TAKE 1 TABLET (40 MG TOTAL) BY MOUTH DAILY. TAKE 30-60 MIN BEFORE FIRST MEAL OF THE DAY What changed: additional instructions   rOPINIRole 1 MG tablet Commonly known as: REQUIP Take 1 mg by mouth every evening.   rosuvastatin 10 MG tablet Commonly known as: CRESTOR Take 10 mg by mouth at bedtime.   Vitamin D3 25 MCG tablet Commonly known as: Vitamin D Take 1,000 Units by mouth daily.   zinc oxide 20 % ointment Apply 1 application topically as needed for irritation.       Review of Systems  Constitutional: Negative for activity change, appetite change and fever.  HENT: Positive for hearing loss. Negative for congestion and trouble swallowing.   Eyes: Negative for visual disturbance.  Respiratory: Negative for chest tightness, shortness of breath and wheezing.   Cardiovascular: Negative for chest pain, palpitations and leg swelling.  Gastrointestinal: Negative for abdominal pain and constipation.  Genitourinary: Negative for dysuria, frequency and urgency.  Musculoskeletal: Positive for back pain and gait problem.       Uses walker.   Skin: Negative for color change.  Neurological: Negative for dizziness, seizures, syncope, speech difficulty, weakness, light-headedness and headaches.  Psychiatric/Behavioral: Negative for behavioral problems and sleep disturbance. The patient is not nervous/anxious.     Immunization History  Administered Date(s) Administered  . Influenza Split  04/19/2014, 04/20/2015  . Influenza Whole 04/19/2016  . Influenza,inj,Quad PF,6+ Mos 04/14/2018  . Influenza-Unspecified 04/23/2020  . Moderna Sars-Covid-2 Vaccination 07/24/2019, 08/21/2019, 06/03/2020  . Pneumococcal Conjugate-13 08/23/2014  . Tdap 09/21/2017   Pertinent  Health Maintenance Due  Topic Date Due  . PNA vac Low Risk Adult (2 of 2 - PPSV23) 08/24/2015  . INFLUENZA VACCINE  Completed  . DEXA SCAN  Completed   Fall Risk  03/24/2016  Falls in the past year? No  Comment Emmi Telephone Survey: data to providers prior to load   Functional Status Survey:    Vitals:   08/16/20 1524  BP: (!) 170/80  Pulse: 64  Resp: 20  Temp: (!) 97.4 F (36.3 C)  SpO2: 93%  Weight: 96 lb 6.4 oz (43.7 kg)  Height: 5' 2"  (1.575 m)   Body mass index is 17.63 kg/m. Physical Exam Vitals and nursing note reviewed.  Constitutional:      Appearance: Normal appearance.  HENT:     Head: Normocephalic and atraumatic.     Mouth/Throat:     Mouth: Mucous membranes are moist.  Eyes:     Extraocular Movements: Extraocular movements intact.     Conjunctiva/sclera: Conjunctivae normal.     Pupils: Pupils are equal, round, and reactive to light.  Cardiovascular:  Rate and Rhythm: Normal rate and regular rhythm.     Heart sounds: No murmur heard.   Pulmonary:     Effort: Pulmonary effort is normal.     Breath sounds: Rales present. No wheezing or rhonchi.     Comments: Bibasilar rales.  Abdominal:     General: Bowel sounds are normal.     Palpations: Abdomen is soft.     Tenderness: There is no abdominal tenderness.  Musculoskeletal:     Cervical back: Normal range of motion and neck supple.     Right lower leg: No edema.     Left lower leg: No edema.     Comments: R+L SIJ aches is better controlled on Tylenol   Skin:    General: Skin is warm and dry.  Neurological:     General: No focal deficit present.     Mental Status: She is alert. Mental status is at baseline.      Motor: No weakness.     Coordination: Coordination normal.     Gait: Gait abnormal.     Comments: Oriented to person, place. Furniture, rails walking sometimes.  Psychiatric:        Mood and Affect: Mood normal.        Behavior: Behavior normal.     Labs reviewed: Recent Labs    06/26/20 0830 06/27/20 0413 06/27/20 1729 08/01/20 0000  NA 134* 135  --  139  K 3.9 3.0*  --  4.4  CL 93* 95*  --  100  CO2 29 25  --  28*  GLUCOSE 130* 92 291*  --   BUN 21 13  --  18  CREATININE 0.89 0.85  --  1.0  CALCIUM 10.0 9.5  --  10.0   Recent Labs    11/09/19 1441 03/28/20 0000 06/26/20 0830 06/27/20 0413 08/01/20 0000  AST 28   < > 23 35 31  ALT 54*   < > 20 20 48*  ALKPHOS 82   < > 60 56 99  BILITOT 0.7  --  0.8 1.5*  --   PROT 6.4*  --  6.5 6.1*  --   ALBUMIN 2.8*   < > 3.9 3.5 4.0   < > = values in this interval not displayed.   Recent Labs    11/09/19 1441 03/28/20 0000 06/26/20 0830 06/27/20 0413 08/01/20 0000  WBC 10.0   < > 12.6* 10.7* 7.6  NEUTROABS 7.7  --  11.2*  --  6,080.00  HGB 11.4*   < > 14.1 14.2 14.6  HCT 36.3   < > 42.4 40.3 45  MCV 91.9  --  94.2 90.6  --   PLT 299   < > 193 155 204   < > = values in this interval not displayed.   Lab Results  Component Value Date   TSH 2.130 11/02/2019   Lab Results  Component Value Date   HGBA1C 6.3 (H) 11/02/2019   No results found for: CHOL, HDL, LDLCALC, LDLDIRECT, TRIG, CHOLHDL  Significant Diagnostic Results in last 30 days:  No results found.  Assessment/Plan Fall unwitnessed fall 08/15/20. No apparent injury.   Dementia, takes Memantine.No behaviors.the patient needs close supervision for safety due to her decreased safety awareness.    Mixed dementia ([place]) Dementia, takes Memantine.No behaviors.the patient needs close supervision for safety due to her decreased safety awareness. She is more confused, hx of head trauma, no new focal neurological symptoms, will update CBC/diff, CMP/eGFR.  Observe.    Renovascular hypertension Renovascular HTN,loose Bp control, takingMetoprolol, Losartan, Bun/creat 18/1.0 08/01/20   Carotid stenosis Carotid artery stenosis US carotid artery: <40% internal carotid artery stenosis bilaterally, s/p endarterectomy.  CAD (coronary artery disease) CAD, PVD, takes ASA, Rosuvastatin  Hyperlipidemia Hyperlipidemia, takes Rosuvastatin, LDL 70 12/25/19  Postinflammatory pulmonary fibrosis (HCC) Postinflammatory pulmonary fibrosis, stable.    CKD (chronic kidney disease) stage 3, GFR 30-59 ml/min (HCC) CKD Bun/creat 18/1.0 08/01/20   Dysphagia Dysphagia,regular diet.  Slow transit constipation Constipation, takes Senokot S, prn Bisacodyl suppository   GERD (gastroesophageal reflux disease) GERD, takes Pantoprazole, Famotidine  Lower back pain OA, general, takes Tylenol 69m qhs.  Restless leg syndrome RLS, takes Requip   New onset seizure without head trauma ([facility] Seizure: onset 06/26/20, negative CT, EEG, MRI, may consider Keppra if recurs, f/u Neurology     Family/ staff Communication: plan of care reviewed with the patient and charge nurse.   Labs/tests ordered:  CBC/diff, CMP/eGFR   Time spend 40 minutes.

## 2020-08-16 NOTE — Assessment & Plan Note (Signed)
Constipation, takes Senokot S, prn Bisacodyl suppository

## 2020-08-16 NOTE — Assessment & Plan Note (Addendum)
Renovascular HTN,loose Bp control, takingMetoprolol, Losartan, Bun/creat 18/1.0 08/01/20

## 2020-08-16 NOTE — Assessment & Plan Note (Signed)
OA, general, takes Tylenol 650mg  qhs.

## 2020-08-16 NOTE — Assessment & Plan Note (Addendum)
Dementia, takes Memantine.No behaviors.the patient needs close supervision for safety due to her decreased safety awareness. She is more confused, hx of head trauma, no new focal neurological symptoms, will update CBC/diff, CMP/eGFR. Observe.

## 2020-08-16 NOTE — Assessment & Plan Note (Signed)
Hyperlipidemia, takes Rosuvastatin, LDL 70 12/25/19

## 2020-08-16 NOTE — Assessment & Plan Note (Signed)
Postinflammatory pulmonary fibrosis, stable.

## 2020-08-16 NOTE — Assessment & Plan Note (Signed)
Carotidarterystenosis US carotid artery: <40% internal carotid artery stenosis bilaterally, s/p endarterectomy.  

## 2020-08-16 NOTE — Assessment & Plan Note (Signed)
RLS, takes Requip  

## 2020-08-16 NOTE — Assessment & Plan Note (Signed)
GERD, takes Pantoprazole, Famotidine 

## 2020-08-16 NOTE — Assessment & Plan Note (Signed)
CAD, PVD, takes ASA, Rosuvastatin

## 2020-08-16 NOTE — Assessment & Plan Note (Addendum)
CKD Bun/creat 18/1.0 08/01/20  

## 2020-08-16 NOTE — Assessment & Plan Note (Addendum)
Seizure: onset 06/26/20, negative CT, EEG, MRI, may consider Keppra if recurs, f/u Neurology

## 2020-08-19 ENCOUNTER — Non-Acute Institutional Stay: Payer: Medicare Other | Admitting: Internal Medicine

## 2020-08-19 ENCOUNTER — Encounter: Payer: Self-pay | Admitting: Internal Medicine

## 2020-08-19 DIAGNOSIS — R2681 Unsteadiness on feet: Secondary | ICD-10-CM | POA: Diagnosis not present

## 2020-08-19 DIAGNOSIS — R0602 Shortness of breath: Secondary | ICD-10-CM

## 2020-08-19 DIAGNOSIS — Z9181 History of falling: Secondary | ICD-10-CM | POA: Diagnosis not present

## 2020-08-19 DIAGNOSIS — M6281 Muscle weakness (generalized): Secondary | ICD-10-CM | POA: Diagnosis not present

## 2020-08-19 DIAGNOSIS — R2689 Other abnormalities of gait and mobility: Secondary | ICD-10-CM | POA: Diagnosis not present

## 2020-08-19 DIAGNOSIS — G309 Alzheimer's disease, unspecified: Secondary | ICD-10-CM | POA: Diagnosis not present

## 2020-08-19 DIAGNOSIS — R4789 Other speech disturbances: Secondary | ICD-10-CM | POA: Diagnosis not present

## 2020-08-19 NOTE — Progress Notes (Signed)
Location:    [facility] Room Number: 15 Place of Service:  ALF [phone]) Provider:  Einar Crow MD  Mahlon Gammon, MD  Patient Care Team: Mahlon Gammon, MD as PCP - General (Internal Medicine) Susa Griffins, MD (Inactive) (Cardiology)  Extended Emergency Contact Information Primary Emergency Contact: Odelia Gage of [place] Home Phone: [phone] Relation: Friend Secondary Emergency Contact: Florian [place] of [place] Home Phone: [phone] Relation: Friend  Code Status:  DNR Goals of care: Advanced Directive information Advanced Directives 07/10/2020  Does Patient Have a Medical Advance Directive? Yes  Type of Estate agent of [place];Living will  Does patient want to make changes to medical advance directive? No - Patient declined  Copy of Healthcare Power of Attorney in Chart? Yes - validated most recent copy scanned in chart (See row information)  Would patient like information on creating a medical advance directive? -  Pre-existing out of facility DNR order (yellow form or pink MOST form) -     Chief Complaint  Patient presents with  . Acute Visit    SOB    HPI:  Pt is a 87 y.o. female seen today for an acute visit for SOB and Cough  [place]   Patient has h/o Hypertension, Hyperlipidemia, CAD, MVP, Restless Leg, Hiatal Hernia, Osteoporosisand Arthritis Admitted in hospital from 12/08 - 12/10 for Possible seizure.  This morning she told Nurses she has been feeling SOB and Cough No Fever Chest pain  Covid tested was negative   Past Medical History:  Diagnosis Date  . Anemia   . Carotid arterial disease (HCC)    asymptomatic   . Chest tightness   . Coronary artery disease   . High blood pressure   . Leg pain    with walking  . Palpitations    Past Surgical History:  Procedure Laterality Date  . CARDIAC CATHETERIZATION  07/15/04   noncritical CAD,80%  PLA treated medically. EF% 60. no RAS.  [name] CAROTID ENDARTERECTOMY     RIGHT=05/11/11, LEFT=03/30/11  . EYE SURGERY  2011   Cataract surgery Bilateral  . EYE SURGERY  2011   Bilateral eyelid surgery for ptosis  . renal artery duplex  02/23/13   ABDOMINAL AORTA:<50% DIAMETER REDUCTION. RIGHT RENAL ARTERY: 60-99% DIAMETER REDUCTION.[name] LEFT RENAL ARTERY:1-59% DIAMETER REDUCTION.    Allergies  Allergen Reactions  . Cardura [Doxazosin Mesylate] Other (See Comments)    Reaction not recalled by the patient  . Lotensin [Benazepril Hcl] Other (See Comments)    Reaction not recalled by the patient  . Tramadol Nausea And Vomiting    Allergies as of 08/19/2020      Reactions   Cardura [doxazosin Mesylate] Other (See Comments)   Reaction not recalled by the patient   Lotensin [benazepril Hcl] Other (See Comments)   Reaction not recalled by the patient   Tramadol Nausea And Vomiting      Medication List       Accurate as of August 19, 2020  7:05 PM. If you have any questions, ask your nurse or doctor.        acetaminophen 325 MG tablet Commonly known as: TYLENOL Take 650 mg by mouth at bedtime.   aspirin EC 81 MG tablet Take 1 tablet (81 mg total) by mouth daily.   bisacodyl 10 MG suppository Commonly known as: DULCOLAX Place 10 mg rectally daily as needed for moderate constipation.   famotidine 20 MG tablet Commonly known  as: PEPCID TAKE 1 TABLET BY MOUTH EVERYDAY AT BEDTIME What changed: See the new instructions.   losartan 25 MG tablet Commonly known as: COZAAR Take 25 mg by mouth in the morning and at bedtime.   memantine 5 MG tablet Commonly known as: NAMENDA Take 5 mg by mouth 2 (two) times daily.   metoprolol tartrate 25 MG tablet Commonly known as: LOPRESSOR Take 25 mg by mouth 2 (two) times daily.   pantoprazole 40 MG tablet Commonly known as: PROTONIX Take 40 mg by mouth daily. What changed: Another medication with the same name was removed. Continue taking  this medication, and follow the directions you see here. Changed by: Mahlon Gammon, MD   rOPINIRole 1 MG tablet Commonly known as: REQUIP Take 1 mg by mouth every evening.   rosuvastatin 10 MG tablet Commonly known as: CRESTOR Take 10 mg by mouth at bedtime.   Vitamin D3 25 MCG tablet Commonly known as: Vitamin D Take 1,000 Units by mouth daily.   zinc oxide 20 % ointment Apply 1 application topically as needed for irritation.       Review of Systems  Denies everything now Even SOB  but does have dementia  Immunization History  Administered Date(s) Administered  . Influenza Split 04/19/2014, 04/20/2015  . Influenza Whole 04/19/2016  . Influenza,inj,Quad PF,6+ Mos 04/14/2018  . Influenza-Unspecified 04/23/2020  . Moderna Sars-Covid-2 Vaccination 07/24/2019, 08/21/2019, 06/03/2020  . Pneumococcal Conjugate-13 08/23/2014  . Tdap 09/21/2017   Pertinent  Health Maintenance Due  Topic Date Due  . PNA vac Low Risk Adult (2 of 2 - PPSV23) 08/24/2015  . INFLUENZA VACCINE  Completed  . DEXA SCAN  Completed   Fall Risk  03/24/2016  Falls in the past year? No  Comment Emmi Telephone Survey: data to providers prior to load   Functional Status Survey:    Vitals:   08/19/20 1033  BP: (!) 168/92  Pulse: 65  Resp: 20  Temp: (!) 97.5 F (36.4 C)  SpO2: 95%  Weight: 107 lb (48.5 kg)  Height: 5\' 2"  (1.575 m)   Body mass index is 19.57 kg/m. Physical Exam  Constitutional:  Well-developed and well-nourished.  HENT:  Head: Normocephalic.  Mouth/Throat: Oropharynx is clear and moist.  Eyes: Pupils are equal, round, and reactive to light.  Neck: Neck supple.  Cardiovascular: Normal rate and normal heart sounds.  No murmur heard. Pulmonary/Chest: Effort normal and breath sounds normal. No respiratory distress. No wheezes. Has Fine Rales Bilateral  Abdominal: Soft. Bowel sounds are normal. No distension. There is no tenderness. There is no rebound.  Musculoskeletal:Mild  edema Bilateral Lymphadenopathy: none Neurological:No Focal Deficits Skin: Skin is warm and dry.  Psychiatric: Normal mood and affect. Behavior is normal. Thought content normal.    Labs reviewed: Recent Labs    06/26/20 0830 06/27/20 0413 06/27/20 1729 08/01/20 0000  NA 134* 135  --  139  K 3.9 3.0*  --  4.4  CL 93* 95*  --  100  CO2 29 25  --  28*  GLUCOSE 130* 92 291*  --   BUN 21 13  --  18  CREATININE 0.89 0.85  --  1.0  CALCIUM 10.0 9.5  --  10.0   Recent Labs    11/09/19 1441 03/28/20 0000 06/26/20 0830 06/27/20 0413 08/01/20 0000  AST 28   < > 23 35 31  ALT 54*   < > 20 20 48*  ALKPHOS 82   < > 60 56  99  BILITOT 0.7  --  0.8 1.5*  --   PROT 6.4*  --  6.5 6.1*  --   ALBUMIN 2.8*   < > 3.9 3.5 4.0   < > = values in this interval not displayed.   Recent Labs    11/09/19 1441 03/28/20 0000 06/26/20 0830 06/27/20 0413 08/01/20 0000  WBC 10.0   < > 12.6* 10.7* 7.6  NEUTROABS 7.7  --  11.2*  --  6,080.00  HGB 11.4*   < > 14.1 14.2 14.6  HCT 36.3   < > 42.4 40.3 45  MCV 91.9  --  94.2 90.6  --   PLT 299   < > 193 155 204   < > = values in this interval not displayed.   Lab Results  Component Value Date   TSH 2.130 11/02/2019   Lab Results  Component Value Date   HGBA1C 6.3 (H) 11/02/2019   No results found for: CHOL, HDL, LDLCALC, LDLDIRECT, TRIG, CHOLHDL  Significant Diagnostic Results in last 30 days:  No results found.  Assessment/Plan SOB (shortness of breath) POX now more then 90% on RA Covid Negative Chest Xray ordered to rule out Pulmonary congestion as she has gained 10 lbs and has Edema Bilateral  Other Issues  Renovascular hypertension On Cozaar and Metoprolol Loose control of BP  New onset seizure without head trauma (HCC) No Treatment yet by Neurology  If one more episode will consdier Keppra  Mixed hyperlipidemia On Crestor Stage 3b chronic kidney disease (HCC) Creat stable Mixed dementia (HCC) MRI in the past is  negative On Namenda Restless leg On requip Osteoporosis Refuses treatment Was on Prolia before   Family/ staff Communication:   Labs/tests ordered:

## 2020-08-20 ENCOUNTER — Encounter: Payer: Self-pay | Admitting: Nurse Practitioner

## 2020-08-20 DIAGNOSIS — M6281 Muscle weakness (generalized): Secondary | ICD-10-CM | POA: Diagnosis not present

## 2020-08-20 DIAGNOSIS — R4789 Other speech disturbances: Secondary | ICD-10-CM | POA: Diagnosis not present

## 2020-08-20 DIAGNOSIS — R2689 Other abnormalities of gait and mobility: Secondary | ICD-10-CM | POA: Diagnosis not present

## 2020-08-20 DIAGNOSIS — G309 Alzheimer's disease, unspecified: Secondary | ICD-10-CM | POA: Diagnosis not present

## 2020-08-20 DIAGNOSIS — R2681 Unsteadiness on feet: Secondary | ICD-10-CM | POA: Diagnosis not present

## 2020-08-20 DIAGNOSIS — Z9181 History of falling: Secondary | ICD-10-CM | POA: Diagnosis not present

## 2020-08-21 DIAGNOSIS — Z9181 History of falling: Secondary | ICD-10-CM | POA: Diagnosis not present

## 2020-08-21 DIAGNOSIS — R4789 Other speech disturbances: Secondary | ICD-10-CM | POA: Diagnosis not present

## 2020-08-21 DIAGNOSIS — R2681 Unsteadiness on feet: Secondary | ICD-10-CM | POA: Diagnosis not present

## 2020-08-21 DIAGNOSIS — M6281 Muscle weakness (generalized): Secondary | ICD-10-CM | POA: Diagnosis not present

## 2020-08-21 DIAGNOSIS — G309 Alzheimer's disease, unspecified: Secondary | ICD-10-CM | POA: Diagnosis not present

## 2020-08-21 DIAGNOSIS — R2689 Other abnormalities of gait and mobility: Secondary | ICD-10-CM | POA: Diagnosis not present

## 2020-08-22 ENCOUNTER — Encounter: Payer: Self-pay | Admitting: Internal Medicine

## 2020-08-22 ENCOUNTER — Non-Acute Institutional Stay: Payer: Medicare Other | Admitting: Internal Medicine

## 2020-08-22 DIAGNOSIS — G309 Alzheimer's disease, unspecified: Secondary | ICD-10-CM

## 2020-08-22 DIAGNOSIS — I2510 Atherosclerotic heart disease of native coronary artery without angina pectoris: Secondary | ICD-10-CM | POA: Diagnosis not present

## 2020-08-22 DIAGNOSIS — R0602 Shortness of breath: Secondary | ICD-10-CM | POA: Diagnosis not present

## 2020-08-22 DIAGNOSIS — R2689 Other abnormalities of gait and mobility: Secondary | ICD-10-CM | POA: Diagnosis not present

## 2020-08-22 DIAGNOSIS — F0280 Dementia in other diseases classified elsewhere without behavioral disturbance: Secondary | ICD-10-CM | POA: Diagnosis not present

## 2020-08-22 DIAGNOSIS — E782 Mixed hyperlipidemia: Secondary | ICD-10-CM

## 2020-08-22 DIAGNOSIS — N1832 Chronic kidney disease, stage 3b: Secondary | ICD-10-CM

## 2020-08-22 DIAGNOSIS — R4789 Other speech disturbances: Secondary | ICD-10-CM | POA: Diagnosis not present

## 2020-08-22 DIAGNOSIS — F0150 Vascular dementia without behavioral disturbance: Secondary | ICD-10-CM | POA: Diagnosis not present

## 2020-08-22 DIAGNOSIS — I150 Renovascular hypertension: Secondary | ICD-10-CM | POA: Diagnosis not present

## 2020-08-22 DIAGNOSIS — F0390 Unspecified dementia without behavioral disturbance: Secondary | ICD-10-CM | POA: Diagnosis not present

## 2020-08-22 DIAGNOSIS — Z9181 History of falling: Secondary | ICD-10-CM | POA: Diagnosis not present

## 2020-08-22 DIAGNOSIS — R2681 Unsteadiness on feet: Secondary | ICD-10-CM | POA: Diagnosis not present

## 2020-08-22 DIAGNOSIS — D649 Anemia, unspecified: Secondary | ICD-10-CM | POA: Diagnosis not present

## 2020-08-22 DIAGNOSIS — M6281 Muscle weakness (generalized): Secondary | ICD-10-CM | POA: Diagnosis not present

## 2020-08-22 LAB — HEPATIC FUNCTION PANEL
ALT: 24 (ref 7–35)
AST: 15 (ref 13–35)
Alkaline Phosphatase: 101 (ref 25–125)
Bilirubin, Total: 0.5

## 2020-08-22 LAB — CBC AND DIFFERENTIAL
HCT: 42 (ref 36–46)
Hemoglobin: 13.7 (ref 12.0–16.0)
Neutrophils Absolute: 5421
Platelets: 175 (ref 150–399)
WBC: 7.7

## 2020-08-22 LAB — BASIC METABOLIC PANEL
BUN: 22 — AB (ref 4–21)
CO2: 25 — AB (ref 13–22)
Chloride: 100 (ref 99–108)
Creatinine: 1 (ref 0.5–1.1)
Glucose: 92
Potassium: 4.3 (ref 3.4–5.3)
Sodium: 135 — AB (ref 137–147)

## 2020-08-22 LAB — CBC: RBC: 4.35 (ref 3.87–5.11)

## 2020-08-22 LAB — COMPREHENSIVE METABOLIC PANEL
Albumin: 3.2 — AB (ref 3.5–5.0)
Calcium: 9.1 (ref 8.7–10.7)
Globulin: 1.7

## 2020-08-22 NOTE — Progress Notes (Signed)
[facility] of Service:  ALF (13)  Provider:   Code Status:  Goals of Care:  Advanced Directives 07/10/2020  Does Patient Have a Medical Advance Directive? Yes  Type of Estate agent of [place];Living will  Does patient want to make changes to medical advance directive? No - Patient declined  Copy of Healthcare Power of Attorney in Chart? Yes - validated most recent copy scanned in chart (See row information)  Would patient like information on creating a medical advance directive? -  Pre-existing out of facility DNR order (yellow form or pink MOST form) -     Chief Complaint  Patient presents with  . Acute Visit    HPI: Patient is a 87 y.o. female seen today for an acute visit for SOB  [place]   Patient has h/o Hypertension, Hyperlipidemia, CAD, MVP, Restless Leg, Hiatal Hernia, Osteoporosisand Arthritis Admitted in hospital from 12/08 - 12/10 for Possible seizure.  Was seen recently for Cough and SOB Chest Xray was negative for any acute disease She does have Chronic Lung Changes Possible ILD Also has Raynauds in [place] in her finger  When I ask her she denies SOB but Nurses reporting POX of 70 % on RA 95 % on 2 Litres She is Rapid Covid negative No Fever or Cough today No Chest pain  Past Medical History:  Diagnosis Date  . Anemia   . Carotid arterial disease (HCC)    asymptomatic   . Chest tightness   . Coronary artery disease   . High blood pressure   . Leg pain    with walking  . Palpitations     Past Surgical History:  Procedure Laterality Date  . CARDIAC CATHETERIZATION  07/15/04   noncritical CAD,80% PLA treated medically. EF% 60. no RAS.  [name] CAROTID ENDARTERECTOMY     RIGHT=05/11/11, LEFT=03/30/11  . EYE SURGERY  2011   Cataract surgery Bilateral  . EYE SURGERY  2011   Bilateral eyelid surgery for ptosis  . renal artery duplex  02/23/13   ABDOMINAL AORTA:<50% DIAMETER  REDUCTION. RIGHT RENAL ARTERY: 60-99% DIAMETER REDUCTION.[name] LEFT RENAL ARTERY:1-59% DIAMETER REDUCTION.    Allergies  Allergen Reactions  . Cardura [Doxazosin Mesylate] Other (See Comments)    Reaction not recalled by the patient  . Lotensin [Benazepril Hcl] Other (See Comments)    Reaction not recalled by the patient  . Tramadol Nausea And Vomiting    Outpatient Encounter Medications as of 08/22/2020  Medication Sig  . acetaminophen (TYLENOL) 325 MG tablet Take 650 mg by mouth at bedtime.  [name] aspirin EC 81 MG tablet Take 1 tablet (81 mg total) by mouth daily.  . bisacodyl (DULCOLAX) 10 MG suppository Place 10 mg rectally daily as needed for moderate constipation.  . famotidine (PEPCID) 20 MG tablet TAKE 1 TABLET BY MOUTH EVERYDAY AT BEDTIME (Patient taking differently: Take 20 mg by mouth at bedtime.)  . losartan (COZAAR) 25 MG tablet Take 25 mg by mouth in the morning and at bedtime.  . memantine (NAMENDA) 5 MG tablet Take 5 mg by mouth 2 (two) times daily.  . metoprolol tartrate (LOPRESSOR) 25 MG tablet Take 12.5 mg by mouth 2 (two) times daily.  . pantoprazole (PROTONIX) 40 MG tablet Take 40 mg by mouth daily.  [name] rOPINIRole (REQUIP) 1 MG tablet Take 1 mg by mouth every evening.  . rosuvastatin (CRESTOR) 10 MG tablet Take 10 mg by mouth at bedtime.   [name]  Vitamin D3 (VITAMIN D) 25 MCG tablet Take 1,000 Units by mouth daily.  [name] zinc oxide 20 % ointment Apply 1 application topically as needed for irritation.   No facility-administered encounter medications on file as of 08/22/2020.    Review of Systems:  Review of Systems  Patient denies everything but does have Significant dementia   Health Maintenance  Topic Date Due  . PNA vac Low Risk Adult (2 of 2 - PPSV23) 08/24/2015  . TETANUS/TDAP  09/22/2027  . INFLUENZA VACCINE  Completed  . DEXA SCAN  Completed  . COVID-19 Vaccine  Completed    Physical Exam: Vitals:   08/22/20 1426  BP: 118/90  Pulse: 64  Resp: 16  Temp: (!) 97 F  (36.1 C)  SpO2: 95%   There is no height or weight on file to calculate BMI. Physical Exam Vitals reviewed.  Constitutional:      Appearance: Normal appearance.     Comments: Does not seem in Acute distress  HENT:     Head: Normocephalic.     Nose: Nose normal.     Mouth/Throat:     Mouth: Mucous membranes are moist.     Pharynx: Oropharynx is clear.  Eyes:     Pupils: Pupils are equal, round, and reactive to light.  Cardiovascular:     Rate and Rhythm: Normal rate.     Pulses: Normal pulses.  Pulmonary:     Effort: Pulmonary effort is normal.     Breath sounds: Rales present.     Comments: Has rales Bilateral No Wheezing Abdominal:     General: Abdomen is flat. Bowel sounds are normal.     Palpations: Abdomen is soft.  Musculoskeletal:        General: Swelling present.     Cervical back: Neck supple.  Skin:    General: Skin is warm.  Neurological:     General: No focal deficit present.     Mental Status: She is alert.  Psychiatric:        Mood and Affect: Mood normal.        Thought Content: Thought content normal.     Labs reviewed: Basic Metabolic Panel: Recent Labs    11/02/19 1122 11/09/19 1441 06/26/20 0830 06/27/20 0413 06/27/20 1729 08/01/20 0000  NA  --    < > 134* 135  --  139  K  --    < > 3.9 3.0*  --  4.4  CL  --    < > 93* 95*  --  100  CO2  --    < > 29 25  --  28*  GLUCOSE  --    < > 130* 92 291*  --   BUN  --    < > 21 13  --  18  CREATININE  --    < > 0.89 0.85  --  1.0  CALCIUM  --    < > 10.0 9.5  --  10.0  TSH 2.130  --   --   --   --   --    < > = values in this interval not displayed.   Liver Function Tests: Recent Labs    11/09/19 1441 03/28/20 0000 06/26/20 0830 06/27/20 0413 08/01/20 0000  AST 28   < > 23 35 31  ALT 54*   < > 20 20 48*  ALKPHOS 82   < > 60 56 99  BILITOT 0.7  --  0.8 1.5*  --  PROT 6.4*  --  6.5 6.1*  --   ALBUMIN 2.8*   < > 3.9 3.5 4.0   < > = values in this interval not displayed.   No  results for input(s): LIPASE, AMYLASE in the last 8760 hours. No results for input(s): AMMONIA in the last 8760 hours. CBC: Recent Labs    11/09/19 1441 03/28/20 0000 06/26/20 0830 06/27/20 0413 08/01/20 0000  WBC 10.0   < > 12.6* 10.7* 7.6  NEUTROABS 7.7  --  11.2*  --  6,080.00  HGB 11.4*   < > 14.1 14.2 14.6  HCT 36.3   < > 42.4 40.3 45  MCV 91.9  --  94.2 90.6  --   PLT 299   < > 193 155 204   < > = values in this interval not displayed.   Lipid Panel: No results for input(s): CHOL, HDL, LDLCALC, TRIG, CHOLHDL, LDLDIRECT in the last 8760 hours. Lab Results  Component Value Date   HGBA1C 6.3 (H) 11/02/2019    Procedures since last visit: No results found.  Assessment/Plan 1. SOB (shortness of breath) ? Etiology Chest Xray was negative for Acute Changes Does have Chronic Lung Disease Seem Fluid Overload Will try Lasix 20 mg QD Also ordered 2d Echo  2. Renovascular hypertension Change Metoprolol to 12.5 mg BID since starting in Lasix Want Passive Hypertension Continue Cozaar  3. Mixed dementia (HCC) Worsening confusion On Namenda MRI negative in past  New onset seizure without head trauma (HCC) No Treatment yet by Neurology  If one more episode will consider Keppra  Mixed hyperlipidemia On Crestor Stage 3b chronic kidney disease (HCC) Creat stable  Restless leg On requip Osteoporosis Refuses treatment Was on Prolia before   Labs/tests ordered: BMP in 1 week

## 2020-08-23 ENCOUNTER — Non-Acute Institutional Stay: Payer: Medicare Other | Admitting: Nurse Practitioner

## 2020-08-23 ENCOUNTER — Encounter: Payer: Self-pay | Admitting: Nurse Practitioner

## 2020-08-23 DIAGNOSIS — R2689 Other abnormalities of gait and mobility: Secondary | ICD-10-CM | POA: Diagnosis not present

## 2020-08-23 DIAGNOSIS — G2581 Restless legs syndrome: Secondary | ICD-10-CM

## 2020-08-23 DIAGNOSIS — G4700 Insomnia, unspecified: Secondary | ICD-10-CM

## 2020-08-23 DIAGNOSIS — M6281 Muscle weakness (generalized): Secondary | ICD-10-CM | POA: Diagnosis not present

## 2020-08-23 DIAGNOSIS — R1310 Dysphagia, unspecified: Secondary | ICD-10-CM | POA: Diagnosis not present

## 2020-08-23 DIAGNOSIS — I150 Renovascular hypertension: Secondary | ICD-10-CM

## 2020-08-23 DIAGNOSIS — Z9181 History of falling: Secondary | ICD-10-CM | POA: Diagnosis not present

## 2020-08-23 DIAGNOSIS — I779 Disorder of arteries and arterioles, unspecified: Secondary | ICD-10-CM

## 2020-08-23 DIAGNOSIS — K219 Gastro-esophageal reflux disease without esophagitis: Secondary | ICD-10-CM

## 2020-08-23 DIAGNOSIS — E782 Mixed hyperlipidemia: Secondary | ICD-10-CM | POA: Diagnosis not present

## 2020-08-23 DIAGNOSIS — M545 Low back pain, unspecified: Secondary | ICD-10-CM

## 2020-08-23 DIAGNOSIS — K5901 Slow transit constipation: Secondary | ICD-10-CM | POA: Diagnosis not present

## 2020-08-23 DIAGNOSIS — J8410 Pulmonary fibrosis, unspecified: Secondary | ICD-10-CM

## 2020-08-23 DIAGNOSIS — R0902 Hypoxemia: Secondary | ICD-10-CM | POA: Insufficient documentation

## 2020-08-23 DIAGNOSIS — I2510 Atherosclerotic heart disease of native coronary artery without angina pectoris: Secondary | ICD-10-CM

## 2020-08-23 DIAGNOSIS — R600 Localized edema: Secondary | ICD-10-CM

## 2020-08-23 DIAGNOSIS — R2681 Unsteadiness on feet: Secondary | ICD-10-CM | POA: Diagnosis not present

## 2020-08-23 DIAGNOSIS — G309 Alzheimer's disease, unspecified: Secondary | ICD-10-CM | POA: Diagnosis not present

## 2020-08-23 DIAGNOSIS — R569 Unspecified convulsions: Secondary | ICD-10-CM

## 2020-08-23 DIAGNOSIS — R601 Generalized edema: Secondary | ICD-10-CM | POA: Diagnosis not present

## 2020-08-23 DIAGNOSIS — R4789 Other speech disturbances: Secondary | ICD-10-CM | POA: Diagnosis not present

## 2020-08-23 DIAGNOSIS — N1832 Chronic kidney disease, stage 3b: Secondary | ICD-10-CM | POA: Diagnosis not present

## 2020-08-23 DIAGNOSIS — F0280 Dementia in other diseases classified elsewhere without behavioral disturbance: Secondary | ICD-10-CM

## 2020-08-23 DIAGNOSIS — F0150 Vascular dementia without behavioral disturbance: Secondary | ICD-10-CM

## 2020-08-23 NOTE — Assessment & Plan Note (Signed)
RLS, takes Requip

## 2020-08-23 NOTE — Assessment & Plan Note (Signed)
Constipation, takes Senokot S, prn Bisacodyl suppository  

## 2020-08-23 NOTE — Assessment & Plan Note (Signed)
persisted O2 desaturation, pending echocardiogram, BLE edema 3+ RLE, 2+LLE, started Furosemide 20mg  qd yesterday 08/22/20. CXR 08/19/20 chronic interstitial lund disease.  Will repeat CXR ap/lateral to evaluate any new cardiopulmonary process, O2 2lpm via [place] to maintain Sat O2>90%, EKG given Hx of CAD. Observe.

## 2020-08-23 NOTE — Assessment & Plan Note (Signed)
New, did not sleep last night, restlessness, will have Lorazepam 0.5mg  q8hr prn x 5 days. Observe.

## 2020-08-23 NOTE — Assessment & Plan Note (Addendum)
New 3+ RLE, 2+ LLE, weight gained about #3Ibs in the past 5 days, negative Homans sign, will obtain venous US BLE to r/o DVT, continue Furosemide.

## 2020-08-23 NOTE — Assessment & Plan Note (Signed)
Hyperlipidemia, takes Rosuvastatin, LDL 70 12/25/19 

## 2020-08-23 NOTE — Assessment & Plan Note (Signed)
GERD, takes Pantoprazole, Famotidine

## 2020-08-23 NOTE — Assessment & Plan Note (Signed)
Dementia, takes Memantine.No behaviors.the patient needs close supervision for safety due to her decreased safety awareness.  

## 2020-08-23 NOTE — Assessment & Plan Note (Signed)
Postinflammatory pulmonary fibrosis, Hypoxia, new.

## 2020-08-23 NOTE — Progress Notes (Signed)
Location:  [facility] Room Number: 15 Place of Service:  ALF [phone]) Provider: Chipper Oman NP  Mahlon Gammon, MD  Patient Care Team: Mahlon Gammon, MD as PCP - General (Internal Medicine) Susa Griffins, MD (Inactive) (Cardiology)  Extended Emergency Contact Information Primary Emergency Contact: Odelia Gage of [place] Home Phone: [phone] Relation: Friend Secondary Emergency Contact: Florian [place] of [place] Home Phone: [phone] Relation: Friend  Code Status:  DNR Goals of care: Advanced Directive information Advanced Directives 07/10/2020  Does Patient Have a Medical Advance Directive? Yes  Type of Estate agent of [place];Living will  Does patient want to make changes to medical advance directive? No - Patient declined  Copy of Healthcare Power of Attorney in Chart? Yes - validated most recent copy scanned in chart (See row information)  Would patient like information on creating a medical advance directive? -  Pre-existing out of facility DNR order (yellow form or pink MOST form) -     Chief Complaint  Patient presents with  . Acute Visit    Insomnia    HPI:  Pt is a 87 y.o. female seen today for an acute visit for not sleeping last night, persisted O2 desaturation, pending echocardiogram, BLE edema 3+ RLE, 2+LLE, started Furosemide 20mg  qd yesterday 08/22/20. CXR 08/19/20 chronic interstitial lund disease.   Dementia, takes Memantine.No behaviors.the patient needs close supervision for safety due to her decreased safety awareness.              Renovascular HTN,takingMetoprolol, Losartan, Bun/creat 18/1.0 08/01/20 Carotidarterystenosis 08/03/20 carotid artery: <40% internal carotid artery stenosis bilaterally, s/p endarterectomy.  CAD, PVD, takes ASA, Rosuvastatin Hyperlipidemia, takes Rosuvastatin, LDL 70 12/25/19 Postinflammatory  pulmonary fibrosis, Hypoxia, new.  CKD Bun/creat 18/1.0 08/01/20 Dysphagia,regular diet. Constipation, takes Senokot S, prn Bisacodyl suppository GERD, takes Pantoprazole, Famotidine OA, general, takes Tylenol 650mg  qhs.  RLS, takes Requip Seizure: onset 06/26/20, negative CT, EEG, MRI, may consider Keppra if recurs, f/u Neurology   Past Medical History:  Diagnosis Date  . Anemia   . Carotid arterial disease (HCC)    asymptomatic   . Chest tightness   . Coronary artery disease   . High blood pressure   . Leg pain    with walking  . Palpitations    Past Surgical History:  Procedure Laterality Date  . CARDIAC CATHETERIZATION  07/15/04   noncritical CAD,80% PLA treated medically. EF% 60. no RAS.  14/8/21 CAROTID ENDARTERECTOMY     RIGHT=05/11/11, LEFT=03/30/11  . EYE SURGERY  2011   Cataract surgery Bilateral  . EYE SURGERY  2011   Bilateral eyelid surgery for ptosis  . renal artery duplex  02/23/13   ABDOMINAL AORTA:<50% DIAMETER REDUCTION. RIGHT RENAL ARTERY: 60-99% DIAMETER REDUCTION.2012 LEFT RENAL ARTERY:1-59% DIAMETER REDUCTION.    Allergies  Allergen Reactions  . Cardura [Doxazosin Mesylate] Other (See Comments)    Reaction not recalled by the patient  . Lotensin [Benazepril Hcl] Other (See Comments)    Reaction not recalled by the patient  . Tramadol Nausea And Vomiting    Allergies as of 08/23/2020      Reactions   Cardura [doxazosin Mesylate] Other (See Comments)   Reaction not recalled by the patient   Lotensin [benazepril Hcl] Other (See Comments)   Reaction not recalled by the patient   Tramadol Nausea And Vomiting      Medication List       Accurate as of August 23, 2020  4:37 PM.  If you have any questions, ask your nurse or doctor.        acetaminophen 325 MG tablet Commonly known as: TYLENOL Take 650 mg by mouth at bedtime.   aspirin EC 81 MG tablet Take 1  tablet (81 mg total) by mouth daily.   bisacodyl 10 MG suppository Commonly known as: DULCOLAX Place 10 mg rectally daily as needed for moderate constipation.   famotidine 20 MG tablet Commonly known as: PEPCID TAKE 1 TABLET BY MOUTH EVERYDAY AT BEDTIME What changed: See the new instructions.   furosemide 20 MG tablet Commonly known as: LASIX Take 20 mg by mouth daily.   LORazepam 0.5 MG tablet Commonly known as: ATIVAN Take 0.5 mg by mouth every 8 (eight) hours as needed for anxiety.   losartan 25 MG tablet Commonly known as: COZAAR Take 25 mg by mouth in the morning and at bedtime.   memantine 5 MG tablet Commonly known as: NAMENDA Take 5 mg by mouth 2 (two) times daily.   metoprolol tartrate 25 MG tablet Commonly known as: LOPRESSOR Take 12.5 mg by mouth 2 (two) times daily.   pantoprazole 40 MG tablet Commonly known as: PROTONIX Take 40 mg by mouth daily.   potassium chloride 10 MEQ tablet Commonly known as: KLOR-CON Take 10 mEq by mouth daily.   rOPINIRole 1 MG tablet Commonly known as: REQUIP Take 1 mg by mouth every evening.   rosuvastatin 10 MG tablet Commonly known as: CRESTOR Take 10 mg by mouth at bedtime.   Vitamin D3 25 MCG tablet Commonly known as: Vitamin D Take 1,000 Units by mouth daily.   zinc oxide 20 % ointment Apply 1 application topically as needed for irritation.       Review of Systems  Constitutional: Positive for activity change, appetite change and unexpected weight change. Negative for fever.       #3Ibs weight gained in the past 5 days.   HENT: Positive for hearing loss. Negative for congestion and trouble swallowing.   Eyes: Negative for visual disturbance.  Respiratory: Positive for shortness of breath. Negative for cough, chest tightness and wheezing.   Cardiovascular: Positive for leg swelling. Negative for chest pain and palpitations.  Gastrointestinal: Negative for abdominal pain and constipation.  Genitourinary:  Negative for dysuria, frequency and urgency.  Musculoskeletal: Positive for back pain and gait problem.       Uses walker.   Skin: Negative for color change.  Neurological: Negative for seizures, syncope, speech difficulty, weakness, light-headedness and headaches.  Psychiatric/Behavioral: Positive for agitation, confusion and sleep disturbance. Negative for behavioral problems. The patient is not nervous/anxious.     Immunization History  Administered Date(s) Administered  . Influenza Split 04/19/2014, 04/20/2015  . Influenza Whole 04/19/2016  . Influenza,inj,Quad PF,6+ Mos 04/14/2018  . Influenza-Unspecified 04/23/2020  . Moderna Sars-Covid-2 Vaccination 07/24/2019, 08/21/2019, 06/03/2020  . Pneumococcal Conjugate-13 08/23/2014  . Tdap 09/21/2017   Pertinent  Health Maintenance Due  Topic Date Due  . PNA vac Low Risk Adult (2 of 2 - PPSV23) 08/24/2015  . INFLUENZA VACCINE  Completed  . DEXA SCAN  Completed   Fall Risk  03/24/2016  Falls in the past year? No  Comment Emmi Telephone Survey: data to providers prior to load   Functional Status Survey:    Vitals:   08/23/20 1310  BP: (!) 159/66  Pulse: 80  Resp: 16  Temp: (!) 97.5 F (36.4 C)  SpO2: 95%  Weight: 110 lb 9.6 oz (50.2 kg)  Height: 5'  2" (1.575 m)   Body mass index is 20.23 kg/m. Physical Exam Vitals and nursing note reviewed.  Constitutional:      Appearance: Normal appearance.  HENT:     Head: Normocephalic and atraumatic.     Mouth/Throat:     Mouth: Mucous membranes are moist.  Eyes:     Extraocular Movements: Extraocular movements intact.     Conjunctiva/sclera: Conjunctivae normal.     Pupils: Pupils are equal, round, and reactive to light.  Cardiovascular:     Rate and Rhythm: Normal rate and regular rhythm.     Heart sounds: No murmur heard.   Pulmonary:     Effort: Pulmonary effort is normal.     Breath sounds: Rales present. No wheezing or rhonchi.     Comments: Bibasilar rales.   Abdominal:     General: Bowel sounds are normal.     Palpations: Abdomen is soft.     Tenderness: There is no abdominal tenderness.  Musculoskeletal:     Cervical back: Normal range of motion and neck supple.     Right lower leg: Edema present.     Left lower leg: Edema present.     Comments: 3+ edema RLE, 2+ edema LLE  Skin:    General: Skin is warm and dry.     Comments: Negative Homans sign BLE  Neurological:     General: No focal deficit present.     Mental Status: She is alert. Mental status is at baseline.     Motor: No weakness.     Coordination: Coordination normal.     Gait: Gait abnormal.     Comments: Oriented to person, place. Furniture, rails walking sometimes.  Psychiatric:        Mood and Affect: Mood normal.     Labs reviewed: Recent Labs    06/26/20 0830 06/27/20 0413 06/27/20 1729 08/01/20 0000  NA 134* 135  --  139  K 3.9 3.0*  --  4.4  CL 93* 95*  --  100  CO2 29 25  --  28*  GLUCOSE 130* 92 291*  --   BUN 21 13  --  18  CREATININE 0.89 0.85  --  1.0  CALCIUM 10.0 9.5  --  10.0   Recent Labs    11/09/19 1441 03/28/20 0000 06/26/20 0830 06/27/20 0413 08/01/20 0000  AST 28   < > 23 35 31  ALT 54*   < > 20 20 48*  ALKPHOS 82   < > 60 56 99  BILITOT 0.7  --  0.8 1.5*  --   PROT 6.4*  --  6.5 6.1*  --   ALBUMIN 2.8*   < > 3.9 3.5 4.0   < > = values in this interval not displayed.   Recent Labs    11/09/19 1441 03/28/20 0000 06/26/20 0830 06/27/20 0413 08/01/20 0000  WBC 10.0   < > 12.6* 10.7* 7.6  NEUTROABS 7.7  --  11.2*  --  6,080.00  HGB 11.4*   < > 14.1 14.2 14.6  HCT 36.3   < > 42.4 40.3 45  MCV 91.9  --  94.2 90.6  --   PLT 299   < > 193 155 204   < > = values in this interval not displayed.   Lab Results  Component Value Date   TSH 2.130 11/02/2019   Lab Results  Component Value Date   HGBA1C 6.3 (H) 11/02/2019   No results found for:  CHOL, HDL, LDLCALC, LDLDIRECT, TRIG, CHOLHDL  Significant Diagnostic Results in  last 30 days:  No results found.  Assessment/Plan Hypoxia persisted O2 desaturation, pending echocardiogram, BLE edema 3+ RLE, 2+LLE, started Furosemide 20mg  qd yesterday 08/22/20. CXR 08/19/20 chronic interstitial lund disease.  Will repeat CXR ap/lateral to evaluate any new cardiopulmonary process, O2 2lpm via [place] to maintain Sat O2>90%, EKG given Hx of CAD. Observe.   Edema of both lower legs New 3+ RLE, 2+ LLE, weight gained about #3Ibs in the past 5 days, negative Homans sign, will obtain venous US BLE to r/o DVT, continue Furosemide.   Insomnia New, did not sleep last night, restlessness, will have Lorazepam 0.5mg  q8hr prn x 5 days. Observe.   Mixed dementia (HCC) Dementia, takes Memantine.No behaviors.the patient needs close supervision for safety due to her decreased safety awareness.    Renovascular hypertension Renovascular HTN,Sbp in 150s, takingMetoprolol, Losartan, Bun/creat 18/1.0 08/01/20   Carotid arterial disease (HCC) Carotidarterystenosis US carotid artery: <40% internal carotid artery stenosis bilaterally, s/p endarterectomy.   CAD (coronary artery disease) CAD, PVD, takes ASA, Rosuvastatin  Hyperlipidemia Hyperlipidemia, takes Rosuvastatin, LDL 70 12/25/19  Postinflammatory pulmonary fibrosis (HCC) Postinflammatory pulmonary fibrosis, Hypoxia, new.    CKD (chronic kidney disease) stage 3, GFR 30-59 ml/min (HCC) CKD Bun/creat 18/1.0 08/01/20   Dysphagia Dysphagia,regular diet.  Slow transit constipation Constipation, takes Senokot S, prn Bisacodyl suppository   GERD (gastroesophageal reflux disease) GERD, takes Pantoprazole, Famotidine  Lower back pain OA, general, takes Tylenol 650mg  qhs.  Restless leg syndrome RLS, takes Requip   New onset seizure without head trauma [facility]) Seizure: onset 06/26/20, negative CT, EEG, MRI, may consider Keppra if recurs, f/u Neurology      Family/ staff Communication: plan of care reviewed  with the patient and charge nurse.   Labs/tests ordered:  Repeat CXR ap/latearl. Venous US BLE. EKG  Time spend 40 minutes.

## 2020-08-23 NOTE — Assessment & Plan Note (Signed)
Seizure: onset 06/26/20, negative CT, EEG, MRI, may consider Keppra if recurs, f/u Neurology  

## 2020-08-23 NOTE — Assessment & Plan Note (Signed)
OA, general, takes Tylenol 650mg qhs. 

## 2020-08-23 NOTE — Assessment & Plan Note (Signed)
Dysphagia,regular diet.

## 2020-08-23 NOTE — Assessment & Plan Note (Addendum)
Renovascular HTN,Sbp in 150s, takingMetoprolol, Losartan, Bun/creat 18/1.0 08/01/20

## 2020-08-23 NOTE — Assessment & Plan Note (Signed)
CAD, PVD, takes ASA, Rosuvastatin 

## 2020-08-23 NOTE — Assessment & Plan Note (Signed)
CKD Bun/creat 18/1.0 08/01/20

## 2020-08-23 NOTE — Assessment & Plan Note (Signed)
Carotidarterystenosis US carotid artery: <40% internal carotid artery stenosis bilaterally, s/p endarterectomy.  

## 2020-08-24 DIAGNOSIS — R0602 Shortness of breath: Secondary | ICD-10-CM | POA: Diagnosis not present

## 2020-08-26 ENCOUNTER — Non-Acute Institutional Stay: Payer: Medicare Other | Admitting: Nurse Practitioner

## 2020-08-26 ENCOUNTER — Encounter: Payer: Self-pay | Admitting: Nurse Practitioner

## 2020-08-26 DIAGNOSIS — J8410 Pulmonary fibrosis, unspecified: Secondary | ICD-10-CM | POA: Diagnosis not present

## 2020-08-26 DIAGNOSIS — R4789 Other speech disturbances: Secondary | ICD-10-CM | POA: Diagnosis not present

## 2020-08-26 DIAGNOSIS — G4700 Insomnia, unspecified: Secondary | ICD-10-CM

## 2020-08-26 DIAGNOSIS — I6523 Occlusion and stenosis of bilateral carotid arteries: Secondary | ICD-10-CM | POA: Diagnosis not present

## 2020-08-26 DIAGNOSIS — R1310 Dysphagia, unspecified: Secondary | ICD-10-CM | POA: Diagnosis not present

## 2020-08-26 DIAGNOSIS — N1832 Chronic kidney disease, stage 3b: Secondary | ICD-10-CM

## 2020-08-26 DIAGNOSIS — M545 Low back pain, unspecified: Secondary | ICD-10-CM

## 2020-08-26 DIAGNOSIS — G309 Alzheimer's disease, unspecified: Secondary | ICD-10-CM | POA: Diagnosis not present

## 2020-08-26 DIAGNOSIS — R600 Localized edema: Secondary | ICD-10-CM

## 2020-08-26 DIAGNOSIS — Z9181 History of falling: Secondary | ICD-10-CM | POA: Diagnosis not present

## 2020-08-26 DIAGNOSIS — R000 Tachycardia, unspecified: Secondary | ICD-10-CM

## 2020-08-26 DIAGNOSIS — I2510 Atherosclerotic heart disease of native coronary artery without angina pectoris: Secondary | ICD-10-CM | POA: Diagnosis not present

## 2020-08-26 DIAGNOSIS — R296 Repeated falls: Secondary | ICD-10-CM

## 2020-08-26 DIAGNOSIS — J189 Pneumonia, unspecified organism: Secondary | ICD-10-CM

## 2020-08-26 DIAGNOSIS — F0150 Vascular dementia without behavioral disturbance: Secondary | ICD-10-CM

## 2020-08-26 DIAGNOSIS — I10 Essential (primary) hypertension: Secondary | ICD-10-CM | POA: Diagnosis not present

## 2020-08-26 DIAGNOSIS — R2689 Other abnormalities of gait and mobility: Secondary | ICD-10-CM | POA: Diagnosis not present

## 2020-08-26 DIAGNOSIS — E782 Mixed hyperlipidemia: Secondary | ICD-10-CM | POA: Diagnosis not present

## 2020-08-26 DIAGNOSIS — R2681 Unsteadiness on feet: Secondary | ICD-10-CM | POA: Diagnosis not present

## 2020-08-26 DIAGNOSIS — F0280 Dementia in other diseases classified elsewhere without behavioral disturbance: Secondary | ICD-10-CM

## 2020-08-26 DIAGNOSIS — M6281 Muscle weakness (generalized): Secondary | ICD-10-CM | POA: Diagnosis not present

## 2020-08-26 DIAGNOSIS — R569 Unspecified convulsions: Secondary | ICD-10-CM

## 2020-08-26 DIAGNOSIS — G2581 Restless legs syndrome: Secondary | ICD-10-CM

## 2020-08-26 DIAGNOSIS — K219 Gastro-esophageal reflux disease without esophagitis: Secondary | ICD-10-CM

## 2020-08-26 DIAGNOSIS — K5901 Slow transit constipation: Secondary | ICD-10-CM

## 2020-08-26 NOTE — Assessment & Plan Note (Signed)
Postinflammatory pulmonary fibrosis, Hypoxia, new. Due to PNA

## 2020-08-26 NOTE — Assessment & Plan Note (Signed)
CAD, PVD, takes ASA, Rosuvastatin. EKG 08/24/20 no significant ST-T changes. SR vent rate 84 bpm  

## 2020-08-26 NOTE — Assessment & Plan Note (Signed)
Situational, prn Lorazepam for a few days.

## 2020-08-26 NOTE — Assessment & Plan Note (Signed)
:   onset 06/26/20, negative CT, EEG, MRI, may consider Keppra if recurs, f/u Neurology  

## 2020-08-26 NOTE — Assessment & Plan Note (Signed)
Renovascular HTN,takingMetoprolol, Losartan, Bun/creat22/0.95 08/22/20

## 2020-08-26 NOTE — Assessment & Plan Note (Signed)
New, Venous US r/o DVT, takes Furosemide, pending echocardiogram, CXR showed no pulmonary edema/congestion.

## 2020-08-26 NOTE — Assessment & Plan Note (Signed)
takes Rosuvastatin, LDL 70 12/25/19  

## 2020-08-26 NOTE — Assessment & Plan Note (Signed)
CKD Bun/creat22/0.95 08/22/20

## 2020-08-26 NOTE — Assessment & Plan Note (Signed)
takes Senokot S, prn Bisacodyl suppository

## 2020-08-26 NOTE — Assessment & Plan Note (Signed)
left lower lund, CXR 08/24/20 pulmonary infiltrate left lung base, 7 day course of  Augmentin 831m bid started subsequently(,CXR 08/19/20 chronic interstitial lund disease), persisted O2 desaturation, pending echocardiogram, BLE edema improving slightly, started Furosemide 248mqd 08/22/20. Venous [place] 08/23/20 negative for DVT. 08/22/20 wbc 7.7, Hgb 13.7, plt 175, neutrophils 70.4%, Na 135, K 4.3, bun 22, creat 0.95, eGFR 52.

## 2020-08-26 NOTE — Assessment & Plan Note (Signed)
Resolved, EKG 08/22/20 SR, vent rate 84bpm

## 2020-08-26 NOTE — Assessment & Plan Note (Signed)
general, takes Tylenol 650mg  qhs.

## 2020-08-26 NOTE — Assessment & Plan Note (Signed)
takes Pantoprazole, Famotidine 

## 2020-08-26 NOTE — Progress Notes (Signed)
Location:    [place] Room Number: 15 Place of Service:  ALF [phone]) Provider: Marlana Latus NP  Virgie Dad, MD  Patient Care Team: Virgie Dad, MD as PCP - General (Internal Medicine) Terance Ice, MD (Inactive) (Cardiology)  Extended Emergency Contact Information Primary Emergency Contact: Jones Bales of [place] Phone: [phone] Relation: Friend Secondary Emergency Contact: Rondall Allegra [place] of [place] Phone: [phone] Relation: Friend  Code Status:  DNR Goals of care: Advanced Directive information Advanced Directives 07/10/2020  Does Patient Have a Medical Advance Directive? Yes  Type of Paramedic of [place];Living will  Does patient want to make changes to medical advance directive? No - Patient declined  Copy of [place] in Chart? Yes - validated most recent copy scanned in chart (See row information)  Would patient like information on creating a medical advance directive? -  Pre-existing out of facility DNR order (yellow form or pink MOST form) -     Chief Complaint  Patient presents with  . Medical Management of Chronic Issues  . Health Maintenance    PPSV23    HPI:  Pt is a 87 y.o. female seen today for medical management of chronic diseases.     Pneumonia, left lower lund, CXR 08/24/20 pulmonary infiltrate left lung base, 7 day course of  Augmentin 866m bid started subsequently(,CXR 08/19/20 chronic interstitial lund disease), persisted O2 desaturation, pending echocardiogram, BLE edema improving slightly, started Furosemide 236mqd 08/22/20. Venous [place] 08/23/20 negative for DVT. 08/22/20 wbc 7.7, Hgb 13.7, plt 175, neutrophils 70.4%, Na 135, K 4.3, bun 22, creat 0.95, eGFR 52.   Recurrent falls, increased weakness, lack of safety awareness are contributory. Unwitnessed fall 08/24/20, no apparently injury.   Insomnia, prn Lorazepam.               Dementia, takes Memantine.No behaviors.the patient needs close supervision for safety due to her decreased safety awareness. Renovascular HTN,takingMetoprolol, Losartan, Bun/creat22/0.95 08/22/20 Carotidarterystenosis [place]otid artery: <40% internal carotid artery stenosis bilaterally, s/p endarterectomy.  CAD, PVD, takes ASA, Rosuvastatin. EKG 08/24/20 no significant ST-T changes. SR vent rate 84 bpm Hyperlipidemia, takes Rosuvastatin, LDL 70 12/25/19 Postinflammatory pulmonary fibrosis, Hypoxia, new. Due to PNA CKD Bun/creat22/0.95 08/22/20 Dysphagia,regular diet. Constipation, takes Senokot S, prn Bisacodyl suppository GERD, takes Pantoprazole, Famotidine OA, general, takes Tylenol 6506mhs.  RLS, takes Requip Seizure: onset 06/26/20, negative CT, EEG, MRI, may consider Keppra if recurs, f/u Neurology  Past Medical History:  Diagnosis Date  . Anemia   . Carotid arterial disease (HCC)    asymptomatic   . Chest tightness   . Coronary artery disease   . High blood pressure   . Leg pain    with walking  . Palpitations    Past Surgical History:  Procedure Laterality Date  . CARDIAC CATHETERIZATION  07/15/04   noncritical CAD,80% PLA treated medically. EF% 60. no RAS.  . CMarland KitchenROTID ENDARTERECTOMY     RIGHT=05/11/11, LEFT=03/30/11  . EYE SURGERY  2011   Cataract surgery Bilateral  . EYE SURGERY  2011   Bilateral eyelid surgery for ptosis  . renal artery duplex  02/23/13   ABDOMINAL AORTA:<50% DIAMETER REDUCTION. RIGHT RENAL ARTERY: 60-99% DIAMETER REDUCTION.. LMarland KitchenFT RENAL ARTERY:1-59% DIAMETER REDUCTION.    Allergies  Allergen Reactions  . Cardura [Doxazosin Mesylate] Other (See Comments)    Reaction not recalled by the patient  . Lotensin [Benazepril Hcl] Other (See Comments)  Reaction not recalled by the patient  .  Tramadol Nausea And Vomiting    Allergies as of 08/26/2020      Reactions   Cardura [doxazosin Mesylate] Other (See Comments)   Reaction not recalled by the patient   Lotensin [benazepril Hcl] Other (See Comments)   Reaction not recalled by the patient   Tramadol Nausea And Vomiting      Medication List       Accurate as of August 26, 2020 11:59 PM. If you have any questions, ask your nurse or doctor.        acetaminophen 325 MG tablet Commonly known as: TYLENOL Take 650 mg by mouth at bedtime.   amoxicillin-clavulanate 875-125 MG tablet Commonly known as: AUGMENTIN Take 1 tablet by mouth 2 (two) times daily.   aspirin EC 81 MG tablet Take 1 tablet (81 mg total) by mouth daily.   bisacodyl 10 MG suppository Commonly known as: DULCOLAX Place 10 mg rectally daily as needed for moderate constipation.   famotidine 20 MG tablet Commonly known as: PEPCID Take 20 mg by mouth daily. What changed: Another medication with the same name was removed. Continue taking this medication, and follow the directions you see here. Changed by: Kaelynn Igo X Denni France, NP   furosemide 20 MG tablet Commonly known as: LASIX Take 20 mg by mouth daily.   LORazepam 0.5 MG tablet Commonly known as: ATIVAN Take 0.5 mg by mouth every 8 (eight) hours as needed for anxiety.   losartan 25 MG tablet Commonly known as: COZAAR Take 25 mg by mouth in the morning and at bedtime.   memantine 5 MG tablet Commonly known as: NAMENDA Take 5 mg by mouth 2 (two) times daily.   metoprolol tartrate 25 MG tablet Commonly known as: LOPRESSOR Take 12.5 mg by mouth 2 (two) times daily.   pantoprazole 40 MG tablet Commonly known as: PROTONIX Take 40 mg by mouth daily.   potassium chloride 10 MEQ tablet Commonly known as: KLOR-CON Take 10 mEq by mouth daily.   rOPINIRole 1 MG tablet Commonly known as: REQUIP Take 1 mg by mouth every evening.   rosuvastatin 10 MG tablet Commonly known as: CRESTOR Take 10 mg by  mouth at bedtime.   saccharomyces boulardii 250 MG capsule Commonly known as: FLORASTOR Take 250 mg by mouth 2 (two) times daily.   Vitamin D3 25 MCG tablet Commonly known as: Vitamin D Take 1,000 Units by mouth daily.   zinc oxide 20 % ointment Apply 1 application topically as needed for irritation.       Review of Systems  Constitutional: Positive for activity change, appetite change and unexpected weight change. Negative for fever.       #3Ibs weight gained in the past 5 days.   HENT: Positive for hearing loss. Negative for congestion and trouble swallowing.   Eyes: Negative for visual disturbance.  Respiratory: Positive for shortness of breath. Negative for cough, chest tightness and wheezing.   Cardiovascular: Positive for leg swelling. Negative for chest pain and palpitations.  Gastrointestinal: Negative for abdominal pain and constipation.  Genitourinary: Negative for dysuria, frequency and urgency.  Musculoskeletal: Positive for back pain and gait problem.       Uses walker.   Skin: Negative for color change.  Neurological: Negative for seizures, syncope, speech difficulty, weakness, light-headedness and headaches.  Psychiatric/Behavioral: Positive for agitation, confusion and sleep disturbance. Negative for behavioral problems. The patient is not nervous/anxious.     Immunization History  Administered Date(s) Administered  .  Influenza Split 04/19/2014, 04/20/2015  . Influenza Whole 04/19/2016  . Influenza,inj,Quad PF,6+ Mos 04/14/2018  . Influenza-Unspecified 04/23/2020  . Moderna Sars-Covid-2 Vaccination 07/24/2019, 08/21/2019, 06/03/2020  . Pneumococcal Conjugate-13 08/23/2014  . Tdap 09/21/2017   Pertinent  Health Maintenance Due  Topic Date Due  . PNA vac Low Risk Adult (2 of 2 - PPSV23) 08/24/2015  . INFLUENZA VACCINE  Completed  . DEXA SCAN  Completed   Fall Risk  03/24/2016  Falls in the past year? No  Comment Emmi Telephone Survey: data to providers  prior to load   Functional Status Survey:    Vitals:   08/26/20 1209  BP: (!) 156/68  Pulse: 66  Resp: 20  Temp: (!) 97 F (36.1 C)  SpO2: 95%  Weight: 110 lb 9.6 oz (50.2 kg)  Height: 5' 2"  (1.575 m)   Body mass index is 20.23 kg/m. Physical Exam Vitals and nursing note reviewed.  Constitutional:      Appearance: Normal appearance.  HENT:     Head: Normocephalic and atraumatic.     Mouth/Throat:     Mouth: Mucous membranes are moist.  Eyes:     Extraocular Movements: Extraocular movements intact.     Conjunctiva/sclera: Conjunctivae normal.     Pupils: Pupils are equal, round, and reactive to light.  Cardiovascular:     Rate and Rhythm: Normal rate and regular rhythm.     Heart sounds: No murmur heard.   Pulmonary:     Effort: Pulmonary effort is normal.     Breath sounds: Rales present. No wheezing or rhonchi.     Comments: Bibasilar rales.  Abdominal:     General: Bowel sounds are normal.     Palpations: Abdomen is soft.     Tenderness: There is no abdominal tenderness.  Musculoskeletal:     Cervical back: Normal range of motion and neck supple.     Right lower leg: Edema present.     Left lower leg: Edema present.     Comments: 3+ edema RLE, 2+ edema LLE  Skin:    General: Skin is warm and dry.     Comments: Negative Homans sign BLE  Neurological:     General: No focal deficit present.     Mental Status: She is alert. Mental status is at baseline.     Motor: No weakness.     Coordination: Coordination normal.     Gait: Gait abnormal.     Comments: Oriented to person, place. Furniture, rails walking sometimes.  Psychiatric:        Mood and Affect: Mood normal.     Labs reviewed: Recent Labs    06/26/20 0830 06/27/20 0413 06/27/20 1729 08/01/20 0000  NA 134* 135  --  139  K 3.9 3.0*  --  4.4  CL 93* 95*  --  100  CO2 29 25  --  28*  GLUCOSE 130* 92 291*  --   BUN 21 13  --  18  CREATININE 0.89 0.85  --  1.0  CALCIUM 10.0 9.5  --  10.0    Recent Labs    11/09/19 1441 03/28/20 0000 06/26/20 0830 06/27/20 0413 08/01/20 0000  AST 28   < > 23 35 31  ALT 54*   < > 20 20 48*  ALKPHOS 82   < > 60 56 99  BILITOT 0.7  --  0.8 1.5*  --   PROT 6.4*  --  6.5 6.1*  --   ALBUMIN 2.8*   < >  3.9 3.5 4.0   < > = values in this interval not displayed.   Recent Labs    11/09/19 1441 03/28/20 0000 06/26/20 0830 06/27/20 0413 08/01/20 0000  WBC 10.0   < > 12.6* 10.7* 7.6  NEUTROABS 7.7  --  11.2*  --  6,080.00  HGB 11.4*   < > 14.1 14.2 14.6  HCT 36.3   < > 42.4 40.3 45  MCV 91.9  --  94.2 90.6  --   PLT 299   < > 193 155 204   < > = values in this interval not displayed.   Lab Results  Component Value Date   TSH 2.130 11/02/2019   Lab Results  Component Value Date   HGBA1C 6.3 (H) 11/02/2019   No results found for: CHOL, HDL, LDLCALC, LDLDIRECT, TRIG, CHOLHDL  Significant Diagnostic Results in last 30 days:  No results found.  Assessment/Plan Pneumonia of left lower lobe due to infectious organism left lower lund, CXR 08/24/20 pulmonary infiltrate left lung base, 7 day course of  Augmentin 844m bid started subsequently(,CXR 08/19/20 chronic interstitial lund disease), persisted O2 desaturation, pending echocardiogram, BLE edema improving slightly, started Furosemide 249mqd 08/22/20. Venous [place] 08/23/20 negative for DVT. 08/22/20 wbc 7.7, Hgb 13.7, plt 175, neutrophils 70.4%, Na 135, K 4.3, bun 22, creat 0.95, eGFR 52.    Frequent falls Recurrent falls, increased weakness, lack of safety awareness are contributory. Unwitnessed fall 08/24/20, no apparently injury. Close supervision, assistance for safety.   Insomnia Situational, prn Lorazepam for a few days.   Tachycardia Resolved, EKG 08/22/20 SR, vent rate 84bpm  Mixed dementia (HCC) Dementia, takes Memantine.No behaviors.the patient needs close supervision for safety due to her decreased safety awareness.   Essential hypertension vs Renovascular  hbp Renovascular HTN,takingMetoprolol, Losartan, Bun/creat22/0.95 08/22/20   Carotid stenosis [place]ot[place] artery: <40% internal carotid artery stenosis bilaterally, s/p endarterectomy.    CAD (coronary artery disease) CAD, PVD, takes ASA, Rosuvastatin. EKG 08/24/20 no significant ST-T changes. 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 Due to PNA   CKD (chronic kidney disease) stage 3, GFR 30-59 ml/min (HCC) CKD Bun/creat22/0.95 08/22/20   Dysphagia Regular diet.   Slow transit constipation takes Senokot S, prn Bisacodyl suppository   GERD (gastroesophageal reflux disease) takes Pantoprazole, Famotidine   Edema of both lower legs New, Venous [place]/o DVT, takes Furosemide, pending echocardiogram, CXR showed no pulmonary edema/congestion.   Lower back pain general, takes Tylenol 65071mhs.   Restless leg syndrome Takes Requip.   [facility], EEG, MRI, may consider Keppra if recurs, f/u Neurology    Family/ staff Communication: plan of care reviewed with the patient and charge nurse.   Labs/tests ordered: none  Time spend 40 minutes.

## 2020-08-26 NOTE — Assessment & Plan Note (Signed)
Dementia, takes Memantine.No behaviors.the patient needs close supervision for safety due to her decreased safety awareness.

## 2020-08-26 NOTE — Assessment & Plan Note (Signed)
US carotid artery: <40% internal carotid artery stenosis bilaterally, s/p endarterectomy.

## 2020-08-26 NOTE — Assessment & Plan Note (Signed)
Regular diet

## 2020-08-26 NOTE — Assessment & Plan Note (Signed)
Takes Requip 

## 2020-08-26 NOTE — Assessment & Plan Note (Signed)
Recurrent falls, increased weakness, lack of safety awareness are contributory. Unwitnessed fall 08/24/20, no apparently injury. Close supervision, assistance for safety.

## 2020-08-27 ENCOUNTER — Encounter: Payer: Self-pay | Admitting: Nurse Practitioner

## 2020-08-28 DIAGNOSIS — R2689 Other abnormalities of gait and mobility: Secondary | ICD-10-CM | POA: Diagnosis not present

## 2020-08-28 DIAGNOSIS — M6281 Muscle weakness (generalized): Secondary | ICD-10-CM | POA: Diagnosis not present

## 2020-08-28 DIAGNOSIS — Z9181 History of falling: Secondary | ICD-10-CM | POA: Diagnosis not present

## 2020-08-28 DIAGNOSIS — G309 Alzheimer's disease, unspecified: Secondary | ICD-10-CM | POA: Diagnosis not present

## 2020-08-28 DIAGNOSIS — R4789 Other speech disturbances: Secondary | ICD-10-CM | POA: Diagnosis not present

## 2020-08-28 DIAGNOSIS — R2681 Unsteadiness on feet: Secondary | ICD-10-CM | POA: Diagnosis not present

## 2020-08-29 ENCOUNTER — Institutional Professional Consult (permissible substitution): Payer: Medicare Other | Admitting: Neurology

## 2020-08-30 DIAGNOSIS — M6281 Muscle weakness (generalized): Secondary | ICD-10-CM | POA: Diagnosis not present

## 2020-08-30 DIAGNOSIS — R2681 Unsteadiness on feet: Secondary | ICD-10-CM | POA: Diagnosis not present

## 2020-08-30 DIAGNOSIS — R4789 Other speech disturbances: Secondary | ICD-10-CM | POA: Diagnosis not present

## 2020-08-30 DIAGNOSIS — Z9181 History of falling: Secondary | ICD-10-CM | POA: Diagnosis not present

## 2020-08-30 DIAGNOSIS — G309 Alzheimer's disease, unspecified: Secondary | ICD-10-CM | POA: Diagnosis not present

## 2020-08-30 DIAGNOSIS — R2689 Other abnormalities of gait and mobility: Secondary | ICD-10-CM | POA: Diagnosis not present

## 2020-09-02 DIAGNOSIS — R2689 Other abnormalities of gait and mobility: Secondary | ICD-10-CM | POA: Diagnosis not present

## 2020-09-02 DIAGNOSIS — Z9181 History of falling: Secondary | ICD-10-CM | POA: Diagnosis not present

## 2020-09-02 DIAGNOSIS — R2681 Unsteadiness on feet: Secondary | ICD-10-CM | POA: Diagnosis not present

## 2020-09-02 DIAGNOSIS — M6281 Muscle weakness (generalized): Secondary | ICD-10-CM | POA: Diagnosis not present

## 2020-09-02 DIAGNOSIS — R4789 Other speech disturbances: Secondary | ICD-10-CM | POA: Diagnosis not present

## 2020-09-02 DIAGNOSIS — G309 Alzheimer's disease, unspecified: Secondary | ICD-10-CM | POA: Diagnosis not present

## 2020-09-04 DIAGNOSIS — M6281 Muscle weakness (generalized): Secondary | ICD-10-CM | POA: Diagnosis not present

## 2020-09-04 DIAGNOSIS — R2681 Unsteadiness on feet: Secondary | ICD-10-CM | POA: Diagnosis not present

## 2020-09-04 DIAGNOSIS — R4789 Other speech disturbances: Secondary | ICD-10-CM | POA: Diagnosis not present

## 2020-09-04 DIAGNOSIS — G309 Alzheimer's disease, unspecified: Secondary | ICD-10-CM | POA: Diagnosis not present

## 2020-09-04 DIAGNOSIS — Z9181 History of falling: Secondary | ICD-10-CM | POA: Diagnosis not present

## 2020-09-04 DIAGNOSIS — R2689 Other abnormalities of gait and mobility: Secondary | ICD-10-CM | POA: Diagnosis not present

## 2020-09-06 DIAGNOSIS — R4789 Other speech disturbances: Secondary | ICD-10-CM | POA: Diagnosis not present

## 2020-09-06 DIAGNOSIS — M6281 Muscle weakness (generalized): Secondary | ICD-10-CM | POA: Diagnosis not present

## 2020-09-06 DIAGNOSIS — R2689 Other abnormalities of gait and mobility: Secondary | ICD-10-CM | POA: Diagnosis not present

## 2020-09-06 DIAGNOSIS — G309 Alzheimer's disease, unspecified: Secondary | ICD-10-CM | POA: Diagnosis not present

## 2020-09-06 DIAGNOSIS — Z9181 History of falling: Secondary | ICD-10-CM | POA: Diagnosis not present

## 2020-09-06 DIAGNOSIS — R2681 Unsteadiness on feet: Secondary | ICD-10-CM | POA: Diagnosis not present

## 2020-09-09 ENCOUNTER — Non-Acute Institutional Stay: Payer: Medicare Other | Admitting: Nurse Practitioner

## 2020-09-09 ENCOUNTER — Encounter: Payer: Self-pay | Admitting: Nurse Practitioner

## 2020-09-09 DIAGNOSIS — R0902 Hypoxemia: Secondary | ICD-10-CM | POA: Diagnosis not present

## 2020-09-09 DIAGNOSIS — R1310 Dysphagia, unspecified: Secondary | ICD-10-CM

## 2020-09-09 DIAGNOSIS — G2581 Restless legs syndrome: Secondary | ICD-10-CM

## 2020-09-09 DIAGNOSIS — K219 Gastro-esophageal reflux disease without esophagitis: Secondary | ICD-10-CM

## 2020-09-09 DIAGNOSIS — E782 Mixed hyperlipidemia: Secondary | ICD-10-CM

## 2020-09-09 DIAGNOSIS — M6281 Muscle weakness (generalized): Secondary | ICD-10-CM | POA: Diagnosis not present

## 2020-09-09 DIAGNOSIS — J189 Pneumonia, unspecified organism: Secondary | ICD-10-CM | POA: Diagnosis not present

## 2020-09-09 DIAGNOSIS — I10 Essential (primary) hypertension: Secondary | ICD-10-CM | POA: Diagnosis not present

## 2020-09-09 DIAGNOSIS — I6523 Occlusion and stenosis of bilateral carotid arteries: Secondary | ICD-10-CM | POA: Diagnosis not present

## 2020-09-09 DIAGNOSIS — R296 Repeated falls: Secondary | ICD-10-CM

## 2020-09-09 DIAGNOSIS — R609 Edema, unspecified: Secondary | ICD-10-CM

## 2020-09-09 DIAGNOSIS — M5440 Lumbago with sciatica, unspecified side: Secondary | ICD-10-CM

## 2020-09-09 DIAGNOSIS — K5901 Slow transit constipation: Secondary | ICD-10-CM

## 2020-09-09 DIAGNOSIS — R531 Weakness: Secondary | ICD-10-CM

## 2020-09-09 DIAGNOSIS — N183 Chronic kidney disease, stage 3 unspecified: Secondary | ICD-10-CM

## 2020-09-09 DIAGNOSIS — R2681 Unsteadiness on feet: Secondary | ICD-10-CM | POA: Diagnosis not present

## 2020-09-09 DIAGNOSIS — Z9181 History of falling: Secondary | ICD-10-CM | POA: Diagnosis not present

## 2020-09-09 DIAGNOSIS — G309 Alzheimer's disease, unspecified: Secondary | ICD-10-CM | POA: Diagnosis not present

## 2020-09-09 DIAGNOSIS — J8410 Pulmonary fibrosis, unspecified: Secondary | ICD-10-CM

## 2020-09-09 DIAGNOSIS — I2510 Atherosclerotic heart disease of native coronary artery without angina pectoris: Secondary | ICD-10-CM

## 2020-09-09 DIAGNOSIS — R4789 Other speech disturbances: Secondary | ICD-10-CM | POA: Diagnosis not present

## 2020-09-09 DIAGNOSIS — R569 Unspecified convulsions: Secondary | ICD-10-CM

## 2020-09-09 DIAGNOSIS — F0150 Vascular dementia without behavioral disturbance: Secondary | ICD-10-CM

## 2020-09-09 DIAGNOSIS — F0280 Dementia in other diseases classified elsewhere without behavioral disturbance: Secondary | ICD-10-CM

## 2020-09-09 DIAGNOSIS — R2689 Other abnormalities of gait and mobility: Secondary | ICD-10-CM | POA: Diagnosis not present

## 2020-09-09 NOTE — Assessment & Plan Note (Signed)
Bun/creat22/0.95 08/22/20

## 2020-09-09 NOTE — Assessment & Plan Note (Signed)
CAD, PVD, takes ASA, Rosuvastatin. EKG 08/24/20 no significant ST-T changes. SR vent rate 84 bpm  

## 2020-09-09 NOTE — Assessment & Plan Note (Signed)
general, takes Tylenol 650mg qhs.  

## 2020-09-09 NOTE — Assessment & Plan Note (Signed)
onset 06/26/20, negative CT, EEG, MRI, may consider Keppra if recurs, f/u Neurology  

## 2020-09-09 NOTE — Assessment & Plan Note (Signed)
Regular diet

## 2020-09-09 NOTE — Assessment & Plan Note (Signed)
takes Rosuvastatin, LDL 70 12/25/19

## 2020-09-09 NOTE — Assessment & Plan Note (Signed)
left lower lung, CXR 08/24/20 pulmonary infiltrate left lung base, completed 7 day course of  Augmentin ,CXR 08/19/20 chronic interstitial lund disease), persisted O2 desaturation, repeat CXR 2 views.

## 2020-09-09 NOTE — Assessment & Plan Note (Signed)
US carotid artery: <40% internal carotid artery stenosis bilaterally, s/p endarterectomy.   

## 2020-09-09 NOTE — Assessment & Plan Note (Addendum)
Since 2012, Hypoxia and needs O2 continuous since the onset of PNA 2 weeks ago, did not respond to ABT, pending echocardiogram due to swelling in legs.  09/09/20 CXR new multifocal infiltrates in the lungs, there are thickened interstitial markings bilaterally. 09/10/20 Rocephin 1mg  IM qd x 7 days started per oncall, the patient and staff desires to trial period at least 72 hours. May consider Prednisone 20mg  qd x 4 days, then 10mg  qd x 4 days, then dc.

## 2020-09-09 NOTE — Progress Notes (Signed)
Location:    [facility] Room Number: 15 Place of Service:  ALF [phone]) Provider: Marlana Latus NP  Virgie Dad, MD  Patient Care Team: Virgie Dad, MD as PCP - General (Internal Medicine) Terance Ice, MD (Inactive) (Cardiology)  Extended Emergency Contact Information Primary Emergency Contact: Jones Bales of [place] Phone: [phone] Relation: Friend Secondary Emergency Contact: Rondall Allegra States of [place] Phone: [phone] Relation: Friend  Code Status:  DNR Goals of care: Advanced Directive information Advanced Directives 07/10/2020  Does Patient Have a Medical Advance Directive? Yes  Type of Paramedic of [place];Living will  Does patient want to make changes to medical advance directive? No - Patient declined  Copy of [place] in Chart? Yes - validated most recent copy scanned in chart (See row information)  Would patient like information on creating a medical advance directive? -  Pre-existing out of facility DNR order (yellow form or pink MOST form) -     Chief Complaint  Patient presents with  . Acute Visit    Fall    HPI:  Pt is a 87 y.o. female seen today for an acute visit for fall unwitnessed fall 09/07/20, abrasion back of her left hand, no decreased ROM all limbs and hand. Generalized weakness in onset of PNA 08/26/20, persisted O2 desaturation.   Pneumonia, left lower lung, CXR 08/24/20 pulmonary infiltrate left lung base, completed 7 day course of  Augmentin ,CXR 08/19/20 chronic interstitial lund disease), persisted O2 desaturation  BLE edema improving slightly, started Furosemide 58m qd 08/22/20. Venous [place] 08/23/20 negative for DVT.             Recurrent falls, increased weakness, lack of safety awareness are contributory. Unwitnessed fall 09/07/20,  no apparently injury.              Insomnia, resolved Dementia, takes Memantine.No  behaviors.the patient needs close supervision for safety due to her decreased safety awareness. Renovascular HTN,takingMetoprolol, Losartan, Bun/creat22/0.95 08/22/20 Carotidarterystenosis UK[place]arotid artery: <40% internal carotid artery stenosis bilaterally, s/p endarterectomy.  CAD, PVD, takes ASA, Rosuvastatin. EKG 08/24/20 no significant ST-T changes. SR vent rate 84 bpm Hyperlipidemia, takes Rosuvastatin, LDL 70 12/25/19 Postinflammatory pulmonary fibrosis,Hypoxia  CKD Bun/creat22/0.95 08/22/20 Dysphagia,regular diet. Constipation, takes Senokot S, prn Bisacodyl suppository GERD, takes Pantoprazole, Famotidine OA, general, takes Tylenol 652mqhs.  RLS, takes Requip Seizure: onset 06/26/20, negative CT, EEG, MRI, may consider Keppra if recurs, f/u Neurology   Past Medical History:  Diagnosis Date  . Anemia   . Carotid arterial disease (HCC)    asymptomatic   . Chest tightness   . Coronary artery disease   . High blood pressure   . Leg pain    with walking  . Palpitations    Past Surgical History:  Procedure Laterality Date  . CARDIAC CATHETERIZATION  07/15/04   noncritical CAD,80% PLA treated medically. EF% 60. no RAS.  . Marland KitchenAROTID ENDARTERECTOMY     RIGHT=05/11/11, LEFT=03/30/11  . EYE SURGERY  2011   Cataract surgery Bilateral  . EYE SURGERY  2011   Bilateral eyelid surgery for ptosis  . renal artery duplex  02/23/13   ABDOMINAL AORTA:<50% DIAMETER REDUCTION. RIGHT RENAL ARTERY: 60-99% DIAMETER REDUCTION.. Marland KitchenEFT RENAL ARTERY:1-59% DIAMETER REDUCTION.    Allergies  Allergen Reactions  . Cardura [Doxazosin Mesylate] Other (See Comments)    Reaction not recalled by the patient  . Lotensin [Benazepril Hcl] Other (See Comments)  Reaction not recalled by the patient  . Tramadol Nausea And Vomiting    Allergies as  of 09/09/2020      Reactions   Cardura [doxazosin Mesylate] Other (See Comments)   Reaction not recalled by the patient   Lotensin [benazepril Hcl] Other (See Comments)   Reaction not recalled by the patient   Tramadol Nausea And Vomiting      Medication List       Accurate as of September 09, 2020 11:59 PM. If you have any questions, ask your nurse or doctor.        STOP taking these medications   furosemide 20 MG tablet Commonly known as: LASIX Stopped by: Lucyann Romano X Demetrio Leighty, NP   potassium chloride 10 MEQ tablet Commonly known as: KLOR-CON Stopped by: Aquinnah Devin X Ruhaan Nordahl, NP     TAKE these medications   acetaminophen 325 MG tablet Commonly known as: TYLENOL Take 650 mg by mouth at bedtime.   aspirin EC 81 MG tablet Take 1 tablet (81 mg total) by mouth daily.   bisacodyl 10 MG suppository Commonly known as: DULCOLAX Place 10 mg rectally daily as needed for moderate constipation.   famotidine 20 MG tablet Commonly known as: PEPCID Take 20 mg by mouth daily.   losartan 25 MG tablet Commonly known as: COZAAR Take 25 mg by mouth in the morning and at bedtime.   memantine 5 MG tablet Commonly known as: NAMENDA Take 5 mg by mouth 2 (two) times daily.   metoprolol tartrate 25 MG tablet Commonly known as: LOPRESSOR Take 12.5 mg by mouth 2 (two) times daily.   pantoprazole 40 MG tablet Commonly known as: PROTONIX Take 40 mg by mouth daily.   rOPINIRole 1 MG tablet Commonly known as: REQUIP Take 1 mg by mouth every evening.   rosuvastatin 10 MG tablet Commonly known as: CRESTOR Take 10 mg by mouth at bedtime.   Vitamin D3 25 MCG tablet Commonly known as: Vitamin D Take 1,000 Units by mouth daily.   zinc oxide 20 % ointment Apply 1 application topically as needed for irritation.       Review of Systems  Constitutional: Positive for activity change and appetite change. Negative for fever.       Generalized weakness, needs assistance and supervision for transfer,  ambulation with walker.   HENT: Positive for hearing loss. Negative for congestion and trouble swallowing.   Eyes: Negative for visual disturbance.  Respiratory: Positive for shortness of breath. Negative for cough, chest tightness and wheezing.   Cardiovascular: Positive for leg swelling. Negative for chest pain and palpitations.  Gastrointestinal: Negative for abdominal pain and constipation.  Genitourinary: Negative for dysuria, frequency and urgency.  Musculoskeletal: Positive for back pain and gait problem.       Uses walker.   Skin: Negative for color change.       Back of the left hand abrasion.   Neurological: Negative for seizures, speech difficulty, weakness and light-headedness.  Psychiatric/Behavioral: Positive for agitation, confusion and sleep disturbance. Negative for behavioral problems. The patient is not nervous/anxious.     Immunization History  Administered Date(s) Administered  . Influenza Split 04/19/2014, 04/20/2015  . Influenza Whole 04/19/2016  . Influenza, High Dose Seasonal PF 03/18/2015, 04/27/2016, 04/28/2017  . Influenza, Quadrivalent, Recombinant, Inj, Pf 04/14/2018, 05/03/2019  . Influenza,inj,Quad PF,6+ Mos 04/14/2018  . Influenza-Unspecified 04/23/2020  . Moderna Sars-Covid-2 Vaccination 07/24/2019, 08/21/2019, 06/03/2020  . Pneumococcal Conjugate-13 08/23/2014  . Pneumococcal Polysaccharide-23 06/02/2002, 06/03/2011  . Tdap 09/21/2017  .  Zoster 06/02/2001   Pertinent  Health Maintenance Due  Topic Date Due  . INFLUENZA VACCINE  Completed  . DEXA SCAN  Completed  . PNA vac Low Risk Adult  Completed   Fall Risk  03/24/2016  Falls in the past year? No  Comment Emmi Telephone Survey: data to providers prior to load   Functional Status Survey:    Vitals:   09/09/20 1111  BP: 125/88  Pulse: 63  Temp: (!) 97 F (36.1 C)  SpO2: 92%  Weight: 110 lb 9.6 oz (50.2 kg)  Height: 5' 2"  (1.575 m)   Body mass index is 20.23 kg/m. Physical  Exam Vitals and nursing note reviewed.  Constitutional:      Appearance: Normal appearance.  HENT:     Head: Normocephalic and atraumatic.     Mouth/Throat:     Mouth: Mucous membranes are moist.  Eyes:     Extraocular Movements: Extraocular movements intact.     Conjunctiva/sclera: Conjunctivae normal.     Pupils: Pupils are equal, round, and reactive to light.  Cardiovascular:     Rate and Rhythm: Normal rate and regular rhythm.     Heart sounds: No murmur heard.   Pulmonary:     Effort: Pulmonary effort is normal.     Breath sounds: Rales present. No wheezing or rhonchi.     Comments: Bibasilar rales.  Abdominal:     General: Bowel sounds are normal.     Palpations: Abdomen is soft.     Tenderness: There is no abdominal tenderness.  Musculoskeletal:     Cervical back: Normal range of motion and neck supple.     Right lower leg: Edema present.     Left lower leg: Edema present.     Comments: 1+ edema BLE  Skin:    General: Skin is warm and dry.     Comments: Abrasion dorsum left hand MCP 2-5  Neurological:     General: No focal deficit present.     Mental Status: She is alert. Mental status is at baseline.     Motor: No weakness.     Coordination: Coordination normal.     Gait: Gait abnormal.     Comments: Oriented to person, place.  Psychiatric:        Mood and Affect: Mood normal.     Labs reviewed: Recent Labs    06/26/20 0830 06/27/20 0413 06/27/20 1729 08/01/20 0000 08/22/20 0000  NA 134* 135  --  139 135*  K 3.9 3.0*  --  4.4 4.3  CL 93* 95*  --  100 100  CO2 29 25  --  28* 25*  GLUCOSE 130* 92 291*  --   --   BUN 21 13  --  18 22*  CREATININE 0.89 0.85  --  1.0 1.0  CALCIUM 10.0 9.5  --  10.0 9.1   Recent Labs    11/09/19 1441 03/28/20 0000 06/26/20 0830 06/27/20 0413 08/01/20 0000 08/22/20 0000  AST 28   < > 23 35 31 15  ALT 54*   < > 20 20 48* 24  ALKPHOS 82   < > 60 56 99 101  BILITOT 0.7  --  0.8 1.5*  --   --   PROT 6.4*  --  6.5  6.1*  --   --   ALBUMIN 2.8*   < > 3.9 3.5 4.0 3.2*   < > = values in this interval not displayed.   Recent Labs  11/09/19 1441 03/28/20 0000 06/26/20 0830 06/27/20 0413 08/01/20 0000 08/22/20 0000  WBC 10.0   < > 12.6* 10.7* 7.6 7.7  NEUTROABS 7.7  --  11.2*  --  6,080.00 5,421.00  HGB 11.4*   < > 14.1 14.2 14.6 13.7  HCT 36.3   < > 42.4 40.3 45 42  MCV 91.9  --  94.2 90.6  --   --   PLT 299   < > 193 155 204 175   < > = values in this interval not displayed.   Lab Results  Component Value Date   TSH 2.130 11/02/2019   Lab Results  Component Value Date   HGBA1C 6.3 (H) 11/02/2019   No results found for: CHOL, HDL, LDLCALC, LDLDIRECT, TRIG, CHOLHDL  Significant Diagnostic Results in last 30 days:  No results found.  Assessment/Plan Generalized weakness The patient needs assistance with ADL, supervision for transfer and mobility, frequent falls, SNF [facility] for higher level of care needed. Will update CBC/diff, CMP/eGFR  Frequent falls falls, increased weakness, lack of safety awareness are contributory. Unwitnessed fall 09/07/20,  no apparently injury.  Pneumonia of left lower lobe due to infectious organism  left lower lung, CXR 08/24/20 pulmonary infiltrate left lung base, completed 7 day course of  Augmentin ,CXR 08/19/20 chronic interstitial lund disease), persisted O2 desaturation, repeat CXR 2 views.    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  09/09/20 CXR new multifocal infiltrates in the lungs, there are thickened interstitial markings bilaterally. 09/10/20 Rocephin 48m IM qd x 7 days started per oncall, the patient and staff desires to trial period at least 72 hours. May consider Prednisone 222mqd x 4 days, then 1062md x 4 days, then dc.   Peripheral edema BLE edema improving slightly,  started Furosemide 62m65m 08/22/20. Venous [place] 08/23/20 negative for DVT.   Mixed dementia (HCC)[place] Memantine.No behaviors.the patient needs close supervision for safety due to her decreased safety awareness.   Essential hypertension vs Renovascular hbp takingMetoprolol, Losartan, Bun/creat22/0.95 08/22/20   Carotid arterial disease (HCC)[place] c[place]id artery: <40% internal carotid artery stenosis bilaterally, s/p endarterectomy.    CAD (coronary artery disease) CAD, PVD, takes ASA, Rosuvastatin. EKG 08/24/20 no significant ST-T changes. SR vent rate 84 bpm   Hyperlipidemia  takes Rosuvastatin, LDL 70 12/25/19   CKD (chronic kidney disease) stage 3, GFR 30-59 ml/min (HCC) Bun/creat22/0.95 08/22/20  Dysphagia Regular diet   Slow transit constipation takes Senokot S, prn Bisacodyl suppository   GERD (gastroesophageal reflux disease) takes Pantoprazole, Famotidine  Lower back pain general, takes Tylenol 650mg59m.  Restless leg syndrome Stable, continue Requip  New onset seizure without head trauma (HCC) [place] 06/26/20, negative CT, EEG, MRI, may consider Keppra if recurs, f/u Neurology     Family/ staff Communication: plan of care reviewed with the patient and charge nurse.   Labs/tests ordered:  CBC/doiff, CMP/eGFR, CXR 2 views.   Time spend 40 minutes.

## 2020-09-09 NOTE — Assessment & Plan Note (Signed)
Persisted.  

## 2020-09-09 NOTE — Assessment & Plan Note (Signed)
takes Memantine.No behaviors.the patient needs close supervision for safety due to her decreased safety awareness.  

## 2020-09-09 NOTE — Assessment & Plan Note (Signed)
falls, increased weakness, lack of safety awareness are contributory. Unwitnessed fall 09/07/20,  no apparently injury.

## 2020-09-09 NOTE — Assessment & Plan Note (Signed)
takingMetoprolol, Losartan, Bun/creat22/0.95 08/22/20

## 2020-09-09 NOTE — Assessment & Plan Note (Signed)
Stable, continue Requip

## 2020-09-09 NOTE — Assessment & Plan Note (Addendum)
The patient needs assistance with ADL, supervision for transfer and mobility, frequent falls, SNF [facility] for higher level of care needed. Will update CBC/diff, CMP/eGFR

## 2020-09-09 NOTE — Assessment & Plan Note (Signed)
takes Pantoprazole, Famotidine

## 2020-09-09 NOTE — Assessment & Plan Note (Signed)
BLE edema improving slightly, started Furosemide 20mg  qd 08/22/20. Venous 10/20/20 BLE 08/23/20 negative for DVT.

## 2020-09-09 NOTE — Assessment & Plan Note (Signed)
takes Senokot S, prn Bisacodyl suppository  

## 2020-09-10 ENCOUNTER — Encounter: Payer: Self-pay | Admitting: Nurse Practitioner

## 2020-09-10 DIAGNOSIS — M6281 Muscle weakness (generalized): Secondary | ICD-10-CM | POA: Diagnosis not present

## 2020-09-10 DIAGNOSIS — G309 Alzheimer's disease, unspecified: Secondary | ICD-10-CM | POA: Diagnosis not present

## 2020-09-10 DIAGNOSIS — R2689 Other abnormalities of gait and mobility: Secondary | ICD-10-CM | POA: Diagnosis not present

## 2020-09-10 DIAGNOSIS — Z9181 History of falling: Secondary | ICD-10-CM | POA: Diagnosis not present

## 2020-09-10 DIAGNOSIS — R4789 Other speech disturbances: Secondary | ICD-10-CM | POA: Diagnosis not present

## 2020-09-10 DIAGNOSIS — R2681 Unsteadiness on feet: Secondary | ICD-10-CM | POA: Diagnosis not present

## 2020-09-11 DIAGNOSIS — G309 Alzheimer's disease, unspecified: Secondary | ICD-10-CM | POA: Diagnosis not present

## 2020-09-11 DIAGNOSIS — R2689 Other abnormalities of gait and mobility: Secondary | ICD-10-CM | POA: Diagnosis not present

## 2020-09-11 DIAGNOSIS — M6281 Muscle weakness (generalized): Secondary | ICD-10-CM | POA: Diagnosis not present

## 2020-09-11 DIAGNOSIS — Z9181 History of falling: Secondary | ICD-10-CM | POA: Diagnosis not present

## 2020-09-11 DIAGNOSIS — R2681 Unsteadiness on feet: Secondary | ICD-10-CM | POA: Diagnosis not present

## 2020-09-11 DIAGNOSIS — R4789 Other speech disturbances: Secondary | ICD-10-CM | POA: Diagnosis not present

## 2020-09-12 ENCOUNTER — Encounter (HOSPITAL_COMMUNITY): Payer: Self-pay

## 2020-09-12 ENCOUNTER — Emergency Department (HOSPITAL_COMMUNITY): Payer: Medicare Other

## 2020-09-12 ENCOUNTER — Inpatient Hospital Stay (HOSPITAL_COMMUNITY)
Admission: EM | Admit: 2020-09-12 | Discharge: 2020-09-16 | DRG: 291 | Disposition: A | Discharge status: Skilled Nursing Facility | Payer: Medicare Other | Attending: Internal Medicine | Admitting: Internal Medicine

## 2020-09-12 ENCOUNTER — Other Ambulatory Visit: Payer: Self-pay

## 2020-09-12 DIAGNOSIS — F0390 Unspecified dementia without behavioral disturbance: Secondary | ICD-10-CM | POA: Diagnosis present

## 2020-09-12 DIAGNOSIS — I2510 Atherosclerotic heart disease of native coronary artery without angina pectoris: Secondary | ICD-10-CM | POA: Diagnosis present

## 2020-09-12 DIAGNOSIS — G2581 Restless legs syndrome: Secondary | ICD-10-CM | POA: Diagnosis present

## 2020-09-12 DIAGNOSIS — I440 Atrioventricular block, first degree: Secondary | ICD-10-CM | POA: Diagnosis present

## 2020-09-12 DIAGNOSIS — J9601 Acute respiratory failure with hypoxia: Secondary | ICD-10-CM

## 2020-09-12 DIAGNOSIS — Z888 Allergy status to other drugs, medicaments and biological substances status: Secondary | ICD-10-CM | POA: Diagnosis not present

## 2020-09-12 DIAGNOSIS — I6529 Occlusion and stenosis of unspecified carotid artery: Secondary | ICD-10-CM | POA: Diagnosis not present

## 2020-09-12 DIAGNOSIS — I10 Essential (primary) hypertension: Secondary | ICD-10-CM | POA: Diagnosis not present

## 2020-09-12 DIAGNOSIS — I517 Cardiomegaly: Secondary | ICD-10-CM | POA: Diagnosis not present

## 2020-09-12 DIAGNOSIS — Z79899 Other long term (current) drug therapy: Secondary | ICD-10-CM | POA: Diagnosis not present

## 2020-09-12 DIAGNOSIS — G309 Alzheimer's disease, unspecified: Secondary | ICD-10-CM

## 2020-09-12 DIAGNOSIS — E785 Hyperlipidemia, unspecified: Secondary | ICD-10-CM | POA: Diagnosis present

## 2020-09-12 DIAGNOSIS — D72829 Elevated white blood cell count, unspecified: Secondary | ICD-10-CM | POA: Diagnosis present

## 2020-09-12 DIAGNOSIS — M810 Age-related osteoporosis without current pathological fracture: Secondary | ICD-10-CM | POA: Diagnosis not present

## 2020-09-12 DIAGNOSIS — G9341 Metabolic encephalopathy: Secondary | ICD-10-CM | POA: Diagnosis present

## 2020-09-12 DIAGNOSIS — F0150 Vascular dementia without behavioral disturbance: Secondary | ICD-10-CM

## 2020-09-12 DIAGNOSIS — Z8249 Family history of ischemic heart disease and other diseases of the circulatory system: Secondary | ICD-10-CM

## 2020-09-12 DIAGNOSIS — N183 Chronic kidney disease, stage 3 unspecified: Secondary | ICD-10-CM | POA: Diagnosis not present

## 2020-09-12 DIAGNOSIS — Z20822 Contact with and (suspected) exposure to covid-19: Secondary | ICD-10-CM | POA: Diagnosis present

## 2020-09-12 DIAGNOSIS — I272 Pulmonary hypertension, unspecified: Secondary | ICD-10-CM | POA: Diagnosis present

## 2020-09-12 DIAGNOSIS — Z66 Do not resuscitate: Secondary | ICD-10-CM | POA: Diagnosis present

## 2020-09-12 DIAGNOSIS — J90 Pleural effusion, not elsewhere classified: Secondary | ICD-10-CM | POA: Diagnosis not present

## 2020-09-12 DIAGNOSIS — E876 Hypokalemia: Secondary | ICD-10-CM | POA: Diagnosis not present

## 2020-09-12 DIAGNOSIS — I50810 Right heart failure, unspecified: Secondary | ICD-10-CM | POA: Diagnosis not present

## 2020-09-12 DIAGNOSIS — I150 Renovascular hypertension: Secondary | ICD-10-CM | POA: Diagnosis not present

## 2020-09-12 DIAGNOSIS — I25119 Atherosclerotic heart disease of native coronary artery with unspecified angina pectoris: Secondary | ICD-10-CM | POA: Diagnosis not present

## 2020-09-12 DIAGNOSIS — I130 Hypertensive heart and chronic kidney disease with heart failure and stage 1 through stage 4 chronic kidney disease, or unspecified chronic kidney disease: Secondary | ICD-10-CM | POA: Diagnosis present

## 2020-09-12 DIAGNOSIS — F05 Delirium due to known physiological condition: Secondary | ICD-10-CM | POA: Diagnosis present

## 2020-09-12 DIAGNOSIS — Z7982 Long term (current) use of aspirin: Secondary | ICD-10-CM

## 2020-09-12 DIAGNOSIS — F0280 Dementia in other diseases classified elsewhere without behavioral disturbance: Secondary | ICD-10-CM | POA: Diagnosis not present

## 2020-09-12 DIAGNOSIS — I110 Hypertensive heart disease with heart failure: Secondary | ICD-10-CM | POA: Diagnosis not present

## 2020-09-12 DIAGNOSIS — M2550 Pain in unspecified joint: Secondary | ICD-10-CM | POA: Diagnosis not present

## 2020-09-12 DIAGNOSIS — I455 Other specified heart block: Secondary | ICD-10-CM | POA: Diagnosis present

## 2020-09-12 DIAGNOSIS — E559 Vitamin D deficiency, unspecified: Secondary | ICD-10-CM | POA: Diagnosis not present

## 2020-09-12 DIAGNOSIS — R0602 Shortness of breath: Secondary | ICD-10-CM | POA: Diagnosis not present

## 2020-09-12 DIAGNOSIS — Z7401 Bed confinement status: Secondary | ICD-10-CM | POA: Diagnosis not present

## 2020-09-12 DIAGNOSIS — Z8701 Personal history of pneumonia (recurrent): Secondary | ICD-10-CM

## 2020-09-12 DIAGNOSIS — K5901 Slow transit constipation: Secondary | ICD-10-CM | POA: Diagnosis not present

## 2020-09-12 DIAGNOSIS — R778 Other specified abnormalities of plasma proteins: Secondary | ICD-10-CM

## 2020-09-12 DIAGNOSIS — E871 Hypo-osmolality and hyponatremia: Secondary | ICD-10-CM | POA: Diagnosis present

## 2020-09-12 DIAGNOSIS — J8410 Pulmonary fibrosis, unspecified: Secondary | ICD-10-CM | POA: Diagnosis not present

## 2020-09-12 DIAGNOSIS — K219 Gastro-esophageal reflux disease without esophagitis: Secondary | ICD-10-CM | POA: Diagnosis not present

## 2020-09-12 DIAGNOSIS — J9690 Respiratory failure, unspecified, unspecified whether with hypoxia or hypercapnia: Secondary | ICD-10-CM | POA: Diagnosis not present

## 2020-09-12 DIAGNOSIS — J9811 Atelectasis: Secondary | ICD-10-CM | POA: Diagnosis not present

## 2020-09-12 DIAGNOSIS — I5033 Acute on chronic diastolic (congestive) heart failure: Secondary | ICD-10-CM

## 2020-09-12 DIAGNOSIS — I509 Heart failure, unspecified: Secondary | ICD-10-CM

## 2020-09-12 DIAGNOSIS — R0902 Hypoxemia: Secondary | ICD-10-CM | POA: Diagnosis not present

## 2020-09-12 DIAGNOSIS — J9611 Chronic respiratory failure with hypoxia: Secondary | ICD-10-CM | POA: Diagnosis not present

## 2020-09-12 DIAGNOSIS — N1831 Chronic kidney disease, stage 3a: Secondary | ICD-10-CM | POA: Diagnosis present

## 2020-09-12 DIAGNOSIS — J9600 Acute respiratory failure, unspecified whether with hypoxia or hypercapnia: Secondary | ICD-10-CM | POA: Diagnosis not present

## 2020-09-12 LAB — COMPREHENSIVE METABOLIC PANEL
ALT: 42 U/L (ref 0–44)
AST: 48 U/L — ABNORMAL HIGH (ref 15–41)
Albumin: 3.2 g/dL — ABNORMAL LOW (ref 3.5–5.0)
Alkaline Phosphatase: 117 U/L (ref 38–126)
Anion gap: 10 (ref 5–15)
BUN: 25 mg/dL — ABNORMAL HIGH (ref 8–23)
CO2: 28 mmol/L (ref 22–32)
Calcium: 9.2 mg/dL (ref 8.9–10.3)
Chloride: 90 mmol/L — ABNORMAL LOW (ref 98–111)
Creatinine, Ser: 1.03 mg/dL — ABNORMAL HIGH (ref 0.44–1.00)
GFR, Estimated: 51 mL/min — ABNORMAL LOW (ref >60)
Glucose, Bld: 107 mg/dL — ABNORMAL HIGH (ref 70–99)
Potassium: 4.6 mmol/L (ref 3.5–5.1)
Sodium: 128 mmol/L — ABNORMAL LOW (ref 135–145)
Total Bilirubin: 0.8 mg/dL (ref 0.3–1.2)
Total Protein: 5.9 g/dL — ABNORMAL LOW (ref 6.5–8.1)

## 2020-09-12 LAB — CBC WITH DIFFERENTIAL/PLATELET
Abs Immature Granulocytes: 0.04 10*3/uL (ref 0.00–0.07)
Basophils Absolute: 0 10*3/uL (ref 0.0–0.1)
Basophils Relative: 0 %
Eosinophils Absolute: 0 10*3/uL (ref 0.0–0.5)
Eosinophils Relative: 0 %
HCT: 44.5 % (ref 36.0–46.0)
Hemoglobin: 14.2 g/dL (ref 12.0–15.0)
Immature Granulocytes: 0 %
Lymphocytes Relative: 9 %
Lymphs Abs: 1.1 10*3/uL (ref 0.7–4.0)
MCH: 29.8 pg (ref 26.0–34.0)
MCHC: 31.9 g/dL (ref 30.0–36.0)
MCV: 93.3 fL (ref 80.0–100.0)
Monocytes Absolute: 0.8 10*3/uL (ref 0.1–1.0)
Monocytes Relative: 7 %
Neutro Abs: 10.4 10*3/uL — ABNORMAL HIGH (ref 1.7–7.7)
Neutrophils Relative %: 84 %
Platelets: 295 10*3/uL (ref 150–400)
RBC: 4.77 MIL/uL (ref 3.87–5.11)
RDW: 14.3 % (ref 11.5–15.5)
WBC: 12.4 10*3/uL — ABNORMAL HIGH (ref 4.0–10.5)
nRBC: 0 % (ref 0.0–0.2)

## 2020-09-12 LAB — BRAIN NATRIURETIC PEPTIDE: B Natriuretic Peptide: 1723.6 pg/mL — ABNORMAL HIGH (ref 0.0–100.0)

## 2020-09-12 LAB — TROPONIN I (HIGH SENSITIVITY): Troponin I (High Sensitivity): 41 ng/L — ABNORMAL HIGH (ref <18)

## 2020-09-12 MED ORDER — FUROSEMIDE 10 MG/ML IJ SOLN
40.0000 mg | Freq: Once | INTRAMUSCULAR | Status: AC
  Administered 2020-09-12: 40 mg via INTRAVENOUS
  Filled 2020-09-12: qty 4

## 2020-09-12 MED ORDER — FUROSEMIDE 10 MG/ML IJ SOLN
40.0000 mg | Freq: Two times a day (BID) | INTRAMUSCULAR | Status: DC
  Administered 2020-09-14: 40 mg via INTRAVENOUS
  Filled 2020-09-12: qty 4

## 2020-09-12 MED ORDER — ACETAMINOPHEN 325 MG PO TABS
650.0000 mg | ORAL_TABLET | ORAL | Status: DC | PRN
  Administered 2020-09-13: 650 mg via ORAL
  Filled 2020-09-12: qty 2

## 2020-09-12 MED ORDER — ONDANSETRON HCL 4 MG/2ML IJ SOLN: 4.0000 mg | Freq: Four times a day (QID) | INTRAMUSCULAR | Status: DC | PRN

## 2020-09-12 MED ORDER — ENOXAPARIN SODIUM 30 MG/0.3ML ~~LOC~~ SOLN
30.0000 mg | SUBCUTANEOUS | Status: DC
  Administered 2020-09-12 – 2020-09-16 (×5): 30 mg via SUBCUTANEOUS
  Filled 2020-09-12 (×5): qty 0.3

## 2020-09-12 MED ORDER — SODIUM CHLORIDE 0.9 % IV SOLN: 250.0000 mL | INTRAVENOUS | Status: DC | PRN

## 2020-09-12 NOTE — H&P (Signed)
History and Physical    [name] DVV:[id number] DOB: November 10, 1926 DOA: 09/12/2020  PCP: Mahlon Gammon, MD   Patient coming from: ALF   Chief Complaint: SOB   HPI: [name] is a 87 y.o. female with medical history significant for dementia, coronary artery disease, hypertension, chronic diastolic CHF, and recent treatment for pneumonia, now presenting to the emergency department for evaluation of worsening shortness of breath and increased supplemental oxygen requirement.  Patient was treated with Augmentin for pneumonia 3 weeks ago, was requiring 2 L/min of supplemental oxygen, continued to have shortness of breath and was also treated with 20 mg Lasix daily for the past 3 weeks in addition to IM Rocephin.  She denies coughing much, has not noticed any fevers or chills, denies chest pain, but reports progressive shortness of breath and worsening bilateral lower extremity edema.  She describes episodes of acute dyspnea but is unable to identify any precipitating factors.  She denies abdominal pain, vomiting, or diarrhea, but reports sensation of swelling within her abdomen.  ED Course: Upon arrival to the ED, patient is found to be afebrile, saturating low 90s on 3 L/min of supplemental oxygen, mildly tachypneic, and with blood pressure 150/90.  EKG features sinus rhythm with first-degree AV nodal block.  Chest x-ray demonstrates new bilateral peripheral lower lung airspace opacities.  Chemistry panel notable for sodium 128 and creatinine 1.03.  CBC with leukocytosis to 12,400.  High-sensitivity troponin is 41 and BNP 1724.  COVID-19 PCR is pending.  Patient was given 40 mg IV Lasix in the ED.  Review of Systems:  All other systems reviewed and apart from HPI, are negative.  Past Medical History:  Diagnosis Date  . Anemia   . Carotid arterial disease (HCC)    asymptomatic   . Chest tightness   . Coronary artery disease   . High blood pressure   . Leg pain    with walking   . Palpitations     Past Surgical History:  Procedure Laterality Date  . CARDIAC CATHETERIZATION  07/15/04   noncritical CAD,80% PLA treated medically. EF% 60. no RAS.  [name] CAROTID ENDARTERECTOMY     RIGHT=05/11/11, LEFT=03/30/11  . EYE SURGERY  2011   Cataract surgery Bilateral  . EYE SURGERY  2011   Bilateral eyelid surgery for ptosis  . renal artery duplex  02/23/13   ABDOMINAL AORTA:<50% DIAMETER REDUCTION. RIGHT RENAL ARTERY: 60-99% DIAMETER REDUCTION.[name] LEFT RENAL ARTERY:1-59% DIAMETER REDUCTION.    Social History:   reports that she has never smoked. She has never used smokeless tobacco. She reports that she does not drink alcohol and does not use drugs.  Allergies  Allergen Reactions  . Cardura [Doxazosin Mesylate] Other (See Comments)    Reaction not recalled by the patient  . Lotensin [Benazepril Hcl] Other (See Comments)    Reaction not recalled by the patient  . Tramadol Nausea And Vomiting    Family History  Problem Relation Age of Onset  . Lung disease Mother   . Heart disease Father   . Cancer Sister        liver cancer  . Heart disease Brother      Prior to Admission medications   Medication Sig Start Date End Date Taking? Authorizing Provider  acetaminophen (TYLENOL) 325 MG tablet Take 650 mg by mouth at bedtime.    [provider]  aspirin EC 81 MG tablet Take 1 tablet (81 mg total) by mouth daily. 04/18/15   [place], [place],  MD  bisacodyl (DULCOLAX) 10 MG suppository Place 10 mg rectally daily as needed for moderate constipation.    [provider]  famotidine (PEPCID) 20 MG tablet Take 20 mg by mouth daily.    [provider]  losartan (COZAAR) 25 MG tablet Take 25 mg by mouth in the morning and at bedtime.    [provider]  memantine (NAMENDA) 5 MG tablet Take 5 mg by mouth 2 (two) times daily.    [provider]  metoprolol tartrate (LOPRESSOR) 25 MG tablet Take 12.5 mg by mouth 2 (two) times daily.     [provider]  pantoprazole (PROTONIX) 40 MG tablet Take 40 mg by mouth daily.    [provider]  rOPINIRole (REQUIP) 1 MG tablet Take 1 mg by mouth every evening.    [provider]  rosuvastatin (CRESTOR) 10 MG tablet Take 10 mg by mouth at bedtime.     [provider]  Vitamin D3 (VITAMIN D) 25 MCG tablet Take 1,000 Units by mouth daily.    [provider]  zinc oxide 20 % ointment Apply 1 application topically as needed for irritation.    [provider]    Physical Exam: Vitals:   09/12/20 1712 09/12/20 1825 09/12/20 1930 09/12/20 1936  BP: (!) 185/84 (!) 167/115  (!) 147/92  Pulse: 65 77 69 74  Resp: 19 (!) 21 (!) 21 (!) 26  Temp: 98.6 F (37 C)     TempSrc: Oral     SpO2: 98% 96% 93% 100%    Constitutional: NAD, calm  Eyes: PERTLA, lids and conjunctivae normal ENMT: Mucous membranes are moist. Posterior pharynx clear of any exudate or lesions.   Neck: normal, supple, no masses, no thyromegaly Respiratory: Mild tachypnea, no wheezing. No pallor or cyanosis.     Cardiovascular: S1 & S2 heard, regular rate and rhythm. Pretibial pitting edema bilaterally.  Abdomen: No distension, no tenderness, soft. Bowel sounds active.  Musculoskeletal: no clubbing / cyanosis. No joint deformity upper and lower extremities.   Skin: no significant rashes, lesions, ulcers. Warm, dry, well-perfused. Neurologic: CN 2-12 grossly intact. Sensation intact. Moving all extremities.  Psychiatric: Alert and oriented to person, place, and situation but not month or year. Pleasant and cooperative.    Labs and Imaging on Admission: I have personally reviewed following labs and imaging studies  CBC: Recent Labs  Lab 09/12/20 1732  WBC 12.4*  NEUTROABS 10.4*  HGB 14.2  HCT 44.5  MCV 93.3  PLT 295   Basic Metabolic Panel: Recent Labs  Lab 09/12/20 1911  NA 128*  K 4.6  CL 90*  CO2 28  GLUCOSE 107*  BUN 25*  CREATININE 1.03*  CALCIUM  9.2   GFR: Estimated Creatinine Clearance: 27.6 mL/min (A) (by C-G formula based on SCr of 1.03 mg/dL (H)). Liver Function Tests: Recent Labs  Lab 09/12/20 1911  AST 48*  ALT 42  ALKPHOS 117  BILITOT 0.8  PROT 5.9*  ALBUMIN 3.2*   No results for input(s): LIPASE, AMYLASE in the last 168 hours. No results for input(s): AMMONIA in the last 168 hours. Coagulation Profile: No results for input(s): INR, PROTIME in the last 168 hours. Cardiac Enzymes: No results for input(s): CKTOTAL, CKMB, CKMBINDEX, TROPONINI in the last 168 hours. BNP (last 3 results) No results for input(s): PROBNP in the last 8760 hours. HbA1C: No results for input(s): HGBA1C in the last 72 hours. CBG: No results for input(s): GLUCAP in the last 168  hours. Lipid Profile: No results for input(s): CHOL, HDL, LDLCALC, TRIG, CHOLHDL, LDLDIRECT in the last 72 hours. Thyroid Function Tests: No results for input(s): TSH, T4TOTAL, FREET4, T3FREE, THYROIDAB in the last 72 hours. Anemia Panel: No results for input(s): VITAMINB12, FOLATE, FERRITIN, TIBC, IRON, RETICCTPCT in the last 72 hours. Urine analysis:    Component Value Date/Time   COLORURINE YELLOW 06/26/2020 1519   APPEARANCEUR CLEAR 06/26/2020 1519   LABSPEC 1.012 06/26/2020 1519   PHURINE 6.0 06/26/2020 1519   GLUCOSEU NEGATIVE 06/26/2020 1519   HGBUR SMALL (A) 06/26/2020 1519   BILIRUBINUR NEGATIVE 06/26/2020 1519   KETONESUR 5 (A) 06/26/2020 1519   PROTEINUR NEGATIVE 06/26/2020 1519   UROBILINOGEN 0.2 09/24/2014 1351   NITRITE NEGATIVE 06/26/2020 1519   LEUKOCYTESUR SMALL (A) 06/26/2020 1519   Sepsis Labs: @LABRCNTIP (procalcitonin:4,lacticidven:4) )No results found for this or any previous visit (from the past 240 hour(s)).   Radiological Exams on Admission: DG Chest Port 1 View  Result Date: 09/12/2020 CLINICAL DATA:  Worsening shortness of breath.  Hypoxia. EXAM: PORTABLE CHEST 1 VIEW COMPARISON:  06/26/2020 FINDINGS: Heart size is stable.  New airspace opacities are seen in the peripheral lower lung zones bilaterally, suspicious for atypical infection or less likely edema. IMPRESSION: New bilateral peripheral lower lung airspace opacities, suspicious for atypical infection or less likely edema. Electronically Signed   By: 14/02/2020 M.D.   On: 09/12/2020 18:12    EKG: Independently reviewed. Sinus rhythm, 1st degree AV block.   Assessment/Plan   1. Acute on chronic diastolic CHF; acute hypoxic respiratory failure  - Presents with worsening SOB despite antibiotics earlier this month and recent course of Lasix 20 mg PO qD  - She was recently started on 2 Lpm supplemental O2 and was hypoxic despite that today  - She is found to have BNP of 1724 and bilateral airspace opacities on CXR suggestive of infection vs edema  - EF was preserved in 2014  - She was given Lasix 40 mg IV in ED  - Continue diuresis with Lasix 40 mg IV q12h, monitor weight and I/O, update echocardiogram    2. Hyponatremia  - Serum sodium is 128 on admission in setting of hypervolemia  - Monitor closely while diuresing    3. CKD IIIa  - SCr is 1.03 on admission, similar to priors  - Renally-dose medication, monitor closely while diuresing    4. Dementia  - Continue supportive care, delirium precautions   5. Recent pneumonia - Patient completed a course of Augmentin earlier this month for LLL pneumonia and more recently treated with Rocephin  - Current presentation more consistent with acute CHF, plan to check procalcitonin and hold antibiotics for now    6. Hypertension  - Plan to continue home medications pending pharmacy medication reconciliation   7. Elevated troponin  - HS troponin is 41 in ED without chest pain or acute ischemic features on EKG  - Likely related to acute CHF and respiratory distress  - Check second troponin, follow-up echo    DVT prophylaxis: Lovenox   Code Status: DNR  Level of Care: Level of care: Telemetry Family  Communication: Friend updated at bedside Disposition Plan:  Patient is from: ALF  Anticipated d/c is to: TBD Anticipated d/c date is: 09/15/20 Patient currently: Pending improvement in respiratory status, echocardiogram  Consults called: none  Admission status: Inpatient     09/17/20, MD [facility]  09/12/2020, 10:08 PM

## 2020-09-12 NOTE — ED Notes (Signed)
Patient attempted to get out of bed, removing cardiac monitoring equipment, stating "I'm ready to go home, I've been here so long".  Patient convinced to get back in bed, cardiac monitor leads reapplied.

## 2020-09-12 NOTE — ED Triage Notes (Signed)
Patient recently diagnosed with pneumonia at Nursing home [facility]) and was being treated there and her shortness of breath was worsening.  Patient has been on [facility] since diagnosis and EMS had her on [facility] with sats of 90-94% en route.

## 2020-09-12 NOTE — ED Provider Notes (Signed)
[facility]-EMERGENCY DEPT Provider Note   CSN: [id number] Arrival date & time: 09/12/20  1651     History Chief Complaint  Patient presents with  . Shortness of Breath  . Weakness    [name] is a 87 y.o. female.  Patient is a 87 year old female with a history of coronary artery disease, hyperlipidemia, recurrent pneumonia, seizures and dementia who is [facility] [place] [facility] due to worsening shortness of breath.  Patient was diagnosed with pneumonia at the beginning of February and had a 7-day course of treatment with Augmentin.  However in the last 4 days patient has had worsening generalized weakness, lower extremity edema and hypoxia.  Patient was seen by her PCP on 09/09/2020 and at that time had an x-ray that showed worsening multifocal pneumonia.  She had Dopplers of her lower extremities done at the beginning of this month which were negative for DVT.  She was started 3 days ago on IM Rocephin, placed on chronic oxygen therapy at 2 L and started on Lasix 20 mg.  They felt that the swelling was slightly improved but today the facility reported that she was having increased shortness of breath and struggling to breathe.  They turned her up to 3 L but still oxygen saturation was around 90 to 91%.  On exam here patient denies any complaints at this time.  She reports she does not feel short of breath or have any chest pain.  She denies any pain in her legs or abdominal pain.  She reports she cannot really remember a lot of things in the past but right now she feels okay.  The history is provided by the patient, medical records and the nursing home.  Shortness of Breath Weakness Associated symptoms: shortness of breath        Past Medical History:  Diagnosis Date  . Anemia   . Carotid arterial disease (HCC)    asymptomatic   . Chest tightness   . Coronary artery disease   . High blood pressure   . Leg pain    with  walking  . Palpitations     Patient Active Problem List   Diagnosis Date Noted  . Generalized weakness 09/09/2020  . Peripheral edema 09/09/2020  . Pneumonia of left lower lobe due to infectious organism 08/26/2020  . Frequent falls 08/26/2020  . Hypoxia 08/23/2020  . Insomnia 08/23/2020  . Dysphagia 07/01/2020  . New onset seizure without head trauma (HCC) 06/26/2020  . Slow transit constipation 05/31/2020  . Lower back pain 04/22/2020  . Vitamin D deficiency 02/23/2020  . Restless leg syndrome 02/23/2020  . Mixed dementia (HCC) 02/23/2020  . Osteoporosis 02/23/2020  . GERD (gastroesophageal reflux disease) 02/23/2020  . CKD (chronic kidney disease) stage 3, GFR 30-59 ml/min (HCC) 12/29/2019  . Palpitations   . Chest tightness   . Coronary artery disease   . Carotid arterial disease (HCC)   . Renovascular hypertension 04/17/2016  . Essential hypertension vs Renovascular hbp 12/26/2014  . Postinflammatory pulmonary fibrosis (HCC) 12/25/2014  . Tachycardia 12/13/2014  . Aftercare following surgery of the circulatory system, NEC 11/28/2013  . CAD (coronary artery disease) 09/21/2013  . Right renal artery stenosis (HCC) 09/21/2013  . Hyperlipidemia 09/21/2013  . First degree AV block 09/21/2013  . Carotid stenosis 11/24/2011    Past Surgical History:  Procedure Laterality Date  . CARDIAC CATHETERIZATION  07/15/04   noncritical CAD,80% PLA treated medically. EF% 60. no RAS.  [name]  CAROTID ENDARTERECTOMY     RIGHT=05/11/11, LEFT=03/30/11  . EYE SURGERY  2011   Cataract surgery Bilateral  . EYE SURGERY  2011   Bilateral eyelid surgery for ptosis  . renal artery duplex  02/23/13   ABDOMINAL AORTA:<50% DIAMETER REDUCTION. RIGHT RENAL ARTERY: 60-99% DIAMETER REDUCTION.[name] LEFT RENAL ARTERY:1-59% DIAMETER REDUCTION.     OB History   No obstetric history on file.     Family History  Problem Relation Age of Onset  . Lung disease Mother   . Heart disease Father   . Cancer  Sister        liver cancer  . Heart disease Brother     Social History   Tobacco Use  . Smoking status: Never Smoker  . Smokeless tobacco: Never Used  Substance Use Topics  . Alcohol use: No  . Drug use: No    Home Medications Prior to Admission medications   Medication Sig Start Date End Date Taking? Authorizing Provider  acetaminophen (TYLENOL) 325 MG tablet Take 650 mg by mouth at bedtime.    [provider]  aspirin EC 81 MG tablet Take 1 tablet (81 mg total) by mouth daily. 04/18/15   Croitoru, Mihai, MD  bisacodyl (DULCOLAX) 10 MG suppository Place 10 mg rectally daily as needed for moderate constipation.    [provider]  famotidine (PEPCID) 20 MG tablet Take 20 mg by mouth daily.    [provider]  losartan (COZAAR) 25 MG tablet Take 25 mg by mouth in the morning and at bedtime.    [provider]  memantine (NAMENDA) 5 MG tablet Take 5 mg by mouth 2 (two) times daily.    [provider]  metoprolol tartrate (LOPRESSOR) 25 MG tablet Take 12.5 mg by mouth 2 (two) times daily.    [provider]  pantoprazole (PROTONIX) 40 MG tablet Take 40 mg by mouth daily.    [provider]  rOPINIRole (REQUIP) 1 MG tablet Take 1 mg by mouth every evening.    [provider]  rosuvastatin (CRESTOR) 10 MG tablet Take 10 mg by mouth at bedtime.     [provider]  Vitamin D3 (VITAMIN D) 25 MCG tablet Take 1,000 Units by mouth daily.    [provider]  zinc oxide 20 % ointment Apply 1 application topically as needed for irritation.    [provider]    Allergies    Cardura [doxazosin mesylate], Lotensin [benazepril hcl], and Tramadol  Review of Systems   Review of Systems  Unable to perform ROS: Dementia  Respiratory: Positive for shortness of breath.   Neurological: Positive for weakness.    Physical Exam Updated Vital Signs BP (!) 147/92   Pulse 74   Temp 98.6 F (37 C)  (Oral)   Resp (!) 26   SpO2 100%   Physical Exam Vitals and nursing note reviewed.  Constitutional:      General: She is not in acute distress.    Appearance: She is well-developed, normal weight and well-nourished.  HENT:     Head: Normocephalic and atraumatic.  Eyes:     Extraocular Movements: EOM normal.     Pupils: Pupils are equal, round, and reactive to light.  Cardiovascular:     Rate and Rhythm: Normal rate and regular rhythm.     Pulses: Normal pulses and intact distal pulses.     Heart sounds: Normal heart sounds. No murmur heard. No friction rub.  Pulmonary:  Effort: Pulmonary effort is normal.     Breath sounds: Examination of the left-middle field reveals rales. Examination of the right-lower field reveals rales. Examination of the left-lower field reveals rales. Rales present. No wheezing.  Abdominal:     General: Bowel sounds are normal. There is no distension.     Palpations: Abdomen is soft.     Tenderness: There is no abdominal tenderness. There is no guarding or rebound.  Musculoskeletal:        General: No tenderness. Normal range of motion.     Right lower leg: Edema present.     Left lower leg: Edema present.     Comments: 2+ pitting edema bilateral lower extremities up to the knee  Skin:    General: Skin is warm and dry.     Findings: No rash.  Neurological:     Mental Status: She is alert.     Cranial Nerves: No cranial nerve deficit.     Sensory: No sensory deficit.     Motor: No weakness.     Comments: Oriented to person and place  Psychiatric:        Mood and Affect: Mood and affect and mood normal.        Behavior: Behavior normal.     ED Results / Procedures / Treatments   Labs (all labs ordered are listed, but only abnormal results are displayed) Labs Reviewed  CBC WITH DIFFERENTIAL/PLATELET - Abnormal; Notable for the following components:      Result Value   WBC 12.4 (*)    Neutro Abs 10.4 (*)    All other components within  normal limits  BRAIN NATRIURETIC PEPTIDE - Abnormal; Notable for the following components:   B Natriuretic Peptide 1,723.6 (*)    All other components within normal limits  COMPREHENSIVE METABOLIC PANEL - Abnormal; Notable for the following components:   Sodium 128 (*)    Chloride 90 (*)    Glucose, Bld 107 (*)    BUN 25 (*)    Creatinine, Ser 1.03 (*)    Total Protein 5.9 (*)    Albumin 3.2 (*)    AST 48 (*)    GFR, Estimated 51 (*)    All other components within normal limits  TROPONIN I (HIGH SENSITIVITY) - Abnormal; Notable for the following components:   Troponin I (High Sensitivity) 41 (*)    All other components within normal limits  SARS CORONAVIRUS 2 (TAT 6-24 HRS)    EKG None  Radiology DG Chest Port 1 View  Result Date: 09/12/2020 CLINICAL DATA:  Worsening shortness of breath.  Hypoxia. EXAM: PORTABLE CHEST 1 VIEW COMPARISON:  06/26/2020 FINDINGS: Heart size is stable. New airspace opacities are seen in the peripheral lower lung zones bilaterally, suspicious for atypical infection or less likely edema. IMPRESSION: New bilateral peripheral lower lung airspace opacities, suspicious for atypical infection or less likely edema. Electronically Signed   By: Danae OrleansJohn A Stahl M.D.   On: 09/12/2020 18:12    Procedures Procedures   Medications Ordered in ED Medications - No data to display  ED Course  I have reviewed the triage vital signs and the nursing notes.  Pertinent labs & imaging results that were available during my care of the patient were reviewed by me and considered in my medical decision making (see chart for details).    MDM Rules/Calculators/A&P  Elderly female presenting today with worsening shortness of breath and decreased oxygen saturations.  Patient has recently been seen by her PCP at the facility had x-rays done that showed worsening multifocal pneumonia but also concern for possible CHF exacerbation.  Patient has been receiving  IM Rocephin for the last 3 days been on continuous oxygen therapy and receiving Lasix.  However today they noted she was having worsening shortness of breath.  Patient denies any chest pain at this time and is very comfortable and well-appearing here with oxygen saturation of 100% on 3 L.  She does however appear fluid overloaded with rales bilaterally and distal edema.  Concern for fluid overload.  Lower suspicion for PE at this time as patient did have Dopplers done this month which have been negative for DVT and has no prior history of DVT.  She has had an increased level of care at her nursing facility due to generalized weakness with this worsening illness.  She is being covered currently with Rocephin and at this time is afebrile.  Will repeat chest x-ray, labs including a BNP and EKG.  9:00 PM Patient's BNP today is 1723, chest x-ray with new bilateral peripheral lower lobe airspace opacity suspicious for atypical infection or less likely edema.  Mild leukocytosis of 12.  Troponin is elevated at 41 but suspect strain, mild hyponatremia of 128 but stable creatinine.  Feel that patient symptoms are mostly related to fluid overload based on her exam and labs.  Will give IV Lasix and admit for diuresis.  She currently remained stable on 3 L nasal cannula.  MDM Number of Diagnoses or Management Options   Amount and/or Complexity of Data Reviewed Clinical lab tests: ordered and reviewed Tests in the radiology section of CPT: ordered and reviewed Tests in the medicine section of CPT: ordered and reviewed Decide to obtain previous medical records or to obtain history from someone other than the patient: yes Obtain history from someone other than the patient: yes Review and summarize past medical records: yes Discuss the patient with other providers: yes Independent visualization of images, tracings, or specimens: yes  Risk of Complications, Morbidity, and/or Mortality Presenting problems:  high Diagnostic procedures: moderate Management options: moderate  Patient Progress Patient progress: stable  CRITICAL CARE Performed by: Shayanne Gomm Total critical care time: 30 minutes Critical care time was exclusive of separately billable procedures and treating other patients. Critical care was necessary to treat or prevent imminent or life-threatening deterioration. Critical care was time spent personally by me on the following activities: development of treatment plan with patient and/or surrogate as well as nursing, discussions with consultants, evaluation of patient's response to treatment, examination of patient, obtaining history from patient or surrogate, ordering and performing treatments and interventions, ordering and review of laboratory studies, ordering and review of radiographic studies, pulse oximetry and re-evaluation of patient's condition.  Final Clinical Impression(s) / ED Diagnoses Final diagnoses:  Congestive heart failure, unspecified HF chronicity, unspecified heart failure type [facility])    Rx / DC Orders ED Discharge Orders    None       Gwyneth Sprout, MD 09/12/20 2102

## 2020-09-13 ENCOUNTER — Inpatient Hospital Stay (HOSPITAL_COMMUNITY): Payer: Medicare Other

## 2020-09-13 DIAGNOSIS — I5033 Acute on chronic diastolic (congestive) heart failure: Secondary | ICD-10-CM | POA: Diagnosis not present

## 2020-09-13 DIAGNOSIS — I2510 Atherosclerotic heart disease of native coronary artery without angina pectoris: Secondary | ICD-10-CM

## 2020-09-13 LAB — MAGNESIUM: Magnesium: 2 mg/dL (ref 1.7–2.4)

## 2020-09-13 LAB — ECHOCARDIOGRAM COMPLETE
Area-P 1/2: 4.49 cm2
Height: 62 in
P 1/2 time: 402 msec
S' Lateral: 2 cm
Weight: 1924.8 oz

## 2020-09-13 LAB — MRSA PCR SCREENING: MRSA by PCR: NEGATIVE

## 2020-09-13 LAB — CBC WITH DIFFERENTIAL/PLATELET
Abs Immature Granulocytes: 0.06 10*3/uL (ref 0.00–0.07)
Basophils Absolute: 0 10*3/uL (ref 0.0–0.1)
Basophils Relative: 0 %
Eosinophils Absolute: 0.1 10*3/uL (ref 0.0–0.5)
Eosinophils Relative: 1 %
HCT: 43.9 % (ref 36.0–46.0)
Hemoglobin: 14.3 g/dL (ref 12.0–15.0)
Immature Granulocytes: 1 %
Lymphocytes Relative: 9 %
Lymphs Abs: 1.2 10*3/uL (ref 0.7–4.0)
MCH: 30.4 pg (ref 26.0–34.0)
MCHC: 32.6 g/dL (ref 30.0–36.0)
MCV: 93.4 fL (ref 80.0–100.0)
Monocytes Absolute: 0.8 10*3/uL (ref 0.1–1.0)
Monocytes Relative: 7 %
Neutro Abs: 10.2 10*3/uL — ABNORMAL HIGH (ref 1.7–7.7)
Neutrophils Relative %: 82 %
Platelets: 294 10*3/uL (ref 150–400)
RBC: 4.7 MIL/uL (ref 3.87–5.11)
RDW: 14.4 % (ref 11.5–15.5)
WBC: 12.4 10*3/uL — ABNORMAL HIGH (ref 4.0–10.5)
nRBC: 0 % (ref 0.0–0.2)

## 2020-09-13 LAB — BASIC METABOLIC PANEL WITH GFR
Anion gap: 12 (ref 5–15)
BUN: 22 mg/dL (ref 8–23)
CO2: 28 mmol/L (ref 22–32)
Calcium: 9.5 mg/dL (ref 8.9–10.3)
Chloride: 91 mmol/L — ABNORMAL LOW (ref 98–111)
Creatinine, Ser: 1.09 mg/dL — ABNORMAL HIGH (ref 0.44–1.00)
GFR, Estimated: 48 mL/min — ABNORMAL LOW (ref >60)
Glucose, Bld: 91 mg/dL (ref 70–99)
Potassium: 4.7 mmol/L (ref 3.5–5.1)
Sodium: 131 mmol/L — ABNORMAL LOW (ref 135–145)

## 2020-09-13 LAB — PROCALCITONIN
Procalcitonin: <0.1 ng/mL
Procalcitonin: <0.1 ng/mL

## 2020-09-13 LAB — SARS CORONAVIRUS 2 (TAT 6-24 HRS): SARS Coronavirus 2: NEGATIVE

## 2020-09-13 MED ORDER — ROSUVASTATIN CALCIUM 10 MG PO TABS
10.0000 mg | ORAL_TABLET | Freq: Every day | ORAL | Status: DC
  Administered 2020-09-13 – 2020-09-16 (×4): 10 mg via ORAL
  Filled 2020-09-13 (×4): qty 1

## 2020-09-13 MED ORDER — BISACODYL 10 MG RE SUPP: 10.0000 mg | Freq: Every day | RECTAL | Status: DC | PRN

## 2020-09-13 MED ORDER — ASPIRIN EC 81 MG PO TBEC
81.0000 mg | DELAYED_RELEASE_TABLET | Freq: Every day | ORAL | Status: DC
  Administered 2020-09-13 – 2020-09-16 (×4): 81 mg via ORAL
  Filled 2020-09-13 (×4): qty 1

## 2020-09-13 MED ORDER — SODIUM CHLORIDE 0.9% FLUSH: 10.0000 mL | INTRAVENOUS | Status: DC | PRN

## 2020-09-13 MED ORDER — LOSARTAN POTASSIUM 25 MG PO TABS
25.0000 mg | ORAL_TABLET | Freq: Every day | ORAL | Status: DC
  Administered 2020-09-13 – 2020-09-16 (×4): 25 mg via ORAL
  Filled 2020-09-13 (×4): qty 1

## 2020-09-13 MED ORDER — HALOPERIDOL LACTATE 5 MG/ML IJ SOLN
1.0000 mg | INTRAMUSCULAR | Status: DC | PRN
  Administered 2020-09-13: 1 mg via INTRAMUSCULAR
  Filled 2020-09-13: qty 1

## 2020-09-13 MED ORDER — FAMOTIDINE 20 MG PO TABS
20.0000 mg | ORAL_TABLET | Freq: Every day | ORAL | Status: DC
  Administered 2020-09-13 – 2020-09-16 (×4): 20 mg via ORAL
  Filled 2020-09-13 (×4): qty 1

## 2020-09-13 MED ORDER — VITAMIN D3 25 MCG (1000 UNIT) PO TABS
1000.0000 [IU] | ORAL_TABLET | Freq: Every day | ORAL | Status: DC
  Administered 2020-09-13 – 2020-09-16 (×4): 1000 [IU] via ORAL
  Filled 2020-09-13 (×4): qty 1

## 2020-09-13 MED ORDER — PANTOPRAZOLE SODIUM 40 MG PO TBEC
40.0000 mg | DELAYED_RELEASE_TABLET | Freq: Every day | ORAL | Status: DC
  Administered 2020-09-13 – 2020-09-16 (×4): 40 mg via ORAL
  Filled 2020-09-13 (×4): qty 1

## 2020-09-13 MED ORDER — FUROSEMIDE 40 MG PO TABS
40.0000 mg | ORAL_TABLET | Freq: Once | ORAL | Status: AC
  Administered 2020-09-13: 40 mg via ORAL
  Filled 2020-09-13: qty 1

## 2020-09-13 MED ORDER — MEMANTINE HCL 10 MG PO TABS
5.0000 mg | ORAL_TABLET | Freq: Two times a day (BID) | ORAL | Status: DC
  Administered 2020-09-13 – 2020-09-16 (×8): 5 mg via ORAL
  Filled 2020-09-13 (×8): qty 1

## 2020-09-13 MED ORDER — METOPROLOL TARTRATE 25 MG PO TABS
12.5000 mg | ORAL_TABLET | Freq: Two times a day (BID) | ORAL | Status: DC
  Administered 2020-09-13 – 2020-09-16 (×7): 12.5 mg via ORAL
  Filled 2020-09-13 (×8): qty 1

## 2020-09-13 MED ORDER — ROPINIROLE HCL 1 MG PO TABS
1.0000 mg | ORAL_TABLET | Freq: Every evening | ORAL | Status: DC
  Administered 2020-09-13 – 2020-09-16 (×4): 1 mg via ORAL
  Filled 2020-09-13 (×4): qty 1

## 2020-09-13 MED ORDER — ROPINIROLE HCL 1 MG PO TABS: 1.0000 mg | ORAL_TABLET | Freq: Every evening | ORAL | Status: DC

## 2020-09-13 MED ORDER — PROSOURCE PLUS PO LIQD
30.0000 mL | Freq: Two times a day (BID) | ORAL | Status: DC
  Administered 2020-09-13 – 2020-09-16 (×7): 30 mL via ORAL
  Filled 2020-09-13 (×7): qty 30

## 2020-09-13 MED ORDER — SODIUM CHLORIDE 0.9% FLUSH
10.0000 mL | Freq: Two times a day (BID) | INTRAVENOUS | Status: DC
  Administered 2020-09-14 – 2020-09-15 (×2): 10 mL

## 2020-09-13 MED ORDER — ENSURE ENLIVE PO LIQD
237.0000 mL | ORAL | Status: DC
  Administered 2020-09-15 – 2020-09-16 (×2): 237 mL via ORAL

## 2020-09-13 MED ORDER — HALOPERIDOL 1 MG PO TABS
1.0000 mg | ORAL_TABLET | ORAL | Status: DC | PRN
  Administered 2020-09-14: 1 mg via ORAL
  Filled 2020-09-13 (×2): qty 1

## 2020-09-13 NOTE — Progress Notes (Signed)
VAST RN unable to obtain IV access at this time. Requested unit RN contact physician to have Lasix changed to PO route.

## 2020-09-13 NOTE — Progress Notes (Signed)
PROGRESS NOTE    [name]  QMG:[id number] DOB: 1926/08/12 DOA: 09/12/2020 PCP: Mahlon Gammon, MD    Brief Narrative:  Mrs.Kommer was admitted to the hospital with a working diagnosis of acute on chronic diastolic heart failure exacerbation, complicated by acute hypoxic respiratory failure and hyponatremia.  87 year old female past medical history for dementia, coronary artery disease, hypertension and diastolic heart failure.  Recently treated for pneumonia.  As an outpatient she was treated with Augmentin and supplemental oxygen per nasal cannula, due to persistent symptoms she received 20 mg of furosemide and IM ceftriaxone.  Her symptoms continued to deteriorate despite outpatient medical therapy, she had progressive dyspnea and worsening lower extremity edema.  On her initial physical examination her oximetry was in the low 90s on 3 L/min of supplemental oxygen per nasal cannula, blood pressure 185/84, heart rate 65-77, respiratory rate 26, temperature 98.6, lungs with no rales or wheezing, heart S1-S2, present, rhythmic, abdomen soft nontender, positive pretibial pitting edema bilaterally.  Sodium 128, potassium 4.6, chloride 90, bicarb 28, glucose 107, BUN 25, creatinine 1.0, BNP 1723, troponin I 41, white count 12.4, hemoglobin 14.2, hematocrit 44.5. SARS COVID-19 negative.  Chest radiograph with positive cardiomegaly, bilateral interstitial infiltrates predominantly at lower lobes with small bilateral pleural effusions.  EKG 65 bpm, rightward axis, first-degree AV block, sinus rhythm, Q-wave V1-V3, no significant ST segment or T wave changes.  Assessment & Plan:   Principal Problem:   Acute respiratory failure with hypoxia (HCC) Active Problems:   CAD (coronary artery disease)   Essential hypertension vs Renovascular hbp   Chronic kidney disease, stage 3a (HCC)   Mixed dementia (HCC)   Elevated troponin   Acute on chronic diastolic CHF (congestive heart failure)  (HCC)   Hyponatremia   1. Acute on chronic diastolic heart failure complicated with acute hypoxemic respiratory failure due cardiogenic pulmonary edema.  Urine output since admission documented 1700 cc, with improvement of her symptoms, but not yet back to baseline. Continue to have rales, peripheral edema and moderate JVD. Elevated troponin due to decompensated heart failure, no features of acute coronary syndrome.   Continue medical therapy with furosemide 40 mg Iv q12, blood pressure control with losartan and metoprolol. Follow with echocardiogram.   PT and OT evaluation.   2. HTN and Dyslipidemia. Continue with rosuvastatin.   3. Dementia with episodic delirium/ restless leg syndrome. This am trying to get out of bed, confused and disorientated.  Added as needed haldol for agitation.   Resume memantine and ropiniarole.  Avoid benzodaizepines or narcotics    4. CKD stage 3a. Hyponatremia, Likely related to hypervolemia, after initial diuresis Na is up to 131. Renal function with serum cr at 1,0 with K at 4,7 and bicarbonate at 28.   Patient continue to be at high risk for worsening heart failure   Status is: Inpatient  Remains inpatient appropriate because:IV treatments appropriate due to intensity of illness or inability to take PO   Dispo: The patient is from: ALF              Anticipated d/c is to: ALF              Patient currently is not medically stable to d/c.   Difficult to place patient No   DVT prophylaxis: Enoxaparin   Code Status:   DNR   Family Communication:  I spoke with patient's friend at the bedside, we talked in detail about patient's condition, plan of care and prognosis and all  questions were addressed.        Subjective: Patient is feeling better but not yet back to baseline, continue to have lower extremity edema, no nausea or vomiting, no chest pain.  Positive bilateral lower extremity pain.   Objective: Vitals:   09/13/20 0322 09/13/20  0833 09/13/20 1102 09/13/20 1254  BP: (!) 169/76 (!) 162/79 129/62 130/67  Pulse:  72 75 82  Resp: 20   16  Temp: (!) 97.5 F (36.4 C)  (!) 97.5 F (36.4 C) 97.8 F (36.6 C)  TempSrc: Oral Oral Oral Oral  SpO2: 97%   (!) 86%  Weight: 54.6 kg     Height: 5\' 2"  (1.575 m)       Intake/Output Summary (Last 24 hours) at 09/13/2020 1422 Last data filed at 09/13/2020 1200 Gross per 24 hour  Intake 350 ml  Output 1700 ml  Net -1350 ml   Filed Weights   09/13/20 0322  Weight: 54.6 kg    Examination:   General:  deconditioned  Neurology: Awake and alert, non focal  E ENT: positive pallor, no icterus, oral mucosa moist. Moderate JVD Cardiovascular: No JVD. S1-S2 present, rhythmic, no gallops, rubs, or murmurs. Trace lower extremity edema. Pulmonary: positive breath sounds bilaterally, no wheezing,or rhonchi bilateral rales. Gastrointestinal. Abdomen soft and non tender Skin. No rashes Musculoskeletal: no joint deformities     Data Reviewed: I have personally reviewed following labs and imaging studies  CBC: Recent Labs  Lab 09/12/20 1732 09/13/20 0412  WBC 12.4* 12.4*  NEUTROABS 10.4* 10.2*  HGB 14.2 14.3  HCT 44.5 43.9  MCV 93.3 93.4  PLT 295 294   Basic Metabolic Panel: Recent Labs  Lab 09/12/20 1911 09/13/20 0412  NA 128* 131*  K 4.6 4.7  CL 90* 91*  CO2 28 28  GLUCOSE 107* 91  BUN 25* 22  CREATININE 1.03* 1.09*  CALCIUM 9.2 9.5  MG  --  2.0   GFR: Estimated Creatinine Clearance: 26 mL/min (A) (by C-G formula based on SCr of 1.09 mg/dL (H)). Liver Function Tests: Recent Labs  Lab 09/12/20 1911  AST 48*  ALT 42  ALKPHOS 117  BILITOT 0.8  PROT 5.9*  ALBUMIN 3.2*   No results for input(s): LIPASE, AMYLASE in the last 168 hours. No results for input(s): AMMONIA in the last 168 hours. Coagulation Profile: No results for input(s): INR, PROTIME in the last 168 hours. Cardiac Enzymes: No results for input(s): CKTOTAL, CKMB, CKMBINDEX, TROPONINI in  the last 168 hours. BNP (last 3 results) No results for input(s): PROBNP in the last 8760 hours. HbA1C: No results for input(s): HGBA1C in the last 72 hours. CBG: No results for input(s): GLUCAP in the last 168 hours. Lipid Profile: No results for input(s): CHOL, HDL, LDLCALC, TRIG, CHOLHDL, LDLDIRECT in the last 72 hours. Thyroid Function Tests: No results for input(s): TSH, T4TOTAL, FREET4, T3FREE, THYROIDAB in the last 72 hours. Anemia Panel: No results for input(s): VITAMINB12, FOLATE, FERRITIN, TIBC, IRON, RETICCTPCT in the last 72 hours.    Radiology Studies: I have reviewed all of the imaging during this hospital visit personally     Scheduled Meds: . enoxaparin (LOVENOX) injection  30 mg Subcutaneous Q24H  . furosemide  40 mg Intravenous Q12H   Continuous Infusions: . sodium chloride       LOS: 1 day        Salif Tay 09/14/20, MD

## 2020-09-13 NOTE — Progress Notes (Signed)
Initial Nutrition Assessment  DOCUMENTATION CODES:   Not applicable  INTERVENTION:  - will order Ensure Enlive once/day, each supplement provides 350 kcal and 20 grams of protein. - will order 30 ml Prosource Plus BID, each supplement provides 100 kcal and 15 grams protein.  - complete NFPE at follow-up.   NUTRITION DIAGNOSIS:   Inadequate oral intake related to decreased appetite as evidenced by per patient/family report.  GOAL:   Patient will meet greater than or equal to 90% of their needs  MONITOR:   PO intake,Supplement acceptance,Labs,Weight trends  REASON FOR ASSESSMENT:   Malnutrition Screening Tool  ASSESSMENT:   87 year old female medical history of dementia, CAD, HTN, and CHF. She was recently treated for PNA. She presented to the ED due to persistent and worsening BLE edema.  She consumed 25% of breakfast and lunch today. Patient laying in bed and reports BLE pain and overall feelings of restlessness. She denies any abdominal pain/pressure or nausea. A female who identifies herself as a close friend is at bedside and aids in providing information.  Patient lives at [facility] and does not like the food there so she has not been eating well. Even since admission appetite has been fair. She denies chewing or swallowing difficulties. She enjoys soup, especially potato soup.   Weight today is 120 lb and weight is significantly up (+10 lb) over the past 1 month.  Friend expresses concern that staff at [facility] was not monitoring edema and providing appropriate interventions.   Patient was ambulating to and from the dining room at the facility until a few weeks ago when edema began to worsen making it very difficult for her to safely walk.   Labs reviewed; Na: 131 mmol/l, Cl: 91 mmol/l, creatinine: 1.09 mg/dl, GFR: 48 ml/min. Medications reviewed; 1000 units cholecalciferol/day, 20 mg oral pepcid/day, 40 mg IV lasix BID, 40 mg oral protonix/day.     NUTRITION  - FOCUSED PHYSICAL EXAM:  unable to complete at this time per patient's request d/t BLE pain.  Diet Order:   Diet Order            Diet 2 gram sodium Room service appropriate? Yes; Fluid consistency: Thin  Diet effective now                 EDUCATION NEEDS:   Not appropriate for education at this time  Skin:  Skin Assessment: Reviewed RN Assessment  Last BM:  PTA/unknown  Height:   Ht Readings from Last 1 Encounters:  09/13/20 5\' 2"  (1.575 m)    Weight:   Wt Readings from Last 1 Encounters:  09/13/20 54.6 kg     Estimated Nutritional Needs:  Kcal:  1200-1400 kcal Protein:  50-65 grams Fluid:  >/= 1.2 L/day      09/15/20, MS, RD, LDN, CNSC Inpatient Clinical Dietitian RD pager # available in [facility]  After hours/weekend pager # available in [facility]

## 2020-09-13 NOTE — Progress Notes (Signed)
Shift Summary:   Confused the majority of the shift. Alert to self and family member. Remained on 3L of nasal cannula during this shift. Given Haloperidol during this shift with no results. Family friend [place] visited to keep patient calm and relaxed. Patient currently has no IV access, reached out to MD Arrien again as patient has IV Lasix ordered. Adequate urine output via pure wick, purewick remains in place. Echo completed during this shift. Awaiting orders.

## 2020-09-13 NOTE — Progress Notes (Signed)
Restless, friend Andrey Campanile at bedside, Andrey Campanile mentioned patient does have a history of having restless legs. Followed up with MD Arrien to see if medication could be added to patient regimen.

## 2020-09-13 NOTE — Progress Notes (Signed)
Attempting to get OOB. Able to reorient for a short period of time. Confusion noted.  Made Arrien Fuller Song MD aware.

## 2020-09-13 NOTE — Progress Notes (Signed)
Spoke with son, son endorses he is all the way in [place]. Son endorsed it was okay to list [facility] as designated visitor. Spoke with Andrey Campanile and sandy stated she would be headed to the hospital to visit patient. Will continue to monitor.

## 2020-09-13 NOTE — Progress Notes (Signed)
Received patient from ED at 0307. Patient is alert but forgetful. Stable sats at 97% on 3L via [place]. Denies pain or discomfort at this time. Oriented to room, call bell placed within reach.

## 2020-09-13 NOTE — Plan of Care (Signed)
Alert and oriented to self. RR even and unlabored, On 3L of nasal cannula. Disoriented to time, place,situation. No IV in place at this time, IV team unsuccessful x 2. Made MD aware of patient not having IV access. Bed low and locked. Call bell within reach. Will continue to monitor.  Problem: Clinical Measurements: Goal: Will remain free from infection Outcome: Progressing Goal: Diagnostic test results will improve Outcome: Progressing Goal: Respiratory complications will improve Outcome: Progressing   Problem: Nutrition: Goal: Adequate nutrition will be maintained Outcome: Progressing   Problem: Skin Integrity: Goal: Risk for impaired skin integrity will decrease Outcome: Progressing

## 2020-09-13 NOTE — Progress Notes (Signed)
  Echocardiogram 2D Echocardiogram has been performed.  Dorena Dew Joette Schmoker 09/13/2020, 2:31 PM

## 2020-09-13 NOTE — Progress Notes (Signed)
Constantly attempting to get OOB. Not easily reoriented.When attempting to stand patient up, patient to weak to stand. Given Haloperidol per MAR. Will continue to monitor.

## 2020-09-14 LAB — BASIC METABOLIC PANEL
Anion gap: 13 (ref 5–15)
BUN: 21 mg/dL (ref 8–23)
CO2: 29 mmol/L (ref 22–32)
Calcium: 9.4 mg/dL (ref 8.9–10.3)
Chloride: 89 mmol/L — ABNORMAL LOW (ref 98–111)
Creatinine, Ser: 0.85 mg/dL (ref 0.44–1.00)
GFR, Estimated: >60 mL/min (ref >60)
Glucose, Bld: 114 mg/dL — ABNORMAL HIGH (ref 70–99)
Potassium: 4.1 mmol/L (ref 3.5–5.1)
Sodium: 131 mmol/L — ABNORMAL LOW (ref 135–145)

## 2020-09-14 MED ORDER — OLANZAPINE 5 MG PO TABS
2.5000 mg | ORAL_TABLET | Freq: Every day | ORAL | Status: DC
  Administered 2020-09-14 – 2020-09-16 (×3): 2.5 mg via ORAL
  Filled 2020-09-14 (×3): qty 1

## 2020-09-14 NOTE — Plan of Care (Signed)
  Problem: Nutrition: Goal: Adequate nutrition will be maintained Outcome: Progressing   Problem: Skin Integrity: Goal: Risk for impaired skin integrity will decrease Outcome: Progressing   

## 2020-09-14 NOTE — Plan of Care (Signed)
  Problem: Clinical Measurements: Goal: Will remain free from infection Outcome: Progressing Goal: Diagnostic test results will improve Outcome: Progressing Goal: Respiratory complications will improve Outcome: Progressing   Problem: Nutrition: Goal: Adequate nutrition will be maintained Outcome: Progressing   Problem: Skin Integrity: Goal: Risk for impaired skin integrity will decrease Outcome: Progressing   

## 2020-09-14 NOTE — Progress Notes (Signed)
PROGRESS NOTE    BRANDE UNCAPHER  KVQ:[id number] DOB: 04-06-1927 DOA: 09/12/2020 PCP: Mahlon Gammon, MD    Brief Narrative:  Mrs.Abelson was admitted to the hospital with a working diagnosis of acute on chronic diastolic heart failure exacerbation, complicated by acute hypoxic respiratory failure and hyponatremia.  87 year old female past medical history for dementia, coronary artery disease, hypertension and diastolic heart failure.  Recently treated for pneumonia.  As an outpatient she was treated with Augmentin and supplemental oxygen per nasal cannula, due to persistent symptoms she received 20 mg of furosemide and IM ceftriaxone.  Her symptoms continued to deteriorate despite outpatient medical therapy, she had progressive dyspnea and worsening lower extremity edema.  On her initial physical examination her oximetry was in the low 90s on 3 L/min of supplemental oxygen per nasal cannula, blood pressure 185/84, heart rate 65-77, respiratory rate 26, temperature 98.6, lungs with no rales or wheezing, heart S1-S2, present, rhythmic, abdomen soft nontender, positive pretibial pitting edema bilaterally.  Sodium 128, potassium 4.6, chloride 90, bicarb 28, glucose 107, BUN 25, creatinine 1.0, BNP 1723, troponin I 41, white count 12.4, hemoglobin 14.2, hematocrit 44.5. SARS COVID-19 negative.  Chest radiograph with positive cardiomegaly, bilateral interstitial infiltrates predominantly at lower lobes with small bilateral pleural effusions.  EKG 65 bpm, rightward axis, first-degree AV block, sinus rhythm, Q-wave V1-V3, no significant ST segment or T wave changes   Assessment & Plan:   Principal Problem:   Acute respiratory failure with hypoxia (HCC) Active Problems:   CAD (coronary artery disease)   Essential hypertension vs Renovascular hbp   Chronic kidney disease, stage 3a (HCC)   Mixed dementia (HCC)   Elevated troponin   Acute on chronic diastolic CHF (congestive heart failure)  (HCC)   Hyponatremia    1. Acute on chronic diastolic heart failure complicated with acute hypoxemic respiratory failure due cardiogenic pulmonary edema.  Improved volume status with a urine output of 2,150 ml cc, over last 24 hrs. Systolic blood pressure 116 mmHg, with stable renal function. . Elevated troponin due to decompensated heart failure, no features of acute coronary syndrome.   Echocardiogram with preserved LV systolic function with EF 60 to 65% on LV. Reduced RV systolic function RV systolic pressure 81.5 mmHg, consistent with pulmonary HTN.  Bi atrial enlargement.   Continue blood pressure control with losartan and metoprolol.   Plan to hold on furosemide and will use as needed in case of volume overload.  Plan for home health services at the assisted living facility.    2. HTN and Dyslipidemia. Blood pressure control with losartan and metoprolol. Hold on further furosemide. Continue with rosuvastatin.   3. Dementia with episodic delirium/ restless leg syndrome. Patient very agitated last night, not able to sleep all night, this afternoon at the time of my examination she is somnolent, her friend is at the bedside.  Last night she received haldol with no much improvement in agitation.  Continue with memantine and ropiniarole.  Add qhs olanzapine tonight low dose. Continue neuro checks per unit protocol, aspiration and fall precautions.    4. CKD stage 3a. Hyponatremia, Na continue to be low at 131, renal function with stable serum cr at 0.85 with bicarbonate at 21. Continue close follow up of renal function and electrolytes. Hold on furosemide for now.   Patient continue to be at high risk for worsening encephalopathy   Status is: Inpatient  Remains inpatient appropriate because:Inpatient level of care appropriate due to severity of illness  Dispo: The patient is from: ALF              Anticipated d/c is to: ALF              Patient currently is not medically  stable to d/c.   Difficult to place patient No   DVT prophylaxis: Enoxaparin   Code Status:   DNR   Family Communication:  I spoke with patient's friend at the bedside, we talked in detail about patient's condition, plan of care and prognosis and all questions were addressed.      Nutrition Status: Nutrition Problem: Inadequate oral intake Etiology: decreased appetite Signs/Symptoms: per patient/family report Interventions: Ensure Enlive (each supplement provides 350kcal and 20 grams of protein),Prostat     Subjective: Patient this am was feeling well, but at the time of my examination she is very somnolent and hypo-reactive. Last night agitated and not able to sleep, refractive to haldol.   Objective: Vitals:   09/13/20 2313 09/14/20 0403 09/14/20 0448 09/14/20 1244  BP: (!) 120/92  (!) 127/96 116/90  Pulse: 91  92 83  Resp:   20 16  Temp:   98.4 F (36.9 C) 97.6 F (36.4 C)  TempSrc:    Oral  SpO2:   93% 91%  Weight:  52.7 kg    Height:  5\' 2"  (1.575 m)      Intake/Output Summary (Last 24 hours) at 09/14/2020 1317 Last data filed at 09/14/2020 0818 Gross per 24 hour  Intake 840 ml  Output 2850 ml  Net -2010 ml   Filed Weights   09/13/20 0322 09/14/20 0403  Weight: 54.6 kg 52.7 kg    Examination:   General: Deconditioned and ill looking appearing  Neurology: somnolent but easy to arouse E ENT: positive pallor, no icterus, oral mucosa dry  Cardiovascular: No JVD. S1-S2 present, rhythmic, no gallops, rubs, or murmurs. No lower extremity edema. Pulmonary: positive breath sounds bilaterally, with no wheezing, rhonchi or rales. Poor inspiratory effort.  Gastrointestinal. Abdomen soft and non tender Skin. No rashes Musculoskeletal: no joint deformities     Data Reviewed: I have personally reviewed following labs and imaging studies  CBC: Recent Labs  Lab 09/12/20 1732 09/13/20 0412  WBC 12.4* 12.4*  NEUTROABS 10.4* 10.2*  HGB 14.2 14.3  HCT 44.5 43.9   MCV 93.3 93.4  PLT 295 294   Basic Metabolic Panel: Recent Labs  Lab 09/12/20 1911 09/13/20 0412 09/14/20 0500  NA 128* 131* 131*  K 4.6 4.7 4.1  CL 90* 91* 89*  CO2 28 28 29   GLUCOSE 107* 91 114*  BUN 25* 22 21  CREATININE 1.03* 1.09* 0.85  CALCIUM 9.2 9.5 9.4  MG  --  2.0  --    GFR: Estimated Creatinine Clearance: 33.4 mL/min (by C-G formula based on SCr of 0.85 mg/dL). Liver Function Tests: Recent Labs  Lab 09/12/20 1911  AST 48*  ALT 42  ALKPHOS 117  BILITOT 0.8  PROT 5.9*  ALBUMIN 3.2*   No results for input(s): LIPASE, AMYLASE in the last 168 hours. No results for input(s): AMMONIA in the last 168 hours. Coagulation Profile: No results for input(s): INR, PROTIME in the last 168 hours. Cardiac Enzymes: No results for input(s): CKTOTAL, CKMB, CKMBINDEX, TROPONINI in the last 168 hours. BNP (last 3 results) No results for input(s): PROBNP in the last 8760 hours. HbA1C: No results for input(s): HGBA1C in the last 72 hours. CBG: No results for input(s): GLUCAP in the last 168  hours. Lipid Profile: No results for input(s): CHOL, HDL, LDLCALC, TRIG, CHOLHDL, LDLDIRECT in the last 72 hours. Thyroid Function Tests: No results for input(s): TSH, T4TOTAL, FREET4, T3FREE, THYROIDAB in the last 72 hours. Anemia Panel: No results for input(s): VITAMINB12, FOLATE, FERRITIN, TIBC, IRON, RETICCTPCT in the last 72 hours.    Radiology Studies: I have reviewed all of the imaging during this hospital visit personally     Scheduled Meds: . (feeding supplement) PROSource Plus  30 mL Oral BID BM  . aspirin EC  81 mg Oral Daily  . cholecalciferol  1,000 Units Oral Daily  . enoxaparin (LOVENOX) injection  30 mg Subcutaneous Q24H  . famotidine  20 mg Oral Daily  . feeding supplement  237 mL Oral Q24H  . furosemide  40 mg Intravenous Q12H  . losartan  25 mg Oral Daily  . memantine  5 mg Oral BID  . metoprolol tartrate  12.5 mg Oral BID  . pantoprazole  40 mg Oral  Daily  . rOPINIRole  1 mg Oral QPM  . rosuvastatin  10 mg Oral QHS  . sodium chloride flush  10-40 mL Intracatheter Q12H   Continuous Infusions: . sodium chloride       LOS: 2 days        Husein Guedes Annett Gula, MD

## 2020-09-14 NOTE — Progress Notes (Signed)
Nurse was given report that pt had pulled out IV and IV team had tried to get a peripheral IV and was unsuccessful. Patient had IV Lasix ordered, so nurse reached out to on call provider about getting an order for patient to take Lasix by mouth, since patient still did not have an IV. On call provider gave new orders for Lasix 40 mg tab by mouth and to insert a midline catheter.

## 2020-09-14 NOTE — Evaluation (Signed)
Physical Therapy Evaluation Patient Details Name: [name] MRN: [id number] DOB: 1927-02-01 Today's Date: 09/14/2020   History of Present Illness  87 year old female past medical history for dementia, coronary artery disease, hypertension and diastolic heart failure.  Recently treated for pneumonia.  Pt admitted for Acute on chronic diastolic heart failure complicated with acute hypoxemic respiratory failure due cardiogenic pulmonary edema.  Clinical Impression  Pt admitted with above diagnosis.  Pt currently with functional limitations due to the deficits listed below (see PT Problem List). Pt will benefit from skilled PT to increase their independence and safety with mobility to allow discharge to the venue listed below.  Pt assisted with ambulating to/from bathroom and fatigued very quickly. Pt also presents with posterior bias upon standing requiring assist and cues for correction.  Pt from ALF and pt's family friend in room reports helping pt at ALF.  Family friend also states pt did not thrive in SNF on recent stay. Pt would benefit from assist with mobility upon d/c for safety and HHPT.  Pt may need increased care in ALF.     Follow Up Recommendations Supervision/Assistance - 24 hour;Home health PT    Equipment Recommendations  None recommended by PT    Recommendations for Other Services       Precautions / Restrictions Precautions Precautions: Fall Precaution Comments: on 2L O2 [place] Restrictions Weight Bearing Restrictions: No      Mobility  Bed Mobility Overal bed mobility: Needs Assistance             General bed mobility comments: OOB in recliner    Transfers Overall transfer level: Needs assistance Equipment used: Rolling walker (2 wheeled) Transfers: Sit to/from [employer] Sit to Stand: Mod assist Stand pivot transfers: Min assist       General transfer comment: Mod assist to rise from low seat. Min assist for steadying and  posterior lean/loss of balance.  Ambulation/Gait Ambulation/Gait assistance: Min assist Gait Distance (Feet): 12 Feet (x2) Assistive device: Rolling walker (2 wheeled) Gait Pattern/deviations: Step-through pattern;Decreased stride length;Narrow base of support Gait velocity: decr   General Gait Details: cues for hand placement on RW and positioning (pt has been using rollator), cues for bigger steps, remained on 2L O2 [place], pt reports extreme fatigue and weakness with ambulating to bathroom  Stairs            Wheelchair Mobility    Modified Rankin (Stroke Patients Only)       Balance Overall balance assessment: Needs assistance Sitting-balance support: Feet supported Sitting balance-Leahy Scale: Fair   Postural control: Posterior lean Standing balance support: Bilateral upper extremity supported;During functional activity Standing balance-Leahy Scale: Poor Standing balance comment: reliant on external support upon initial stand, cues to correct posterior lean                             Pertinent Vitals/Pain Pain Assessment: No/denies pain    Home Living Family/patient expects to be discharged to:: Assisted living               Home Equipment: Shower seat;Grab bars - toilet;Grab bars - tub/shower;Hospital bed;Walker - 4 wheels Additional Comments: From [facility]. Prior to December patient independent with ADLs and ambulating ad lib with rollator. Since hospitilization patient needing more assistance and not ambulating to dining room.    Prior Function Level of Independence: Needs assistance   Gait / Transfers Assistance Needed: min guard  ADL's / Homemaking  Assistance Needed: increased assistance for ADLs  Comments: From prior note: Per RN at facility, pt was cognitively intact except intermittent bouts of a little confusion. RN stated they are not able to provide continual close min guard assist and cues for all tasks and mobility at their  facility.     Hand Dominance   Dominant Hand: Right    Extremity/Trunk Assessment   Upper Extremity Assessment Upper Extremity Assessment: Generalized weakness    Lower Extremity Assessment Lower Extremity Assessment: Generalized weakness    Cervical / Trunk Assessment Cervical / Trunk Assessment: Kyphotic  Communication   Communication: No difficulties  Cognition Arousal/Alertness: Awake/alert Behavior During Therapy: WFL for tasks assessed/performed Overall Cognitive Status: Within Functional Limits for tasks assessed                                 General Comments: Hx of dementia - intermittent confusion/forgetfullness. Able to follow all commands.      General Comments      Exercises     Assessment/Plan    PT Assessment Patient needs continued PT services  PT Problem List Decreased strength;Decreased mobility;Decreased activity tolerance;Decreased balance;Decreased knowledge of use of DME;Decreased safety awareness       PT Treatment Interventions DME instruction;Gait training;Balance training;Therapeutic exercise;Functional mobility training;Therapeutic activities;Patient/family education    PT Goals (Current goals can be found in the Care Plan section)  Acute Rehab PT Goals Patient Stated Goal: to return to ALF PT Goal Formulation: With patient Time For Goal Achievement: 10/22/2020 Potential to Achieve Goals: Good    Frequency Min 2X/week   Barriers to discharge        Co-evaluation PT/OT/SLP Co-Evaluation/Treatment: Yes Reason for Co-Treatment: Complexity of the patient's impairments (multi-system involvement);To address functional/ADL transfers PT goals addressed during session: Mobility/safety with mobility OT goals addressed during session: ADL's and self-care       AM-PAC PT "6 Clicks" Mobility  Outcome Measure Help needed turning from your back to your side while in a flat bed without using bedrails?: A Little Help needed  moving from lying on your back to sitting on the side of a flat bed without using bedrails?: A Little Help needed moving to and from a bed to a chair (including a wheelchair)?: A Lot Help needed standing up from a chair using your arms (e.g., wheelchair or bedside chair)?: A Lot Help needed to walk in hospital room?: A Little Help needed climbing 3-5 steps with a railing? : A Lot 6 Click Score: 15    End of Session Equipment Utilized During Treatment: Gait belt;Oxygen Activity Tolerance: Patient tolerated treatment well Patient left: in chair;with call bell/phone within reach;with chair alarm set;with family/visitor present Nurse Communication: Mobility status PT Visit Diagnosis: Other abnormalities of gait and mobility (R26.89)    Time: 0258-5277 PT Time Calculation (min) (ACUTE ONLY): 22 min   Charges:   PT Evaluation $PT Eval Low Complexity: 1 Low     Kati PT, DPT Acute Rehabilitation Services Pager: [phone] Office: [phone] Sarajane Jews 09/14/2020, 12:59 PM

## 2020-09-14 NOTE — Progress Notes (Signed)
Nurse went into patient's room and patient was trying to get out of the bed by putting her legs through the bed rails and ended up getting a skin tear on lower left anterior leg on the shin. Area was cleansed and foam with petroleum was applied.

## 2020-09-14 NOTE — Evaluation (Signed)
Occupational Therapy Evaluation Patient Details Name: [name] MRN: [id number] DOB: 1926-09-18 Today's Date: 09/14/2020    History of Present Illness 87 year old female past medical history for dementia, coronary artery disease, hypertension and diastolic heart failure.  Recently treated for pneumonia.  Pt admitted for Acute on chronic diastolic heart failure complicated with acute hypoxemic respiratory failure due cardiogenic pulmonary edema.   Clinical Impression   [name] is a pleasant 87 year old woman who presents to hospital with acute CHF and above pertinent medical history. Patient is typically independent with ADLs and uses a rollator for ambulation at her ALF. However, since her hospitalization in December patient has needed more assistance for ADLs and ambulation limited. On evaluation patient demonstrates generalized weakness, decreased activity tolerance and impaired balance. Patient requiring mod  assistance to stand from low surface (recliner and toilet) and min assist to ambulate secondary to posterior lean and imbalances. Patient ambulated to bathroom needing max assistance for toileting. Patient reports fatigue after minimal activity and declined grooming task at sink to return to recliner. Patient able to don lower body clothing in seated position but needing assistance to pull clothing up due to impaired balance. Patient will benefit from skilled OT services while in hospital to improve deficits and learn compensatory strategies as needed in order to return to PLOF. Recommend HH services at ALF on discharge.    Follow Up Recommendations  Home health OT    Equipment Recommendations  None recommended by OT    Recommendations for Other Services       Precautions / Restrictions Precautions Precautions: Fall Restrictions Weight Bearing Restrictions: No      Mobility Bed Mobility Overal bed mobility: Needs Assistance             General bed  mobility comments: OOB in recliner    Transfers Overall transfer level: Needs assistance Equipment used: Rolling walker (2 wheeled) Transfers: Sit to/from [employer] Sit to Stand: Mod assist Stand pivot transfers: Min assist       General transfer comment: Mod assist to rise from low seat. Min assist for steadying and posterior lean/loss of balance.    Balance Overall balance assessment: Needs assistance Sitting-balance support: Feet supported Sitting balance-Leahy Scale: Fair   Postural control: Posterior lean Standing balance support: Bilateral upper extremity supported;During functional activity Standing balance-Leahy Scale: Poor                             ADL either performed or assessed with clinical judgement   ADL Overall ADL's : Needs assistance/impaired Eating/Feeding: Set up   Grooming: Set up;Sitting   Upper Body Bathing: Set up;Sitting   Lower Body Bathing: Minimal assistance;Set up;Sit to/from stand   Upper Body Dressing : Set up;Sitting   Lower Body Dressing: Sit to/from stand;Moderate assistance   Toilet Transfer: Teacher, adult education;Ambulation;Moderate assistance Toilet Transfer Details (indicate cue type and reason): mod assist to rise from toilet Toileting- Clothing Manipulation and Hygiene: Maximal assistance;Sit to/from stand Toileting - Clothing Manipulation Details (indicate cue type and reason): able to wipe self in seated position, needed assistance for clothing management - limited by impaired balance             Vision Patient Visual Report: No change from baseline       Perception     Praxis      Pertinent Vitals/Pain Pain Assessment: No/denies pain     Hand Dominance Right  Extremity/Trunk Assessment Upper Extremity Assessment Upper Extremity Assessment: Generalized weakness   Lower Extremity Assessment Lower Extremity Assessment: Defer to PT evaluation   Cervical / Trunk  Assessment Cervical / Trunk Assessment: Kyphotic   Communication Communication Communication: No difficulties   Cognition Arousal/Alertness: Awake/alert Behavior During Therapy: WFL for tasks assessed/performed Overall Cognitive Status: Within Functional Limits for tasks assessed                                 General Comments: Hx of dementia - intermittent confusion/forgetfullness. Able to follow all commands.   General Comments       Exercises     Shoulder Instructions      Home Living Family/patient expects to be discharged to:: Assisted living                             Home Equipment: Shower seat;Grab bars - toilet;Grab bars - tub/shower;Hospital bed;Walker - 4 wheels   Additional Comments: From [facility]. Prior to December patient independent with ADLs and ambulating ad lib with rollator. Since hospitilization patient needing more assistance and not ambulating to dining room.      Prior Functioning/Environment Level of Independence: Needs assistance  Gait / Transfers Assistance Needed: min guard ADL's / Homemaking Assistance Needed: increased assistance for ADLs   Comments: From prior note: Per RN at facility, pt was cognitively intact except intermittent bouts of a little confuision. RN stated they are not able to provide continual close min guard assist and cues for all tasks and mobility at their facility.        OT Problem List: Decreased strength;Decreased activity tolerance;Impaired balance (sitting and/or standing);Decreased safety awareness;Decreased cognition;Decreased knowledge of use of DME or AE;Cardiopulmonary status limiting activity      OT Treatment/Interventions: Self-care/ADL training;Therapeutic exercise;DME and/or AE instruction;Therapeutic activities;Balance training;Patient/family education    OT Goals(Current goals can be found in the care plan section) Acute Rehab OT Goals Patient Stated Goal: to return to  ALF OT Goal Formulation: With patient/family Time For Goal Achievement: 01-Oct-2020 Potential to Achieve Goals: Good  OT Frequency: Min 2X/week   Barriers to D/C:            Co-evaluation              AM-PAC OT "6 Clicks" Daily Activity     Outcome Measure Help from another person eating meals?: A Little Help from another person taking care of personal grooming?: A Little Help from another person toileting, which includes using toliet, bedpan, or urinal?: A Lot Help from another person bathing (including washing, rinsing, drying)?: A Little Help from another person to put on and taking off regular upper body clothing?: A Little Help from another person to put on and taking off regular lower body clothing?: A Lot 6 Click Score: 16   End of Session Equipment Utilized During Treatment: Gait belt;Rolling walker Nurse Communication: Mobility status  Activity Tolerance: Patient limited by fatigue Patient left: in chair;with call bell/phone within reach;with chair alarm set;with family/visitor present  OT Visit Diagnosis: Unsteadiness on feet (R26.81);Muscle weakness (generalized) (M62.81)                Time: 4496-7591 OT Time Calculation (min): 19 min Charges:  OT General Charges $OT Visit: 1 Visit OT Evaluation $OT Eval Moderate Complexity: 1 Mod  Dinah Lupa, OTR/L Acute Care Rehab Services  Office [phone] Pager: [phone]  Kelli Churn 09/14/2020, 12:38 PM

## 2020-09-15 LAB — BASIC METABOLIC PANEL
Anion gap: 11 (ref 5–15)
BUN: 27 mg/dL — ABNORMAL HIGH (ref 8–23)
CO2: 32 mmol/L (ref 22–32)
Calcium: 8.8 mg/dL — ABNORMAL LOW (ref 8.9–10.3)
Chloride: 90 mmol/L — ABNORMAL LOW (ref 98–111)
Creatinine, Ser: 0.87 mg/dL (ref 0.44–1.00)
GFR, Estimated: >60 mL/min (ref >60)
Glucose, Bld: 99 mg/dL (ref 70–99)
Potassium: 3.2 mmol/L — ABNORMAL LOW (ref 3.5–5.1)
Sodium: 133 mmol/L — ABNORMAL LOW (ref 135–145)

## 2020-09-15 MED ORDER — POTASSIUM CHLORIDE 20 MEQ PO PACK
40.0000 meq | PACK | ORAL | Status: AC
  Administered 2020-09-15 (×2): 40 meq via ORAL
  Filled 2020-09-15 (×2): qty 2

## 2020-09-15 NOTE — Progress Notes (Signed)
PROGRESS NOTE    [name]  XID:[id number] DOB: [birth year] DOA: 09/12/2020 PCP: Lindsay Gammon, MD    Brief Narrative:  Lindsay.Ohlsonwas admitted to the hospital with a working diagnosis of acute on chronic diastolic heart failure exacerbation, complicated by acute hypoxic respiratory failure and hyponatremia.  87 year old female past medical history for dementia, coronary artery disease, hypertension and diastolic heart failure. Recently treated for pneumonia. As an outpatient she was treated with Augmentin and supplemental oxygen per nasal cannula, due to persistent symptoms she received 20 mg of furosemide and IM ceftriaxone. Her symptoms continued to deteriorate despite outpatient medical therapy, she had progressive dyspnea and worsening lower extremity edema. On her initial physical examination her oximetry was in the low 90s on 3 L/min of supplemental oxygen per nasal cannula, blood pressure 185/84, heart rate 65-77, respiratory rate 26, temperature 98.6, lungs with no rales or wheezing, heart S1-S2, present, rhythmic, abdomen soft nontender, positive pretibial pitting edema bilaterally.  Sodium 128, potassium 4.6, chloride 90, bicarb 28, glucose 107,BUN 25, creatinine 1.0, BNP 1723, troponin I 41, white count 12.4, hemoglobin 14.2, hematocrit44.5. SARS COVID-19 negative.  Chest radiograph with positive cardiomegaly, bilateral interstitial infiltrates predominantly at lower lobes with small bilateral pleural effusions.  EKG 65 bpm, rightward axis, first-degree AV block, sinus rhythm, Q-wave V1-V3, no significant ST segment or T wave changes  She responded well to diuresis with furosemide IV, her echocardiogram showed preserved LV systolic function, but severe pulmonary hypertension and decreased RV systolic function.  Elevated troponin due to decompensated heart failure, no features of acute coronary syndrome.   Developed worsening metabolic encephalopathy with features  of delirium.   Assessment & Plan:   Principal Problem:   Acute respiratory failure with hypoxia (HCC) Active Problems:   CAD (coronary artery disease)   Essential hypertension vs Renovascular hbp   Chronic kidney disease, stage 3a (HCC)   Mixed dementia (HCC)   Elevated troponin   Acute on chronic diastolic CHF (congestive heart failure) (HCC)   Hyponatremia   1. Acute on chronic diastolic heart failure/ RV systolic dysfunction with pulmonary hypertension, complicated with acute hypoxemic respiratory failure due cardiogenic pulmonary edema. (acute core pulmonale). Echocardiogram with preserved LV systolic function with EF 60 to 65% on LV. Reduced RV systolic function RV systolic pressure 81.5 mmHg, consistent with pulmonary HTN.  Biatrial enlargement.   Urine output over last 24 hrs is 1,150 ml. Improved volume status, patient is euvolemic.  Continue to hold on furosemide, patient with RV failure and pre-load dependent.  Blood pressure control with metoprolol and losartan.  Plan to use furosemide PRN.    2. HTN and Dyslipidemia. On losartan and metoprolol for blood pressure control.  On rosuvastatin.   3. Dementia with NEW metabolic encephalopathy with delirium/ restless leg syndrome. patient somnolent most of the days yesterday, last night received olanzapine with  good toleration, no further nocturnal agitation   This am has mild somnolence but improved from yesterday, will continue neuro checks per unit protocol as needed haldol during the day and scheduled olanzapine at night.  Continue with memantine and ropinirole.   If improvement in her mentation, she can be discharge in the next 24 hrs.   4. CKD stage 3a. Hyponatremia/ hypokalemia, stable renal function with serum cr at 0,87, K is 3,2 and Na at 133 with serum bicarbonate 32 and cr at 0,87. Add 80 meq Kcl in 2 divided doses and follow up renal function and electrolytes in am.     Status  is: Inpatient  Remains  inpatient appropriate because:Inpatient level of care appropriate due to severity of illness   Dispo: The patient is from: ALF              Anticipated d/c is to: ALF              Patient currently is not medically stable to d/c.   Difficult to place patient No    DVT prophylaxis: Enoxaparin   Code Status:   DNR   Family Communication:  No family at the bedside      Nutrition Status: Nutrition Problem: Inadequate oral intake Etiology: decreased appetite Signs/Symptoms: per patient/family report Interventions: Ensure Enlive (each supplement provides 350kcal and 20 grams of protein),Prostat     Subjective: Patient is somnolent this am but improved from yesterday, last night with no agitation, she slept most of the day yesterday.   Objective: Vitals:   09/14/20 0403 09/14/20 0448 09/14/20 1244 09/15/20 0500  BP:  (!) 127/96 116/90   Pulse:  92 83   Resp:  20 16   Temp:  98.4 F (36.9 C) 97.6 F (36.4 C)   TempSrc:   Oral   SpO2:  93% 91%   Weight: 52.7 kg   50.2 kg  Height: 5\' 2"  (1.575 m)       Intake/Output Summary (Last 24 hours) at 09/15/2020 1020 Last data filed at 09/14/2020 2100 Gross per 24 hour  Intake --  Output 450 ml  Net -450 ml   Filed Weights   09/13/20 0322 09/14/20 0403 09/15/20 0500  Weight: 54.6 kg 52.7 kg 50.2 kg    Examination:   General: Not in pain or dyspnea, deconditioned and ill looking appearing.  Neurology: mild somnolence, follows commands and answers simple questions.  E ENT: positive pallor, no icterus, oral mucosa dry.  Cardiovascular: No JVD. S1-S2 present, rhythmic, no gallops, rubs, or murmurs. No lower extremity edema. Pulmonary: positive breath sounds bilaterally,with no wheezing, rhonchi or rales. Gastrointestinal. Abdomen soft and non tender Skin. No rashes Musculoskeletal: no joint deformities     Data Reviewed: I have personally reviewed following labs and imaging studies  CBC: Recent Labs  Lab 09/12/20 1732  09/13/20 0412  WBC 12.4* 12.4*  NEUTROABS 10.4* 10.2*  HGB 14.2 14.3  HCT 44.5 43.9  MCV 93.3 93.4  PLT 295 294   Basic Metabolic Panel: Recent Labs  Lab 09/12/20 1911 09/13/20 0412 09/14/20 0500 09/15/20 0500  NA 128* 131* 131* 133*  K 4.6 4.7 4.1 3.2*  CL 90* 91* 89* 90*  CO2 28 28 29  32  GLUCOSE 107* 91 114* 99  BUN 25* 22 21 27*  CREATININE 1.03* 1.09* 0.85 0.87  CALCIUM 9.2 9.5 9.4 8.8*  MG  --  2.0  --   --    GFR: Estimated Creatinine Clearance: 32.6 mL/min (by C-G formula based on SCr of 0.87 mg/dL). Liver Function Tests: Recent Labs  Lab 09/12/20 1911  AST 48*  ALT 42  ALKPHOS 117  BILITOT 0.8  PROT 5.9*  ALBUMIN 3.2*   No results for input(s): LIPASE, AMYLASE in the last 168 hours. No results for input(s): AMMONIA in the last 168 hours. Coagulation Profile: No results for input(s): INR, PROTIME in the last 168 hours. Cardiac Enzymes: No results for input(s): CKTOTAL, CKMB, CKMBINDEX, TROPONINI in the last 168 hours. BNP (last 3 results) No results for input(s): PROBNP in the last 8760 hours. HbA1C: No results for input(s): HGBA1C in the last 72 hours.  CBG: No results for input(s): GLUCAP in the last 168 hours. Lipid Profile: No results for input(s): CHOL, HDL, LDLCALC, TRIG, CHOLHDL, LDLDIRECT in the last 72 hours. Thyroid Function Tests: No results for input(s): TSH, T4TOTAL, FREET4, T3FREE, THYROIDAB in the last 72 hours. Anemia Panel: No results for input(s): VITAMINB12, FOLATE, FERRITIN, TIBC, IRON, RETICCTPCT in the last 72 hours.    Radiology Studies: I have reviewed all of the imaging during this hospital visit personally     Scheduled Meds: . (feeding supplement) PROSource Plus  30 mL Oral BID BM  . aspirin EC  81 mg Oral Daily  . cholecalciferol  1,000 Units Oral Daily  . enoxaparin (LOVENOX) injection  30 mg Subcutaneous Q24H  . famotidine  20 mg Oral Daily  . feeding supplement  237 mL Oral Q24H  . losartan  25 mg Oral  Daily  . memantine  5 mg Oral BID  . metoprolol tartrate  12.5 mg Oral BID  . OLANZapine  2.5 mg Oral QHS  . pantoprazole  40 mg Oral Daily  . rOPINIRole  1 mg Oral QPM  . rosuvastatin  10 mg Oral QHS  . sodium chloride flush  10-40 mL Intracatheter Q12H   Continuous Infusions: . sodium chloride       LOS: 3 days        Halea Lieb Annett Gula, MD

## 2020-09-15 NOTE — Plan of Care (Signed)
  Problem: Clinical Measurements: Goal: Will remain free from infection Outcome: Progressing Goal: Diagnostic test results will improve Outcome: Progressing Goal: Respiratory complications will improve Outcome: Progressing   Problem: Nutrition: Goal: Adequate nutrition will be maintained Outcome: Progressing   Problem: Skin Integrity: Goal: Risk for impaired skin integrity will decrease Outcome: Progressing   

## 2020-09-16 ENCOUNTER — Inpatient Hospital Stay (HOSPITAL_COMMUNITY): Payer: Medicare Other

## 2020-09-16 DIAGNOSIS — G9341 Metabolic encephalopathy: Secondary | ICD-10-CM | POA: Diagnosis not present

## 2020-09-16 DIAGNOSIS — R0602 Shortness of breath: Secondary | ICD-10-CM | POA: Diagnosis not present

## 2020-09-16 DIAGNOSIS — E782 Mixed hyperlipidemia: Secondary | ICD-10-CM | POA: Diagnosis not present

## 2020-09-16 DIAGNOSIS — I25119 Atherosclerotic heart disease of native coronary artery with unspecified angina pectoris: Secondary | ICD-10-CM | POA: Diagnosis not present

## 2020-09-16 DIAGNOSIS — K219 Gastro-esophageal reflux disease without esophagitis: Secondary | ICD-10-CM | POA: Diagnosis not present

## 2020-09-16 DIAGNOSIS — M2550 Pain in unspecified joint: Secondary | ICD-10-CM | POA: Diagnosis not present

## 2020-09-16 DIAGNOSIS — F0280 Dementia in other diseases classified elsewhere without behavioral disturbance: Secondary | ICD-10-CM | POA: Diagnosis not present

## 2020-09-16 DIAGNOSIS — J90 Pleural effusion, not elsewhere classified: Secondary | ICD-10-CM | POA: Diagnosis not present

## 2020-09-16 DIAGNOSIS — J9601 Acute respiratory failure with hypoxia: Secondary | ICD-10-CM | POA: Diagnosis not present

## 2020-09-16 DIAGNOSIS — F0391 Unspecified dementia with behavioral disturbance: Secondary | ICD-10-CM | POA: Diagnosis not present

## 2020-09-16 DIAGNOSIS — E785 Hyperlipidemia, unspecified: Secondary | ICD-10-CM | POA: Diagnosis not present

## 2020-09-16 DIAGNOSIS — N183 Chronic kidney disease, stage 3 unspecified: Secondary | ICD-10-CM | POA: Diagnosis not present

## 2020-09-16 DIAGNOSIS — R1310 Dysphagia, unspecified: Secondary | ICD-10-CM | POA: Diagnosis not present

## 2020-09-16 DIAGNOSIS — F0390 Unspecified dementia without behavioral disturbance: Secondary | ICD-10-CM | POA: Diagnosis not present

## 2020-09-16 DIAGNOSIS — G2581 Restless legs syndrome: Secondary | ICD-10-CM | POA: Diagnosis not present

## 2020-09-16 DIAGNOSIS — M810 Age-related osteoporosis without current pathological fracture: Secondary | ICD-10-CM | POA: Diagnosis not present

## 2020-09-16 DIAGNOSIS — I2510 Atherosclerotic heart disease of native coronary artery without angina pectoris: Secondary | ICD-10-CM | POA: Diagnosis not present

## 2020-09-16 DIAGNOSIS — I10 Essential (primary) hypertension: Secondary | ICD-10-CM | POA: Diagnosis not present

## 2020-09-16 DIAGNOSIS — I509 Heart failure, unspecified: Secondary | ICD-10-CM | POA: Diagnosis not present

## 2020-09-16 DIAGNOSIS — I6523 Occlusion and stenosis of bilateral carotid arteries: Secondary | ICD-10-CM | POA: Diagnosis not present

## 2020-09-16 DIAGNOSIS — R778 Other specified abnormalities of plasma proteins: Secondary | ICD-10-CM | POA: Diagnosis not present

## 2020-09-16 DIAGNOSIS — E559 Vitamin D deficiency, unspecified: Secondary | ICD-10-CM | POA: Diagnosis not present

## 2020-09-16 DIAGNOSIS — I50810 Right heart failure, unspecified: Secondary | ICD-10-CM | POA: Diagnosis not present

## 2020-09-16 DIAGNOSIS — I150 Renovascular hypertension: Secondary | ICD-10-CM | POA: Diagnosis not present

## 2020-09-16 DIAGNOSIS — R296 Repeated falls: Secondary | ICD-10-CM | POA: Diagnosis not present

## 2020-09-16 DIAGNOSIS — I5033 Acute on chronic diastolic (congestive) heart failure: Secondary | ICD-10-CM | POA: Diagnosis not present

## 2020-09-16 DIAGNOSIS — G309 Alzheimer's disease, unspecified: Secondary | ICD-10-CM | POA: Diagnosis not present

## 2020-09-16 DIAGNOSIS — E876 Hypokalemia: Secondary | ICD-10-CM | POA: Diagnosis not present

## 2020-09-16 DIAGNOSIS — F0150 Vascular dementia without behavioral disturbance: Secondary | ICD-10-CM | POA: Diagnosis not present

## 2020-09-16 DIAGNOSIS — J8410 Pulmonary fibrosis, unspecified: Secondary | ICD-10-CM | POA: Diagnosis not present

## 2020-09-16 DIAGNOSIS — N1831 Chronic kidney disease, stage 3a: Secondary | ICD-10-CM | POA: Diagnosis not present

## 2020-09-16 DIAGNOSIS — E871 Hypo-osmolality and hyponatremia: Secondary | ICD-10-CM | POA: Diagnosis not present

## 2020-09-16 DIAGNOSIS — J9811 Atelectasis: Secondary | ICD-10-CM | POA: Diagnosis not present

## 2020-09-16 DIAGNOSIS — I517 Cardiomegaly: Secondary | ICD-10-CM | POA: Diagnosis not present

## 2020-09-16 DIAGNOSIS — J9690 Respiratory failure, unspecified, unspecified whether with hypoxia or hypercapnia: Secondary | ICD-10-CM | POA: Diagnosis not present

## 2020-09-16 DIAGNOSIS — R569 Unspecified convulsions: Secondary | ICD-10-CM | POA: Diagnosis not present

## 2020-09-16 DIAGNOSIS — I6529 Occlusion and stenosis of unspecified carotid artery: Secondary | ICD-10-CM | POA: Diagnosis not present

## 2020-09-16 DIAGNOSIS — I272 Pulmonary hypertension, unspecified: Secondary | ICD-10-CM | POA: Diagnosis not present

## 2020-09-16 DIAGNOSIS — J9600 Acute respiratory failure, unspecified whether with hypoxia or hypercapnia: Secondary | ICD-10-CM | POA: Diagnosis not present

## 2020-09-16 DIAGNOSIS — Z7401 Bed confinement status: Secondary | ICD-10-CM | POA: Diagnosis not present

## 2020-09-16 DIAGNOSIS — J9611 Chronic respiratory failure with hypoxia: Secondary | ICD-10-CM | POA: Diagnosis not present

## 2020-09-16 DIAGNOSIS — K5901 Slow transit constipation: Secondary | ICD-10-CM | POA: Diagnosis not present

## 2020-09-16 LAB — BASIC METABOLIC PANEL
Anion gap: 9 (ref 5–15)
BUN: 31 mg/dL — ABNORMAL HIGH (ref 8–23)
CO2: 30 mmol/L (ref 22–32)
Calcium: 8.8 mg/dL — ABNORMAL LOW (ref 8.9–10.3)
Chloride: 95 mmol/L — ABNORMAL LOW (ref 98–111)
Creatinine, Ser: 0.84 mg/dL (ref 0.44–1.00)
GFR, Estimated: >60 mL/min (ref >60)
Glucose, Bld: 127 mg/dL — ABNORMAL HIGH (ref 70–99)
Potassium: 4.8 mmol/L (ref 3.5–5.1)
Sodium: 134 mmol/L — ABNORMAL LOW (ref 135–145)

## 2020-09-16 LAB — RESP PANEL BY RT-PCR (FLU A&B, COVID) ARPGX2
Influenza A by PCR: NEGATIVE
Influenza B by PCR: NEGATIVE
SARS Coronavirus 2 by RT PCR: NEGATIVE

## 2020-09-16 LAB — D-DIMER, QUANTITATIVE: D-Dimer, Quant: 0.91 ug/mL-FEU — ABNORMAL HIGH (ref 0.00–0.50)

## 2020-09-16 MED ORDER — IOHEXOL 350 MG/ML SOLN
75.0000 mL | Freq: Once | INTRAVENOUS | Status: AC | PRN
  Administered 2020-09-16: 75 mL via INTRAVENOUS

## 2020-09-16 MED ORDER — FUROSEMIDE 20 MG PO TABS: 20.0000 mg | ORAL_TABLET | Freq: Every day | ORAL | 0 refills | Status: DC | PRN

## 2020-09-16 MED ORDER — FUROSEMIDE 20 MG PO TABS: 20.0000 mg | ORAL_TABLET | Freq: Every day | ORAL | Status: DC | PRN

## 2020-09-16 MED ORDER — ENSURE ENLIVE PO LIQD: 237.0000 mL | ORAL | 0 refills | Status: AC

## 2020-09-16 NOTE — Progress Notes (Signed)
Report called to [facility] to [facility]. [facility] called and covid results faxed to facility.  Val Eagle

## 2020-09-16 NOTE — Discharge Summary (Addendum)
Physician Discharge Summary  Nita Sicklernestine C Nickless ZOX:[id number] DOB: 03/23/1927 DOA: 09/12/2020  PCP: Mahlon GammonGupta, Anjali L, MD  Admit date: 09/12/2020 Discharge date: 09/16/2020  Admitted From: ALF  Disposition:  SNF   Recommendations for Outpatient Follow-up and new medication changes:  1. Follow up with Dr. Chales AbrahamsGupta in 7 days.  2. Added as needed furosemide to edema, dyspnea or weight gain 3 lbs in 24 hrs or 5 lbs in 7 days.   I spoke over the phone with the patient's son about patient's  condition, plan of care, prognosis and all questions were addressed.  Home Health: yes  Equipment/Devices: na    Discharge Condition: stable CODE STATUS: DNR   Diet recommendation:  Heart healthy   Brief/Interim Summary: Mrs. Ohlsonwas admitted to the hospital with a working diagnosis of acute on chronic diastolic heart failure exacerbation (pulmonary hypertension, acute core pulmonale), complicated by acute hypoxic respiratory failure and hyponatremia.  87 year old female past medical history for dementia, coronary artery disease, hypertension and diastolic heart failure. Recently treated for pneumonia. As an outpatient she was treated with Augmentin and supplemental oxygen per nasal cannula, due to persistent symptoms she received 20 mg of furosemide and IM ceftriaxone. Her symptoms continued to deteriorate despite outpatient medical therapy, she had progressive dyspnea and worsening lower extremity edema. On her initial physical examination her oximetry was in the low 90s on 3 L/min of supplemental oxygen per nasal cannula, blood pressure 185/84, heart rate 65-77, respiratory rate 26, temperature 98.6, lungs with no rales or wheezing, heart S1-S2, present, rhythmic, abdomen soft nontender, positive pretibial pitting edema bilaterally.  Sodium 128, potassium 4.6, chloride 90, bicarb 28, glucose 107,BUN 25, creatinine 1.0, BNP 1723, troponin I 41, white count 12.4, hemoglobin 14.2, hematocrit44.5. SARS  COVID-19 negative.  Chest radiograph with positive cardiomegaly, bilateral interstitial infiltrates predominantly at lower lobes with small bilateral pleural effusions.  EKG 65 bpm, rightward axis, first-degree AV block, sinus rhythm, Q-wave V1-V3, no significant ST segment or T wave changes  She responded well to diuresis with furosemide IV, her echocardiogram showed preserved LV systolic function, but severe pulmonary hypertension and decreased RV systolic function.  Elevated troponin due to decompensated heart failure, no features of acute coronary syndrome.  Developed worsening metabolic encephalopathy with features of delirium  1.  Acute on chronic diastolic heart failure exacerbation, right ventricle systolic dysfunction, pulmonary hypertension, complicated with acute hypoxic respiratory failure, cardiogenic pulmonary edema, (acute cor pulmonale). Patient was admitted to the telemetry ward, she received aggressive diuresis with intravenous furosemide, negative fluid balance was achieved with improvement of her symptoms.  Further work-up with echocardiography showed a preserved LV systolic function 60 to 65%, her right ventricle had a systolic dysfunction with increased RV systolic pressure 81.5, consistent with significant pulmonary hypertension.  Positive biatrial enlargement.  D-dimer mildly elevated, she underwent CT angiography which was negative for pulmonary embolism. Positive for bilateral pleural effusions.  Plan to continue furosemide only as needed for signs of hypervolemia.  Patient with pulmonary hypertension, preload dependent.  2.  Hypertension/dyslipidemia.  Continue blood pressure control with losartan and metoprolol. For dyslipidemia on rosuvastatin.  3.  Dementia with a new metabolic encephalopathy with delirium, restless leg syndrome.  Patient developed episodic delirium during her hospitalization, she required antipsychotic therapy with Haldol and  olanzapine. Slowly her symptoms improved, but at time of discharge she is back to her baseline.  Continue memantine and ropinirole.  4.  Chronic kidney disease stage IIIa, hyponatremia/hypokalemia.  Patient tolerated diuresis well, at discharge sodium  134, potassium 4.8, chloride 95, bicarb 30, glucose 127, BUN 31, creatinine 0.4. Follow-up kidney function as an outpatient.  5.  Reactive leukocytosis.  White cell count 12.4, no signs of infection.  Procalcitonin less than 0.10.  No antibiotics were indicated.   Discharge Diagnoses:  Principal Problem:   Acute respiratory failure with hypoxia (HCC) Active Problems:   CAD (coronary artery disease)   Essential hypertension vs Renovascular hbp   Chronic kidney disease, stage 3a (HCC)   Mixed dementia (HCC)   Elevated troponin   Acute on chronic diastolic CHF (congestive heart failure) (HCC)   Hyponatremia    Discharge Instructions   Allergies as of 09/16/2020      Reactions   Cardura [doxazosin Mesylate] Other (See Comments)   Reaction not recalled by the patient   Lotensin [benazepril Hcl] Other (See Comments)   Reaction not recalled by the patient   Tramadol Nausea And Vomiting      Medication List    STOP taking these medications   cefTRIAXone 1 g injection Commonly known as: ROCEPHIN   zinc oxide 20 % ointment     TAKE these medications   acetaminophen 325 MG tablet Commonly known as: TYLENOL Take 650 mg by mouth at bedtime.   aspirin EC 81 MG tablet Take 1 tablet (81 mg total) by mouth daily.   bisacodyl 10 MG suppository Commonly known as: DULCOLAX Place 10 mg rectally daily as needed for moderate constipation.   famotidine 20 MG tablet Commonly known as: PEPCID Take 20 mg by mouth daily.   feeding supplement Liqd Take 237 mLs by mouth daily.   furosemide 20 MG tablet Commonly known as: LASIX Take 1 tablet (20 mg total) by mouth daily as needed for fluid or edema (as needed for leg swelling or  shortness of breath.).   losartan 25 MG tablet Commonly known as: COZAAR Take 25 mg by mouth in the morning and at bedtime.   memantine 5 MG tablet Commonly known as: NAMENDA Take 5 mg by mouth 2 (two) times daily.   metoprolol tartrate 25 MG tablet Commonly known as: LOPRESSOR Take 12.5 mg by mouth 2 (two) times daily.   pantoprazole 40 MG tablet Commonly known as: PROTONIX Take 40 mg by mouth daily.   rOPINIRole 1 MG tablet Commonly known as: REQUIP Take 1 mg by mouth every evening.   rosuvastatin 10 MG tablet Commonly known as: CRESTOR Take 10 mg by mouth at bedtime.   Vitamin D3 25 MCG tablet Commonly known as: Vitamin D Take 1,000 Units by mouth daily.       Allergies  Allergen Reactions  . Cardura [Doxazosin Mesylate] Other (See Comments)    Reaction not recalled by the patient  . Lotensin [Benazepril Hcl] Other (See Comments)    Reaction not recalled by the patient  . Tramadol Nausea And Vomiting        Procedures/Studies: DG Chest Port 1 View  Result Date: 09/12/2020 CLINICAL DATA:  Worsening shortness of breath.  Hypoxia. EXAM: PORTABLE CHEST 1 VIEW COMPARISON:  06/26/2020 FINDINGS: Heart size is stable. New airspace opacities are seen in the peripheral lower lung zones bilaterally, suspicious for atypical infection or less likely edema. IMPRESSION: New bilateral peripheral lower lung airspace opacities, suspicious for atypical infection or less likely edema. Electronically Signed   By: Danae Orleans M.D.   On: 09/12/2020 18:12   ECHOCARDIOGRAM COMPLETE  Result Date: 09/13/2020    ECHOCARDIOGRAM REPORT   Patient Name:   [name]  Nosal Date of Exam: 09/13/2020 Medical Rec #:  [id number]          Height:       62.0 in Accession #:    [id number]         Weight:       120.3 lb Date of Birth:  08-28-1926           BSA:          1.540 m Patient Age:    87 years           BP:           169/76 mmHg Patient Gender: F                  HR:           87 bpm. Exam  Location:  Inpatient Procedure: 2D Echo, Cardiac Doppler and Color Doppler Indications:    I50.33 Acute on chronic diastolic (congestive) heart failure  History:        Patient has prior history of Echocardiogram examinations, most                 recent 08/09/2012. CAD, Carotid Disease; Risk                 Factors:Hypertension.  Sonographer:    Elmarie Shiley Dance Referring Phys: [id number] TIMOTHY S OPYD IMPRESSIONS  1. Left ventricular ejection fraction, by estimation, is 60 to 65%. The left ventricle has normal function. The left ventricle has no regional wall motion abnormalities. Left ventricular diastolic parameters are indeterminate. There is the interventricular septum is flattened in systole and diastole, consistent with right ventricular pressure and volume overload.  2. Right ventricular systolic function is moderately reduced. The right ventricular size is moderately enlarged. There is severely elevated pulmonary artery systolic pressure. The estimated right ventricular systolic pressure is 81.5 mmHg.  3. Left atrial size was severely dilated.  4. Right atrial size was severely dilated.  5. The pericardial effusion is circumferential.  6. The mitral valve is normal in structure. Mild to moderate mitral valve regurgitation. No evidence of mitral stenosis. There is moderate holosystolic prolapse of the middle segment of the anterior leaflet of the mitral valve.  7. Tricuspid valve regurgitation is moderate.  8. The aortic valve is normal in structure. There is mild calcification of the aortic valve. There is mild thickening of the aortic valve. Aortic valve regurgitation is not visualized. Mild to moderate aortic valve sclerosis/calcification is present, without any evidence of aortic stenosis.  9. The inferior vena cava is normal in size with greater than 50% respiratory variability, suggesting right atrial pressure of 3 mmHg. FINDINGS  Left Ventricle: Left ventricular ejection fraction, by estimation, is 60 to  65%. The left ventricle has normal function. The left ventricle has no regional wall motion abnormalities. The left ventricular internal cavity size was normal in size. There is  no left ventricular hypertrophy. The interventricular septum is flattened in systole and diastole, consistent with right ventricular pressure and volume overload. Left ventricular diastolic parameters are indeterminate. Right Ventricle: The right ventricular size is moderately enlarged. No increase in right ventricular wall thickness. Right ventricular systolic function is moderately reduced. There is severely elevated pulmonary artery systolic pressure. The tricuspid regurgitant velocity is 4.43 m/s, and with an assumed right atrial pressure of 3 mmHg, the estimated right ventricular systolic pressure is 81.5 mmHg. Left Atrium: Left atrial size was severely dilated. Right Atrium: Right atrial size was severely dilated. Pericardium:  Trivial pericardial effusion is present. The pericardial effusion is circumferential. Mitral Valve: The mitral valve is normal in structure. There is moderate holosystolic prolapse of the middle segment of the anterior leaflet of the mitral valve. There is mild thickening of the mitral valve leaflet(s). Mild to moderate mitral valve regurgitation. No evidence of mitral valve stenosis. Tricuspid Valve: The tricuspid valve is normal in structure. Tricuspid valve regurgitation is moderate . No evidence of tricuspid stenosis. Aortic Valve: The aortic valve is normal in structure. There is mild calcification of the aortic valve. There is mild thickening of the aortic valve. Aortic valve regurgitation is not visualized. Aortic regurgitation PHT measures 402 msec. Mild to moderate aortic valve sclerosis/calcification is present, without any evidence of aortic stenosis. Pulmonic Valve: The pulmonic valve was normal in structure. Pulmonic valve regurgitation is not visualized. No evidence of pulmonic stenosis. Aorta: The  aortic root is normal in size and structure. Venous: The inferior vena cava is normal in size with greater than 50% respiratory variability, suggesting right atrial pressure of 3 mmHg. IAS/Shunts: No atrial level shunt detected by color flow Doppler.  LEFT VENTRICLE PLAX 2D LVIDd:         3.45 cm  Diastology LVIDs:         2.00 cm  LV e' medial:    4.13 cm/s LV PW:         1.25 cm  LV E/e' medial:  25.7 LV IVS:        0.95 cm  LV e' lateral:   5.87 cm/s LVOT diam:     1.50 cm  LV E/e' lateral: 18.1 LV SV:         22 LV SV Index:   14 LVOT Area:     1.77 cm  RIGHT VENTRICLE            IVC RV Basal diam:  3.50 cm    IVC diam: 1.80 cm RV Mid diam:    2.80 cm RV S prime:     6.74 cm/s TAPSE (M-mode): 0.8 cm LEFT ATRIUM             Index       RIGHT ATRIUM           Index LA diam:        3.70 cm 2.40 cm/m  RA Area:     23.80 cm LA Vol (A2C):   93.9 ml 60.96 ml/m RA Volume:   79.60 ml  51.68 ml/m LA Vol (A4C):   40.4 ml 26.23 ml/m LA Biplane Vol: 62.3 ml 40.45 ml/m  AORTIC VALVE LVOT Vmax:   61.40 cm/s LVOT Vmean:  46.000 cm/s LVOT VTI:    0.126 m AI PHT:      402 msec  AORTA Ao Root diam: 3.00 cm Ao Asc diam:  2.50 cm MITRAL VALVE                TRICUSPID VALVE MV Area (PHT): 4.49 cm     TR Peak grad:   78.5 mmHg MV Decel Time: 169 msec     TR Vmax:        443.00 cm/s MV E velocity: 106.00 cm/s MV A velocity: 47.40 cm/s   SHUNTS MV E/A ratio:  2.24         Systemic VTI:  0.13 m  Systemic Diam: 1.50 cm Donato Schultz MD Electronically signed by Donato Schultz MD Signature Date/Time: 09/13/2020/3:22:13 PM    Final        Subjective: Patient is feeling better, this am is out of bed to the chair, awake and alert. No agitation and confusion at her baseline.   Discharge Exam: Vitals:   09/15/20 2130 09/16/20 0624  BP:  105/80  Pulse: 79 82  Resp: 18 16  Temp: 97.8 F (36.6 C) 97.6 F (36.4 C)  SpO2: (!) 85% 95%   Vitals:   09/15/20 0500 09/15/20 1308 09/15/20 2130 09/16/20 0624   BP:  94/74  105/80  Pulse:  69 79 82  Resp:  Temp:  97.8 F (36.6 C) 97.8 F (36.6 C) 97.6 F (36.4 C)  TempSrc:  Oral Oral Oral  SpO2:  93% (!) 85% 95%  Weight: 50.2 kg   50.7 kg  Height:        General: Not in pain or dyspnea Neurology: Awake and alert, non focal  E ENT: no pallor, no icterus, oral mucosa moist Cardiovascular: No JVD. S1-S2 present, rhythmic, no gallops, rubs, or murmurs. No lower extremity edema. Pulmonary: positive breath sounds bilaterally, with no wheezing, rhonchi or rales. Gastrointestinal. Abdomen soft and non tender Skin. No rashes Musculoskeletal: no joint deformities   The results of significant diagnostics from this hospitalization (including imaging, microbiology, ancillary and laboratory) are listed below for reference.     Microbiology: Recent Results (from the past 240 hour(s))  SARS CORONAVIRUS 2 (TAT 6-24 HRS) Nasopharyngeal Nasopharyngeal Swab     Status: None   Collection Time: 09/12/20  8:00 PM   Specimen: Nasopharyngeal Swab  Result Value Ref Range Status   SARS Coronavirus 2 NEGATIVE NEGATIVE Final    Comment: (NOTE) SARS-CoV-2 target nucleic acids are NOT DETECTED.  The SARS-CoV-2 RNA is generally detectable in upper and lower respiratory specimens during the acute phase of infection. Negative results do not preclude SARS-CoV-2 infection, do not rule out co-infections with other pathogens, and should not be used as the sole basis for treatment or other patient management decisions. Negative results must be combined with clinical observations, patient history, and epidemiological information. The expected result is Negative.  Fact Sheet for Patients: HairSlick.no  Fact Sheet for Healthcare Providers: quierodirigir.com  This test is not yet approved or cleared by the [place] FDA and  has been authorized for detection and/or diagnosis of SARS-CoV-2 by FDA  under an Emergency Use Authorization (EUA). This EUA will remain  in effect (meaning this test can be used) for the duration of the COVID-19 declaration under Se ction 564(b)(1) of the Act, 21 U.S.C. section 360bbb-3(b)(1), unless the authorization is terminated or revoked sooner.  Performed at [facility], [address], [place], [place] [address]   MRSA PCR Screening     Status: None   Collection Time: 09/13/20 12:50 PM   Specimen: Nasopharyngeal  Result Value Ref Range Status   MRSA by PCR NEGATIVE NEGATIVE Final    Comment:        The GeneXpert MRSA Assay (FDA approved for NASAL specimens only), is one component of a comprehensive MRSA colonization surveillance program. It is not intended to diagnose MRSA infection nor to guide or monitor treatment for MRSA infections. Performed at [facility], [address], [place], [place] [address]      Labs: BNP (last 3 results) Recent Labs    09/12/20 1732  BNP  1,723.6*   Basic Metabolic Panel: Recent Labs  Lab 09/12/20 1911 09/13/20 0412 09/14/20 0500 09/15/20 0500 09/16/20 0412  NA 128* 131* 131* 133* 134*  K 4.6 4.7 4.1 3.2* 4.8  CL 90* 91* 89* 90* 95*  CO2 28 28 29  32 30  GLUCOSE 107* 91 114* 99 127*  BUN 25* 22 21 27* 31*  CREATININE 1.03* 1.09* 0.85 0.87 0.84  CALCIUM 9.2 9.5 9.4 8.8* 8.8*  MG  --  2.0  --   --   --    Liver Function Tests: Recent Labs  Lab 09/12/20 1911  AST 48*  ALT 42  ALKPHOS 117  BILITOT 0.8  PROT 5.9*  ALBUMIN 3.2*   No results for input(s): LIPASE, AMYLASE in the last 168 hours. No results for input(s): AMMONIA in the last 168 hours. CBC: Recent Labs  Lab 09/12/20 1732 09/13/20 0412  WBC 12.4* 12.4*  NEUTROABS 10.4* 10.2*  HGB 14.2 14.3  HCT 44.5 43.9  MCV 93.3 93.4  PLT 295 294   Cardiac Enzymes: No results for input(s): CKTOTAL, CKMB, CKMBINDEX, TROPONINI in the last 168 hours. BNP: Invalid input(s): POCBNP CBG: No results for  input(s): GLUCAP in the last 168 hours. D-Dimer No results for input(s): DDIMER in the last 72 hours. Hgb A1c No results for input(s): HGBA1C in the last 72 hours. Lipid Profile No results for input(s): CHOL, HDL, LDLCALC, TRIG, CHOLHDL, LDLDIRECT in the last 72 hours. Thyroid function studies No results for input(s): TSH, T4TOTAL, T3FREE, THYROIDAB in the last 72 hours.  Invalid input(s): FREET3 Anemia work up No results for input(s): VITAMINB12, FOLATE, FERRITIN, TIBC, IRON, RETICCTPCT in the last 72 hours. Urinalysis    Component Value Date/Time   COLORURINE YELLOW 06/26/2020 1519   APPEARANCEUR CLEAR 06/26/2020 1519   LABSPEC 1.012 06/26/2020 1519   PHURINE 6.0 06/26/2020 1519   GLUCOSEU NEGATIVE 06/26/2020 1519   HGBUR SMALL (A) 06/26/2020 1519   BILIRUBINUR NEGATIVE 06/26/2020 1519   KETONESUR 5 (A) 06/26/2020 1519   PROTEINUR NEGATIVE 06/26/2020 1519   UROBILINOGEN 0.2 09/24/2014 1351   NITRITE NEGATIVE 06/26/2020 1519   LEUKOCYTESUR SMALL (A) 06/26/2020 1519   Sepsis Labs Invalid input(s): PROCALCITONIN,  WBC,  LACTICIDVEN Microbiology Recent Results (from the past 240 hour(s))  SARS CORONAVIRUS 2 (TAT 6-24 HRS) Nasopharyngeal Nasopharyngeal Swab     Status: None   Collection Time: 09/12/20  8:00 PM   Specimen: Nasopharyngeal Swab  Result Value Ref Range Status   SARS Coronavirus 2 NEGATIVE NEGATIVE Final    Comment: (NOTE) SARS-CoV-2 target nucleic acids are NOT DETECTED.  The SARS-CoV-2 RNA is generally detectable in upper and lower respiratory specimens during the acute phase of infection. Negative results do not preclude SARS-CoV-2 infection, do not rule out co-infections with other pathogens, and should not be used as the sole basis for treatment or other patient management decisions. Negative results must be combined with clinical observations, patient history, and epidemiological information. The expected result is Negative.  Fact Sheet for  Patients: 09/14/20  Fact Sheet for Healthcare Providers: HairSlick.no  This test is not yet approved or cleared by the [place] FDA and  has been authorized for detection and/or diagnosis of SARS-CoV-2 by FDA under an Emergency Use Authorization (EUA). This EUA will remain  in effect (meaning this test can be used) for the duration of the COVID-19 declaration under Se ction 564(b)(1) of the Act, 21 U.S.C. section 360bbb-3(b)(1), unless the authorization is terminated or revoked sooner.  Performed at  [facility], [address], [place], [place] [address]   MRSA PCR Screening     Status: None   Collection Time: 09/13/20 12:50 PM   Specimen: Nasopharyngeal  Result Value Ref Range Status   MRSA by PCR NEGATIVE NEGATIVE Final    Comment:        The GeneXpert MRSA Assay (FDA approved for NASAL specimens only), is one component of a comprehensive MRSA colonization surveillance program. It is not intended to diagnose MRSA infection nor to guide or monitor treatment for MRSA infections. Performed at [facility], [address], [place], [place] [address]      Time coordinating discharge: 45 minutes  SIGNED:   Coralie Keens, MD  [facility] 09/16/2020, 8:29 AM

## 2020-09-16 NOTE — Care Management Important Message (Signed)
Medicare IM printed for Social work team at [facility] to give to the patient. °

## 2020-09-16 NOTE — Progress Notes (Signed)
Patient leaving to return to [facility].  Midline LUA removed and dressing applied.  Skin tears to LLE posterior and RLE anterior cleansed and new foam dressings applied as well as sacral foam applied for preventative measures.  Patient cleaned and kotex with mesh panties on for transport,, no BM noted.  Personal belongings black and white striped top and black pants in bag.  No c/o pain voiced and no acute distress noted prior to leaving [facility] via stretcher.

## 2020-09-16 NOTE — TOC Progression Note (Signed)
Transition of Care [facility]) - Progression Note    Patient Details  Name: [name] MRN: [id number] Date of Birth: 05-Jul-1927  [facility] CM/SW Contact  Geni Bers, RN Phone Number: 09/16/2020, 4:12 PM  Clinical Narrative:     Waiting COVID test       Expected Discharge Plan and Services           Expected Discharge Date: 09/16/20                                     Social Determinants of Health (SDOH) Interventions    Readmission Risk Interventions No flowsheet data found.

## 2020-09-16 NOTE — [place] FL2 (Signed)
[place] MEDICAID FL2 LEVEL OF CARE SCREENING TOOL     IDENTIFICATION  Patient Name: [name] Birthdate: 1926/11/16 Sex: female Admission Date (Current Location): 09/12/2020  [facility] and [place] Number:  Producer, television/film/video and Address:  [facility],  [address], [place] [address]      Provider Number: [id number]  Attending Physician Name and Address:  Coralie Keens  Relative Name and Phone Number:       Current Level of Care: Hospital Recommended Level of Care: Skilled Nursing Facility Prior Approval Number:    Date Approved/Denied:   PASRR Number: [id number]  Discharge Plan: SNF    Current Diagnoses: Patient Active Problem List   Diagnosis Date Noted  . Acute respiratory failure with hypoxia (HCC) 09/12/2020  . Elevated troponin 09/12/2020  . Acute on chronic diastolic CHF (congestive heart failure) (HCC) 09/12/2020  . Hyponatremia 09/12/2020  . Generalized weakness 09/09/2020  . Peripheral edema 09/09/2020  . Pneumonia of left lower lobe due to infectious organism 08/26/2020  . Frequent falls 08/26/2020  . Hypoxia 08/23/2020  . Insomnia 08/23/2020  . Dysphagia 07/01/2020  . New onset seizure without head trauma (HCC) 06/26/2020  . Slow transit constipation 05/31/2020  . Lower back pain 04/22/2020  . Vitamin D deficiency 02/23/2020  . Restless leg syndrome 02/23/2020  . Mixed dementia (HCC) 02/23/2020  . Osteoporosis 02/23/2020  . GERD (gastroesophageal reflux disease) 02/23/2020  . Chronic kidney disease, stage 3a (HCC) 12/29/2019  . Palpitations   . Chest tightness   . Coronary artery disease   . Carotid arterial disease (HCC)   . Renovascular hypertension 04/17/2016  . Essential hypertension vs Renovascular hbp 12/26/2014  . Postinflammatory pulmonary fibrosis (HCC) 12/25/2014  . Tachycardia 12/13/2014  . Aftercare following surgery of the circulatory system, NEC 11/28/2013  . CAD (coronary artery disease)  09/21/2013  . Right renal artery stenosis (HCC) 09/21/2013  . Hyperlipidemia 09/21/2013  . First degree AV block 09/21/2013  . Carotid stenosis 11/24/2011    Orientation RESPIRATION BLADDER Height & Weight     Self  O2 (3L O2 [place]) Incontinent,External catheter Weight: 50.7 kg Height:  5\' 2"  (157.5 cm)  BEHAVIORAL SYMPTOMS/MOOD NEUROLOGICAL BOWEL NUTRITION STATUS      Continent Diet (2g Sodium)  AMBULATORY STATUS COMMUNICATION OF NEEDS Skin   Limited Assist Verbally Normal                       Personal Care Assistance Level of Assistance  Bathing,Feeding,Dressing Bathing Assistance: Limited assistance Feeding assistance: Independent Dressing Assistance: Limited assistance     Functional Limitations Info  Sight,Hearing,Speech Sight Info: Adequate Hearing Info: Adequate Speech Info: Adequate    SPECIAL CARE FACTORS FREQUENCY  PT (By licensed PT),OT (By licensed OT)     PT Frequency: 5x week OT Frequency: 5x week            Contractures Contractures Info: Not present    Additional Factors Info  Code Status,Allergies Code Status Info: DNR Allergies Info: Cardura (Doxazosin Mesylate), Lotensin (Benazepril Hcl), Tramadol           Current Medications (09/16/2020):  This is the current hospital active medication list Current Facility-Administered Medications  Medication Dose Route Frequency Provider Last Rate Last Admin  . (feeding supplement) PROSource Plus liquid 30 mL  30 mL Oral BID BM Arrien, 09/18/2020, MD   30 mL at 09/16/20 1201  . 0.9 %  sodium chloride infusion  250 mL Intravenous PRN  Briscoe Deutscher, MD      . acetaminophen (TYLENOL) tablet 650 mg  650 mg Oral Q4H PRN Opyd, Lavone Neri, MD   650 mg at 09/13/20 0110  . aspirin EC tablet 81 mg  81 mg Oral Daily Arrien, York Ram, MD   81 mg at 09/16/20 1159  . bisacodyl (DULCOLAX) suppository 10 mg  10 mg Rectal Daily PRN Arrien, York Ram, MD      . cholecalciferol (VITAMIN D) tablet  1,000 Units  1,000 Units Oral Daily Coralie Keens, MD   1,000 Units at 09/16/20 1159  . enoxaparin (LOVENOX) injection 30 mg  30 mg Subcutaneous Q24H Opyd, Lavone Neri, MD   30 mg at 09/15/20 2059  . famotidine (PEPCID) tablet 20 mg  20 mg Oral Daily Arrien, York Ram, MD   20 mg at 09/16/20 0848  . feeding supplement (ENSURE ENLIVE / ENSURE PLUS) liquid 237 mL  237 mL Oral Q24H Arrien, York Ram, MD   237 mL at 09/15/20 1748  . furosemide (LASIX) tablet 20 mg  20 mg Oral Daily PRN Arrien, York Ram, MD      . haloperidol (HALDOL) tablet 1 mg  1 mg Oral Q4H PRN Arrien, York Ram, MD   1 mg at 09/14/20 0244   Or  . haloperidol lactate (HALDOL) injection 1 mg  1 mg Intramuscular Q4H PRN Coralie Keens, MD   1 mg at 09/13/20 1227  . losartan (COZAAR) tablet 25 mg  25 mg Oral Daily Arrien, York Ram, MD   25 mg at 09/16/20 0848  . memantine [facility]) tablet 5 mg  5 mg Oral BID Arrien, York Ram, MD   5 mg at 09/16/20 1159  . metoprolol tartrate (LOPRESSOR) tablet 12.5 mg  12.5 mg Oral BID Coralie Keens, MD   12.5 mg at 09/16/20 0848  . OLANZapine (ZYPREXA) tablet 2.5 mg  2.5 mg Oral QHS Arrien, York Ram, MD   2.5 mg at 09/15/20 2059  . ondansetron (ZOFRAN) injection 4 mg  4 mg Intravenous Q6H PRN Opyd, Lavone Neri, MD      . pantoprazole (PROTONIX) EC tablet 40 mg  40 mg Oral Daily Arrien, York Ram, MD   40 mg at 09/16/20 0848  . rOPINIRole (REQUIP) tablet 1 mg  1 mg Oral QPM Arrien, York Ram, MD   1 mg at 09/15/20 1748  . rosuvastatin (CRESTOR) tablet 10 mg  10 mg Oral QHS Arrien, York Ram, MD   10 mg at 09/15/20 2059  . sodium chloride flush (NS) 0.9 % injection 10-40 mL  10-40 mL Intracatheter Q12H Arrien, York Ram, MD   10 mL at 09/15/20 1000  . sodium chloride flush (NS) 0.9 % injection 10-40 mL  10-40 mL Intracatheter PRN Arrien, York Ram, MD         Discharge Medications: Please see  discharge summary for a list of discharge medications.  Relevant Imaging Results:  Relevant Lab Results:   Additional Information [id number]  Geni Bers, RN

## 2020-09-16 NOTE — Plan of Care (Signed)
  Problem: Clinical Measurements: Goal: Will remain free from infection Outcome: Progressing Goal: Diagnostic test results will improve Outcome: Progressing Goal: Respiratory complications will improve Outcome: Progressing   Problem: Nutrition: Goal: Adequate nutrition will be maintained Outcome: Progressing   Problem: Skin Integrity: Goal: Risk for impaired skin integrity will decrease Outcome: Progressing   

## 2020-09-16 NOTE — TOC Progression Note (Signed)
Transition of Care [facility]) - Progression Note    Patient Details  Name: [name] MRN: [id number] Date of Birth: Feb 03, 1927  [facility]) CM/SW Contact  Geni Bers, RN Phone Number: 09/16/2020, 1:32 PM  Clinical Narrative:     [facility] Admission Coordinator called to state pt would benefit from SNF related to care is too much for ALF.  FL2 completed and faxed to [facility]. Pt's son [name] was called and agreed with pt going to SNF. Admission Coordinator at [facility] SNF agreed with pt coming to SNF.        Expected Discharge Plan and Services           Expected Discharge Date: 09/16/20                                     Social Determinants of Health (SDOH) Interventions    Readmission Risk Interventions No flowsheet data found.

## 2020-09-17 ENCOUNTER — Non-Acute Institutional Stay (SKILLED_NURSING_FACILITY): Payer: Medicare Other | Admitting: Nurse Practitioner

## 2020-09-17 ENCOUNTER — Encounter: Payer: Self-pay | Admitting: Nurse Practitioner

## 2020-09-17 DIAGNOSIS — K5901 Slow transit constipation: Secondary | ICD-10-CM

## 2020-09-17 DIAGNOSIS — F0280 Dementia in other diseases classified elsewhere without behavioral disturbance: Secondary | ICD-10-CM

## 2020-09-17 DIAGNOSIS — I5033 Acute on chronic diastolic (congestive) heart failure: Secondary | ICD-10-CM | POA: Diagnosis not present

## 2020-09-17 DIAGNOSIS — N1831 Chronic kidney disease, stage 3a: Secondary | ICD-10-CM | POA: Diagnosis not present

## 2020-09-17 DIAGNOSIS — I6523 Occlusion and stenosis of bilateral carotid arteries: Secondary | ICD-10-CM

## 2020-09-17 DIAGNOSIS — I2510 Atherosclerotic heart disease of native coronary artery without angina pectoris: Secondary | ICD-10-CM | POA: Diagnosis not present

## 2020-09-17 DIAGNOSIS — G2581 Restless legs syndrome: Secondary | ICD-10-CM

## 2020-09-17 DIAGNOSIS — R296 Repeated falls: Secondary | ICD-10-CM

## 2020-09-17 DIAGNOSIS — G309 Alzheimer's disease, unspecified: Secondary | ICD-10-CM

## 2020-09-17 DIAGNOSIS — K219 Gastro-esophageal reflux disease without esophagitis: Secondary | ICD-10-CM | POA: Diagnosis not present

## 2020-09-17 DIAGNOSIS — I10 Essential (primary) hypertension: Secondary | ICD-10-CM | POA: Diagnosis not present

## 2020-09-17 DIAGNOSIS — J8410 Pulmonary fibrosis, unspecified: Secondary | ICD-10-CM

## 2020-09-17 DIAGNOSIS — E782 Mixed hyperlipidemia: Secondary | ICD-10-CM

## 2020-09-17 DIAGNOSIS — M5440 Lumbago with sciatica, unspecified side: Secondary | ICD-10-CM

## 2020-09-17 DIAGNOSIS — R569 Unspecified convulsions: Secondary | ICD-10-CM

## 2020-09-17 DIAGNOSIS — R1310 Dysphagia, unspecified: Secondary | ICD-10-CM

## 2020-09-17 DIAGNOSIS — F0150 Vascular dementia without behavioral disturbance: Secondary | ICD-10-CM

## 2020-09-17 NOTE — Assessment & Plan Note (Signed)
Bun/creat31/0.84 09/16/20

## 2020-09-17 NOTE — Assessment & Plan Note (Signed)
general, takes Tylenol 650mg qhs.  

## 2020-09-17 NOTE — Assessment & Plan Note (Signed)
Carotidarterystenosis US carotid artery: <40% internal carotid artery stenosis bilaterally, s/p endarterectomy.

## 2020-09-17 NOTE — Assessment & Plan Note (Signed)
takes prn Bisacodyl suppository

## 2020-09-17 NOTE — Assessment & Plan Note (Addendum)
Renovascular HTN, mild elevated Sbp, takingMetoprolol, Losartan, Bun/creat31/0.84 09/16/20

## 2020-09-17 NOTE — Assessment & Plan Note (Signed)
takes Rosuvastatin, LDL 70 12/25/19  

## 2020-09-17 NOTE — Assessment & Plan Note (Signed)
regulardiet

## 2020-09-17 NOTE — Assessment & Plan Note (Signed)
onset 06/26/20, negative CT, EEG, MRI, may consider Keppra if recurs, f/u Neurology

## 2020-09-17 NOTE — Assessment & Plan Note (Signed)
CAD, PVD, takes ASA, Rosuvastatin. EKG 08/24/20 no significant ST-T changes. SR vent rate 84 bpm

## 2020-09-17 NOTE — Assessment & Plan Note (Signed)
Postinflammatory pulmonary fibrosis,Hypoxia

## 2020-09-17 NOTE — Progress Notes (Signed)
Location:    [facility] Room Number: 2 Place of Service:  SNF (31) Provider: Arna Snipe Darcia Lampi NP  Mahlon Gammon, MD  Patient Care Team: Mahlon Gammon, MD as PCP - General (Internal Medicine) Susa Griffins, MD (Inactive) (Cardiology)  Extended Emergency Contact Information Primary Emergency Contact: [name] Home Phone: [phone] Relation: Son Secondary Emergency Contact: [name] States of [place] Home Phone: [phone] Relation: Friend  Code Status:  DNR Goals of care: Advanced Directive information Advanced Directives 09/12/2020  Does Patient Have a Medical Advance Directive? Yes  Type of Estate agent of [place];Living will;Out of facility DNR (pink MOST or yellow form)  Does patient want to make changes to medical advance directive? No - Patient declined  Copy of Healthcare Power of Attorney in Chart? No - copy requested  Would patient like information on creating a medical advance directive? -  Pre-existing out of facility DNR order (yellow form or pink MOST form) -     Chief Complaint  Patient presents with  . Acute Visit    Medication review    HPI:  Pt is a 87 y.o. female seen today for an acute visit for review medications.   Hospitalized 09/12/20-09/16/20 for acute on chronic diastolic heart failure, complicated with acute hypoxic respiratory failure, cardiogentic pulmonary edema, received aggressive diuresis with IV Furosemide. CTA was negative for PE. On prn Furosemide for wt gain 3Ibs/24 hours or 5Ibs/wk. EF 60-65%, significant pulmonary hypertension.   Pneumonia, left lower lung, CXR 08/24/20 pulmonary infiltrate left lung base, completed 7 day course of Augmentin ,CXR 08/19/20 chronic interstitial lund disease),persisted O2 desaturation             BLE edema, prn Furosemide. Venous US BLE 08/23/20 negative for DVT. Recurrent falls, increased weakness, lack of safety awareness  are contributory. Dementia, takes Memantine.No behaviors.the patient needs close supervision for safety due to her decreased safety awareness. Renovascular HTN,takingMetoprolol, Losartan, Bun/creat31/0.84 09/16/20 Carotidarterystenosis US carotid artery: <40% internal carotid artery stenosis bilaterally, s/p endarterectomy.  CAD, PVD, takes ASA, Rosuvastatin. EKG 08/24/20 no significant ST-T changes. SR vent rate 84 bpm Hyperlipidemia, takes Rosuvastatin, LDL 70 12/25/19 Postinflammatory pulmonary fibrosis,Hypoxia             CKD Bun/creat31/0.84 09/16/20 Dysphagia,regular diet. Constipation, takes prn Bisacodyl suppository GERD, takes Pantoprazole, Famotidine, Hgb 14.3 09/13/20 OA, general, takes Tylenol  qhs.  RLS, takes Requip Seizure: onset 06/26/20, negative CT, EEG, MRI, may consider Keppra if recurs, f/u Neurology     Past Medical History:  Diagnosis Date  . Anemia   . Carotid arterial disease (HCC)    asymptomatic   . Chest tightness   . Coronary artery disease   . High blood pressure   . Leg pain    with walking  . Palpitations    Past Surgical History:  Procedure Laterality Date  . CARDIAC CATHETERIZATION  07/15/04   noncritical CAD,80% PLA treated medically. EF% 60. no RAS.  [name] CAROTID ENDARTERECTOMY     RIGHT=05/11/11, LEFT=03/30/11  . EYE SURGERY  2011   Cataract surgery Bilateral  . EYE SURGERY  2011   Bilateral eyelid surgery for ptosis  . renal artery duplex  02/23/13   ABDOMINAL AORTA:<50% DIAMETER REDUCTION. RIGHT RENAL ARTERY: 60-99% DIAMETER REDUCTION.[name] LEFT RENAL ARTERY:1-59% DIAMETER REDUCTION.    Allergies  Allergen Reactions  . Cardura [Doxazosin Mesylate] Other (See Comments)    Reaction not recalled by the patient  . Lotensin [Benazepril Hcl] Other (See Comments)  Reaction not recalled by the patient  . Tramadol Nausea And Vomiting    Allergies as of 09/17/2020      Reactions   Cardura [doxazosin Mesylate] Other (See Comments)   Reaction not recalled by the patient   Lotensin [benazepril Hcl] Other (See Comments)   Reaction not recalled by the patient   Tramadol Nausea And Vomiting      Medication List       Accurate as of September 17, 2020  3:47 PM. If you have any questions, ask your nurse or doctor.        STOP taking these medications   furosemide 20 MG tablet Commonly known as: LASIX Stopped by: Taris Galindo X Sonna Lipsky, NP     TAKE these medications   acetaminophen 325 MG tablet Commonly known as: TYLENOL Take 650 mg by mouth at bedtime.   aspirin EC 81 MG tablet Take 1 tablet (81 mg total) by mouth daily.   bisacodyl 10 MG suppository Commonly known as: DULCOLAX Place 10 mg rectally daily as needed for moderate constipation.   famotidine 20 MG tablet Commonly known as: PEPCID Take 20 mg by mouth daily.   feeding supplement Liqd Take 237 mLs by mouth daily.   losartan 25 MG tablet Commonly known as: COZAAR Take 25 mg by mouth in the morning and at bedtime.   memantine 5 MG tablet Commonly known as: NAMENDA Take 5 mg by mouth 2 (two) times daily.   metoprolol tartrate 25 MG tablet Commonly known as: LOPRESSOR Take 12.5 mg by mouth 2 (two) times daily.   pantoprazole 40 MG tablet Commonly known as: PROTONIX Take 40 mg by mouth daily.   rOPINIRole 1 MG tablet Commonly known as: REQUIP Take 1 mg by mouth every evening.   rosuvastatin 10 MG tablet Commonly known as: CRESTOR Take 10 mg by mouth at bedtime.   Vitamin D3 25 MCG tablet Commonly known as: Vitamin D Take 1,000 Units by mouth daily.   zinc oxide 20 % ointment Apply 1 application topically as needed for irritation.       Review of Systems  Constitutional: Negative for fatigue, fever and unexpected weight change.       Generalized weakness, needs assistance  and supervision for transfer, ambulation with walker.   HENT: Positive for hearing loss. Negative for congestion and trouble swallowing.   Eyes: Negative for visual disturbance.  Respiratory: Positive for shortness of breath. Negative for cough, chest tightness and wheezing.        DOE at her baseline.   Cardiovascular: Positive for leg swelling. Negative for chest pain and palpitations.  Gastrointestinal: Negative for abdominal pain and constipation.  Genitourinary: Negative for dysuria, frequency and urgency.  Musculoskeletal: Positive for back pain and gait problem.       Uses walker.   Skin: Negative for color change.  Neurological: Negative for seizures, speech difficulty, weakness and light-headedness.  Psychiatric/Behavioral: Positive for confusion. Negative for agitation, behavioral problems and sleep disturbance. The patient is not nervous/anxious.     Immunization History  Administered Date(s) Administered  . Influenza Split 04/19/2014, 04/20/2015  . Influenza Whole 04/19/2016  . Influenza, High Dose Seasonal PF 03/18/2015, 04/27/2016, 04/28/2017  . Influenza, Quadrivalent, Recombinant, Inj, Pf 04/14/2018, 05/03/2019  . Influenza,inj,Quad PF,6+ Mos 04/14/2018  . Influenza-Unspecified 04/23/2020  . Moderna Sars-Covid-2 Vaccination 07/24/2019, 08/21/2019, 06/03/2020  . Pneumococcal Conjugate-13 08/23/2014  . Pneumococcal Polysaccharide-23 06/02/2002, 06/03/2011  . Tdap 09/21/2017  . Zoster 06/02/2001   Pertinent  Health Maintenance Due  Topic Date Due  . INFLUENZA VACCINE  Completed  . DEXA SCAN  Completed  . PNA vac Low Risk Adult  Completed   Fall Risk  03/24/2016  Falls in the past year? No  Comment Emmi Telephone Survey: data to providers prior to load   Functional Status Survey:    Vitals:   09/17/20 1235  BP: (!) 164/79  Pulse: 68  Resp: (!) 21  Temp: (!) 97.5 F (36.4 C)  SpO2: 92%  Weight: 112 lb (50.8 kg)  Height: 5\' 2"  (1.575 m)   Body mass index  is 20.49 kg/m. Physical Exam Vitals and nursing note reviewed.  Constitutional:      Appearance: Normal appearance.  HENT:     Head: Normocephalic and atraumatic.     Mouth/Throat:     Mouth: Mucous membranes are moist.  Eyes:     Extraocular Movements: Extraocular movements intact.     Conjunctiva/sclera: Conjunctivae normal.     Pupils: Pupils are equal, round, and reactive to light.  Cardiovascular:     Rate and Rhythm: Normal rate and regular rhythm.     Heart sounds: No murmur heard.   Pulmonary:     Effort: Pulmonary effort is normal.     Breath sounds: Rales present. No wheezing or rhonchi.     Comments: Bibasilar rales. O2 dependent.  Abdominal:     General: Bowel sounds are normal.     Palpations: Abdomen is soft.     Tenderness: There is no abdominal tenderness.  Musculoskeletal:     Cervical back: Normal range of motion and neck supple.     Right lower leg: Edema present.     Left lower leg: Edema present.     Comments: Trace edema BLE  Skin:    General: Skin is warm and dry.     Comments: Right shin skin tear is covered in dressing.   Neurological:     General: No focal deficit present.     Mental Status: She is alert. Mental status is at baseline.     Motor: No weakness.     Coordination: Coordination normal.     Gait: Gait abnormal.     Comments: Oriented to person, place.  Psychiatric:        Mood and Affect: Mood normal.     Labs reviewed: Recent Labs    09/13/20 0412 09/14/20 0500 09/15/20 0500 09/16/20 0412  NA 131* 131* 133* 134*  K 4.7 4.1 3.2* 4.8  CL 91* 89* 90* 95*  CO2 28 29 32 30  GLUCOSE 91 114* 99 127*  BUN 22 21 27* 31*  CREATININE 1.09* 0.85 0.87 0.84  CALCIUM 9.5 9.4 8.8* 8.8*  MG 2.0  --   --   --    Recent Labs    06/26/20 0830 06/27/20 0413 08/01/20 0000 08/22/20 0000 09/12/20 1911  AST 23 35 31 15 48*  ALT 20 20 48* 24 42  ALKPHOS 60 56 99 101 117  BILITOT 0.8 1.5*  --   --  0.8  PROT 6.5 6.1*  --   --  5.9*   ALBUMIN 3.9 3.5 4.0 3.2* 3.2*   Recent Labs    06/27/20 0413 08/01/20 0000 08/22/20 0000 09/12/20 1732 09/13/20 0412  WBC 10.7*   < > 7.7 12.4* 12.4*  NEUTROABS  --    < > 5,421.00 10.4* 10.2*  HGB 14.2   < > 13.7 14.2 14.3  HCT 40.3   < > 42 44.5  43.9  MCV 90.6  --   --  93.3 93.4  PLT 155   < > 175 295 294   < > = values in this interval not displayed.   Lab Results  Component Value Date   TSH 2.130 11/02/2019   Lab Results  Component Value Date   HGBA1C 6.3 (H) 11/02/2019   No results found for: CHOL, HDL, LDLCALC, LDLDIRECT, TRIG, CHOLHDL  Significant Diagnostic Results in last 30 days:  CT ANGIO CHEST PE W OR WO CONTRAST  Result Date: 09/16/2020 CLINICAL DATA:  86 year old female with suspected heart failure exacerbation. Respiratory failure. EXAM: CT ANGIOGRAPHY CHEST WITH CONTRAST TECHNIQUE: Multidetector CT imaging of the chest was performed using the standard protocol during bolus administration of intravenous contrast. Multiplanar CT image reconstructions and MIPs were obtained to evaluate the vascular anatomy. CONTRAST:  66mL OMNIPAQUE IOHEXOL 350 MG/ML SOLN COMPARISON:  CTA chest 11/11/2014. Recent portable chest radiographs 09/12/2020 and earlier. FINDINGS: Cardiovascular: Excellent contrast bolus timing in the pulmonary arterial tree. Mild respiratory motion. No focal filling defect identified in the pulmonary arteries to suggest acute pulmonary embolism. Extensive Calcified aortic atherosclerosis. Little contrast in the aorta on this exam. Enlarged right heart appears new since 2016 and there is contrast reflux into the hepatic IVC and hepatic veins suggesting a degree of right heart failure. Calcified coronary artery atherosclerosis is extensive (series 5, image 155). No pericardial effusion. Mediastinum/Nodes: Small reactive appearing prevascular mediastinal lymph nodes. Otherwise negative. Lungs/Pleura: Layering pleural effusions with simple fluid density, moderate  on the right and small on the left. Superimposed pulmonary septal thickening and mild ground-glass opacity superimposed on widespread subpleural scarring, areas of early fibrosis. Major airways remain patent with atelectatic changes noted. There is mild dependent/compressive atelectasis in both lungs. Upper Abdomen: Contrast reflux into the hepatic veins and IVC. Otherwise negative visible liver, gallbladder, spleen, pancreas, adrenal glands, kidneys and bowel. Musculoskeletal: Osteopenia. No acute osseous abnormality identified. Review of the MIP images confirms the above findings. IMPRESSION: 1. No evidence of acute pulmonary embolus. 2. Cardiomegaly with evidence of right heart failure. Right (moderate) > left layering pleural effusions, and evidence of pulmonary interstitial edema. 3. Underlying chronic lung disease suspected with some subpleural scarring and/or early pulmonary fibrosis. 4. Calcified coronary artery and Aortic Atherosclerosis (ICD10-I70.0). Electronically Signed   By: Odessa Fleming M.D.   On: 09/16/2020 11:55   DG Chest Port 1 View  Result Date: 09/12/2020 CLINICAL DATA:  Worsening shortness of breath.  Hypoxia. EXAM: PORTABLE CHEST 1 VIEW COMPARISON:  06/26/2020 FINDINGS: Heart size is stable. New airspace opacities are seen in the peripheral lower lung zones bilaterally, suspicious for atypical infection or less likely edema. IMPRESSION: New bilateral peripheral lower lung airspace opacities, suspicious for atypical infection or less likely edema. Electronically Signed   By: Danae Orleans M.D.   On: 09/12/2020 18:12   ECHOCARDIOGRAM COMPLETE  Result Date: 09/13/2020    ECHOCARDIOGRAM REPORT   Patient Name:   [name] Date of Exam: 09/13/2020 Medical Rec #:  [id number]          Height:       62.0 in Accession #:    [id number]         Weight:       120.3 lb Date of Birth:  03/15/1927           BSA:          1.540 m Patient Age:    26 years  BP:           169/76 mmHg Patient  Gender: F                  HR:           87 bpm. Exam Location:  Inpatient Procedure: 2D Echo, Cardiac Doppler and Color Doppler Indications:    I50.33 Acute on chronic diastolic (congestive) heart failure  History:        Patient has prior history of Echocardiogram examinations, most                 recent 08/09/2012. CAD, Carotid Disease; Risk                 Factors:Hypertension.  Sonographer:    Elmarie Shileyiffany Dance Referring Phys: [id number] TIMOTHY S OPYD IMPRESSIONS  1. Left ventricular ejection fraction, by estimation, is 60 to 65%. The left ventricle has normal function. The left ventricle has no regional wall motion abnormalities. Left ventricular diastolic parameters are indeterminate. There is the interventricular septum is flattened in systole and diastole, consistent with right ventricular pressure and volume overload.  2. Right ventricular systolic function is moderately reduced. The right ventricular size is moderately enlarged. There is severely elevated pulmonary artery systolic pressure. The estimated right ventricular systolic pressure is 81.5 mmHg.  3. Left atrial size was severely dilated.  4. Right atrial size was severely dilated.  5. The pericardial effusion is circumferential.  6. The mitral valve is normal in structure. Mild to moderate mitral valve regurgitation. No evidence of mitral stenosis. There is moderate holosystolic prolapse of the middle segment of the anterior leaflet of the mitral valve.  7. Tricuspid valve regurgitation is moderate.  8. The aortic valve is normal in structure. There is mild calcification of the aortic valve. There is mild thickening of the aortic valve. Aortic valve regurgitation is not visualized. Mild to moderate aortic valve sclerosis/calcification is present, without any evidence of aortic stenosis.  9. The inferior vena cava is normal in size with greater than 50% respiratory variability, suggesting right atrial pressure of 3 mmHg. FINDINGS  Left Ventricle: Left  ventricular ejection fraction, by estimation, is 60 to 65%. The left ventricle has normal function. The left ventricle has no regional wall motion abnormalities. The left ventricular internal cavity size was normal in size. There is  no left ventricular hypertrophy. The interventricular septum is flattened in systole and diastole, consistent with right ventricular pressure and volume overload. Left ventricular diastolic parameters are indeterminate. Right Ventricle: The right ventricular size is moderately enlarged. No increase in right ventricular wall thickness. Right ventricular systolic function is moderately reduced. There is severely elevated pulmonary artery systolic pressure. The tricuspid regurgitant velocity is 4.43 m/s, and with an assumed right atrial pressure of 3 mmHg, the estimated right ventricular systolic pressure is 81.5 mmHg. Left Atrium: Left atrial size was severely dilated. Right Atrium: Right atrial size was severely dilated. Pericardium: Trivial pericardial effusion is present. The pericardial effusion is circumferential. Mitral Valve: The mitral valve is normal in structure. There is moderate holosystolic prolapse of the middle segment of the anterior leaflet of the mitral valve. There is mild thickening of the mitral valve leaflet(s). Mild to moderate mitral valve regurgitation. No evidence of mitral valve stenosis. Tricuspid Valve: The tricuspid valve is normal in structure. Tricuspid valve regurgitation is moderate . No evidence of tricuspid stenosis. Aortic Valve: The aortic valve is normal in structure. There is mild calcification of the aortic valve. There  is mild thickening of the aortic valve. Aortic valve regurgitation is not visualized. Aortic regurgitation PHT measures 402 msec. Mild to moderate aortic valve sclerosis/calcification is present, without any evidence of aortic stenosis. Pulmonic Valve: The pulmonic valve was normal in structure. Pulmonic valve regurgitation is not  visualized. No evidence of pulmonic stenosis. Aorta: The aortic root is normal in size and structure. Venous: The inferior vena cava is normal in size with greater than 50% respiratory variability, suggesting right atrial pressure of 3 mmHg. IAS/Shunts: No atrial level shunt detected by color flow Doppler.  LEFT VENTRICLE PLAX 2D LVIDd:         3.45 cm  Diastology LVIDs:         2.00 cm  LV e' medial:    4.13 cm/s LV PW:         1.25 cm  LV E/e' medial:  25.7 LV IVS:        0.95 cm  LV e' lateral:   5.87 cm/s LVOT diam:     1.50 cm  LV E/e' lateral: 18.1 LV SV:         22 LV SV Index:   14 LVOT Area:     1.77 cm  RIGHT VENTRICLE            IVC RV Basal diam:  3.50 cm    IVC diam: 1.80 cm RV Mid diam:    2.80 cm RV S prime:     6.74 cm/s TAPSE (M-mode): 0.8 cm LEFT ATRIUM             Index       RIGHT ATRIUM           Index LA diam:        3.70 cm 2.40 cm/m  RA Area:     23.80 cm LA Vol (A2C):   93.9 ml 60.96 ml/m RA Volume:   79.60 ml  51.68 ml/m LA Vol (A4C):   40.4 ml 26.23 ml/m LA Biplane Vol: 62.3 ml 40.45 ml/m  AORTIC VALVE LVOT Vmax:   61.40 cm/s LVOT Vmean:  46.000 cm/s LVOT VTI:    0.126 m AI PHT:      402 msec  AORTA Ao Root diam: 3.00 cm Ao Asc diam:  2.50 cm MITRAL VALVE                TRICUSPID VALVE MV Area (PHT): 4.49 cm     TR Peak grad:   78.5 mmHg MV Decel Time: 169 msec     TR Vmax:        443.00 cm/s MV E velocity: 106.00 cm/s MV A velocity: 47.40 cm/s   SHUNTS MV E/A ratio:  2.24         Systemic VTI:  0.13 m                             Systemic Diam: 1.50 cm Donato Schultz MD Electronically signed by Donato Schultz MD Signature Date/Time: 09/13/2020/3:22:13 PM    Final     Assessment/Plan Acute on chronic diastolic CHF (congestive heart failure) (HCC) cute on chronic diastolic heart failure, complicated with acute hypoxic respiratory failure, cardiogentic pulmonary edema, received aggressive diuresis with IV Furosemide. CTA was negative for PE. On prn Furosemide for wt gain 3Ibs/24 hours  or 5Ibs/wk. EF 60-65%, significant pulmonary hypertension.  BLE edema, prn Furosemide. Venous US BLE 08/23/20 negative for DVT.   Frequent falls Recurrent falls,  increased weakness, lack of safety awareness are contributory.  Mixed dementia (HCC) takes Memantine.No behaviors.the patient needs close supervision for safety due to her decreased safety awareness.   Essential hypertension vs Renovascular hbp Renovascular HTN, mild elevated Sbp, takingMetoprolol, Losartan, Bun/creat31/0.84 09/16/20   Carotid arterial disease (HCC) Carotidarterystenosis US carotid artery: <40% internal carotid artery stenosis bilaterally, s/p endarterectomy.   CAD (coronary artery disease) CAD, PVD, takes ASA, Rosuvastatin. EKG 08/24/20 no significant ST-T changes. 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  Slow transit constipation  takes prn Bisacodyl suppository   GERD (gastroesophageal reflux disease)  takes Pantoprazole, Famotidine, Hgb 14.3 09/13/20   Lower back pain general, takes Tylenol 650mg  qhs.   Restless leg syndrome  takes Requip   New onset seizure without head trauma (HCC)  onset 06/26/20, negative CT, EEG, MRI, may consider Keppra if recurs, f/u Neurology     Family/ staff Communication: plan of care reviewed with the patient and charge nurse.   Labs/tests ordered:  none  Time spend 35 minutes.

## 2020-09-17 NOTE — Assessment & Plan Note (Signed)
cute on chronic diastolic heart failure, complicated with acute hypoxic respiratory failure, cardiogentic pulmonary edema, received aggressive diuresis with IV Furosemide. CTA was negative for PE. On prn Furosemide for wt gain 3Ibs/24 hours or 5Ibs/wk. EF 60-65%, significant pulmonary hypertension.  BLE edema, prn Furosemide. Venous US BLE 08/23/20 negative for DVT.

## 2020-09-17 NOTE — Assessment & Plan Note (Signed)
takes Pantoprazole, Famotidine, Hgb 14.3 09/13/20

## 2020-09-17 NOTE — Assessment & Plan Note (Signed)
takes Memantine.No behaviors.the patient needs close supervision for safety due to her decreased safety awareness.

## 2020-09-17 NOTE — Assessment & Plan Note (Signed)
takes Requip

## 2020-09-17 NOTE — Assessment & Plan Note (Signed)
Recurrent falls, increased weakness, lack of safety awareness are contributory.

## 2020-09-18 ENCOUNTER — Encounter: Payer: Self-pay | Admitting: Internal Medicine

## 2020-09-18 ENCOUNTER — Non-Acute Institutional Stay (SKILLED_NURSING_FACILITY): Payer: Medicare Other | Admitting: Internal Medicine

## 2020-09-18 DIAGNOSIS — G2581 Restless legs syndrome: Secondary | ICD-10-CM

## 2020-09-18 DIAGNOSIS — E782 Mixed hyperlipidemia: Secondary | ICD-10-CM | POA: Diagnosis not present

## 2020-09-18 DIAGNOSIS — G309 Alzheimer's disease, unspecified: Secondary | ICD-10-CM | POA: Diagnosis not present

## 2020-09-18 DIAGNOSIS — I10 Essential (primary) hypertension: Secondary | ICD-10-CM | POA: Diagnosis not present

## 2020-09-18 DIAGNOSIS — F0150 Vascular dementia without behavioral disturbance: Secondary | ICD-10-CM | POA: Diagnosis not present

## 2020-09-18 DIAGNOSIS — N1831 Chronic kidney disease, stage 3a: Secondary | ICD-10-CM | POA: Diagnosis not present

## 2020-09-18 DIAGNOSIS — R569 Unspecified convulsions: Secondary | ICD-10-CM | POA: Diagnosis not present

## 2020-09-18 DIAGNOSIS — F0280 Dementia in other diseases classified elsewhere without behavioral disturbance: Secondary | ICD-10-CM

## 2020-09-18 DIAGNOSIS — I5033 Acute on chronic diastolic (congestive) heart failure: Secondary | ICD-10-CM | POA: Diagnosis not present

## 2020-09-18 NOTE — Progress Notes (Signed)
Provider:   Location:  [facility] Room Number: 2 Place of Service:  SNF (31)  PCP: Mahlon Gammon, MD Patient Care Team: Mahlon Gammon, MD as PCP - General (Internal Medicine) Susa Griffins, MD (Inactive) (Cardiology)  Extended Emergency Contact Information Primary Emergency Contact: [name] Home Phone: [phone] Relation: Son Secondary Emergency Contact: Franne Forts [place] Home Phone: [phone] Relation: Friend  Code Status:  Goals of Care: Advanced Directive information Advanced Directives 09/12/2020  Does Patient Have a Medical Advance Directive? Yes  Type of Estate agent of [place];Living will;Out of facility DNR (pink MOST or yellow form)  Does patient want to make changes to medical advance directive? No - Patient declined  Copy of Healthcare Power of Attorney in Chart? No - copy requested  Would patient like information on creating a medical advance directive? -  Pre-existing out of facility DNR order (yellow form or pink MOST form) -      Chief Complaint  Patient presents with  . New Admit To SNF    Admission to SNF    HPI: Patient is a 87 y.o. female seen today for Readmission to SNF for therapy  Admitted in hospital from 2/24-2/28 for Acute CHF with Hypoxia  Patient has h/o Hypertension, Hyperlipidemia, CAD, MVP, Restless Leg, Hiatal Hernia, Osteoporosisand Arthritis Admitted in hospital from 12/08 - 12/10 for Possible seizure  Started having SOB in the facility few weeks ago Started on Lasix Also treated with Augmentin for Aspiration Pneumonia She was send to ED for Severe SOB and Hypoxia Had CTA was negative for PE.  Was positive for bilateral pleural effusion.  Her echo showed EF of 60% but RV pressure was high with significant pulmonary HT  She was treated with Lasix and responded. Patient also had an episode of delirium in the hospital requiring Haldol and  olanzapine.  She is now back in SNF for therapy Continues to be very confused.  Denies any shortness of breath. No chest pain no coughing no fever. Keeps asking why she is here and when she can go back home.     Past Medical History:  Diagnosis Date  . Anemia   . Carotid arterial disease (HCC)    asymptomatic   . Chest tightness   . Coronary artery disease   . High blood pressure   . Leg pain    with walking  . Palpitations    Past Surgical History:  Procedure Laterality Date  . CARDIAC CATHETERIZATION  07/15/04   noncritical CAD,80% PLA treated medically. EF% 60. no RAS.  [name] CAROTID ENDARTERECTOMY     RIGHT=05/11/11, LEFT=03/30/11  . EYE SURGERY  2011   Cataract surgery Bilateral  . EYE SURGERY  2011   Bilateral eyelid surgery for ptosis  . renal artery duplex  02/23/13   ABDOMINAL AORTA:<50% DIAMETER REDUCTION. RIGHT RENAL ARTERY: 60-99% DIAMETER REDUCTION.[name] LEFT RENAL ARTERY:1-59% DIAMETER REDUCTION.    reports that she has never smoked. She has never used smokeless tobacco. She reports that she does not drink alcohol and does not use drugs. Social History   Socioeconomic History  . Marital status: Widowed    Spouse name: Not on file  . Number of children: Not on file  . Years of education: Not on file  . Highest education level: Not on file  Occupational History  . Not on file  Tobacco Use  . Smoking status: Never Smoker  . Smokeless tobacco: Never Used  Substance and  Sexual Activity  . Alcohol use: No  . Drug use: No  . Sexual activity: Not on file  Other Topics Concern  . Not on file  Social History Narrative  . Not on file   Social Determinants of Health   Financial Resource Strain: Not on file  Food Insecurity: Not on file  Transportation Needs: Not on file  Physical Activity: Not on file  Stress: Not on file  Social Connections: Not on file  Intimate Partner Violence: Not on file    Functional Status Survey:    Family History  Problem  Relation Age of Onset  . Lung disease Mother   . Heart disease Father   . Cancer Sister        liver cancer  . Heart disease Brother     Health Maintenance  Topic Date Due  . COVID-19 Vaccine (4 - Booster for Moderna series) 12/01/2020  . TETANUS/TDAP  09/22/2027  . INFLUENZA VACCINE  Completed  . DEXA SCAN  Completed  . PNA vac Low Risk Adult  Completed  . HPV VACCINES  Aged Out    Allergies  Allergen Reactions  . Cardura [Doxazosin Mesylate] Other (See Comments)    Reaction not recalled by the patient  . Lotensin [Benazepril Hcl] Other (See Comments)    Reaction not recalled by the patient  . Tramadol Nausea And Vomiting    Outpatient Encounter Medications as of 09/18/2020  Medication Sig  . acetaminophen (TYLENOL) 325 MG tablet Take 650 mg by mouth at bedtime.  [name] aspirin EC 81 MG tablet Take 1 tablet (81 mg total) by mouth daily.  . bisacodyl (DULCOLAX) 10 MG suppository Place 10 mg rectally daily as needed for moderate constipation.  . famotidine (PEPCID) 20 MG tablet Take 20 mg by mouth daily.  . feeding supplement (ENSURE ENLIVE / ENSURE PLUS) LIQD Take 237 mLs by mouth daily.  . furosemide (LASIX) 20 MG tablet Take 20 mg by mouth daily as needed.  [name] losartan (COZAAR) 25 MG tablet Take 25 mg by mouth in the morning and at bedtime.  . memantine (NAMENDA) 5 MG tablet Take 5 mg by mouth 2 (two) times daily.  . metoprolol tartrate (LOPRESSOR) 25 MG tablet Take 12.5 mg by mouth 2 (two) times daily.  . pantoprazole (PROTONIX) 40 MG tablet Take 40 mg by mouth daily.  . potassium chloride SA (KLOR-CON) 20 MEQ tablet Take 20 mEq by mouth every morning.  [name] rOPINIRole (REQUIP) 1 MG tablet Take 1 mg by mouth every evening.  . rosuvastatin (CRESTOR) 10 MG tablet Take 10 mg by mouth at bedtime.   . Vitamin D3 (VITAMIN D) 25 MCG tablet Take 1,000 Units by mouth daily.  [name] zinc oxide 20 % ointment Apply 1 application topically as needed for irritation.   No facility-administered  encounter medications on file as of 09/18/2020.    Review of Systems  Unable to perform ROS: Dementia    Vitals:   09/18/20 1250  BP: (!) 98/43  Pulse: 78  Resp: 20  Temp: (!) 97.4 F (36.3 C)  SpO2: 94%  Weight: 112 lb (50.8 kg)  Height:  (1.575 m)   Body mass index is 20.49 kg/m. Physical Exam Vitals reviewed.  Constitutional:      Appearance: Normal appearance.  HENT:     Head: Normocephalic.     Nose: Nose normal.     Mouth/Throat:     Mouth: Mucous membranes are moist.     Pharynx: Oropharynx is  clear.  Eyes:     Pupils: Pupils are equal, round, and reactive to light.  Cardiovascular:     Rate and Rhythm: Normal rate and regular rhythm.     Pulses: Normal pulses.  Pulmonary:     Effort: Pulmonary effort is normal.     Breath sounds: Rales present.  Abdominal:     General: Abdomen is flat. Bowel sounds are normal.     Palpations: Abdomen is soft.  Musculoskeletal:        General: Swelling present.     Cervical back: Neck supple.  Skin:    General: Skin is warm.  Neurological:     General: No focal deficit present.     Mental Status: She is alert.  Psychiatric:        Mood and Affect: Mood normal.     Labs reviewed: Basic Metabolic Panel: Recent Labs    09/13/20 0412 09/14/20 0500 09/15/20 0500 09/16/20 0412  NA 131* 131* 133* 134*  K 4.7 4.1 3.2* 4.8  CL 91* 89* 90* 95*  CO2 28 29 32 30  GLUCOSE 91 114* 99 127*  BUN 22 21 27* 31*  CREATININE 1.09* 0.85 0.87 0.84  CALCIUM 9.5 9.4 8.8* 8.8*  MG 2.0  --   --   --    Liver Function Tests: Recent Labs    06/26/20 0830 06/27/20 0413 08/01/20 0000 08/22/20 0000 09/12/20 1911  AST 23 35 31 15 48*  ALT 20 20 48* 24 42  ALKPHOS 60 56 99 101 117  BILITOT 0.8 1.5*  --   --  0.8  PROT 6.5 6.1*  --   --  5.9*  ALBUMIN 3.9 3.5 4.0 3.2* 3.2*   No results for input(s): LIPASE, AMYLASE in the last 8760 hours. No results for input(s): AMMONIA in the last 8760 hours. CBC: Recent Labs     06/27/20 0413 08/01/20 0000 08/22/20 0000 09/12/20 1732 09/13/20 0412  WBC 10.7*   < > 7.7 12.4* 12.4*  NEUTROABS  --    < > 5,421.00 10.4* 10.2*  HGB 14.2   < > 13.7 14.2 14.3  HCT 40.3   < > 42 44.5 43.9  MCV 90.6  --   --  93.3 93.4  PLT 155   < > 175 295 294   < > = values in this interval not displayed.   Cardiac Enzymes: No results for input(s): CKTOTAL, CKMB, CKMBINDEX, TROPONINI in the last 8760 hours. BNP: Invalid input(s): POCBNP Lab Results  Component Value Date   HGBA1C 6.3 (H) 11/02/2019   Lab Results  Component Value Date   TSH 2.130 11/02/2019   Lab Results  Component Value Date   VITAMINB12 1,386 (H) 11/02/2019   Lab Results  Component Value Date   FOLATE 11.5 11/02/2019   No results found for: IRON, TIBC, FERRITIN  Imaging and Procedures obtained prior to SNF admission: DG Chest Port 1 View  Result Date: 09/12/2020 CLINICAL DATA:  Worsening shortness of breath.  Hypoxia. EXAM: PORTABLE CHEST 1 VIEW COMPARISON:  06/26/2020 FINDINGS: Heart size is stable. New airspace opacities are seen in the peripheral lower lung zones bilaterally, suspicious for atypical infection or less likely edema. IMPRESSION: New bilateral peripheral lower lung airspace opacities, suspicious for atypical infection or less likely edema. Electronically Signed   By: Danae Orleans M.D.   On: 09/12/2020 18:12   ECHOCARDIOGRAM COMPLETE  Result Date: 09/13/2020    ECHOCARDIOGRAM REPORT   Patient Name:   [name]  Bolding Date of Exam: 09/13/2020 Medical Rec #:  [id number]          Height:       62.0 in Accession #:    [id number]         Weight:       120.3 lb Date of Birth:  Jul 10, 1927           BSA:          1.540 m Patient Age:    89 years           BP:           169/76 mmHg Patient Gender: F                  HR:           87 bpm. Exam Location:  Inpatient Procedure: 2D Echo, Cardiac Doppler and Color Doppler Indications:    I50.33 Acute on chronic diastolic (congestive) heart failure   History:        Patient has prior history of Echocardiogram examinations, most                 recent 08/09/2012. CAD, Carotid Disease; Risk                 Factors:Hypertension.  Sonographer:    Elmarie Shiley Dance Referring Phys: [id number] TIMOTHY S OPYD IMPRESSIONS  1. Left ventricular ejection fraction, by estimation, is 60 to 65%. The left ventricle has normal function. The left ventricle has no regional wall motion abnormalities. Left ventricular diastolic parameters are indeterminate. There is the interventricular septum is flattened in systole and diastole, consistent with right ventricular pressure and volume overload.  2. Right ventricular systolic function is moderately reduced. The right ventricular size is moderately enlarged. There is severely elevated pulmonary artery systolic pressure. The estimated right ventricular systolic pressure is 81.5 mmHg.  3. Left atrial size was severely dilated.  4. Right atrial size was severely dilated.  5. The pericardial effusion is circumferential.  6. The mitral valve is normal in structure. Mild to moderate mitral valve regurgitation. No evidence of mitral stenosis. There is moderate holosystolic prolapse of the middle segment of the anterior leaflet of the mitral valve.  7. Tricuspid valve regurgitation is moderate.  8. The aortic valve is normal in structure. There is mild calcification of the aortic valve. There is mild thickening of the aortic valve. Aortic valve regurgitation is not visualized. Mild to moderate aortic valve sclerosis/calcification is present, without any evidence of aortic stenosis.  9. The inferior vena cava is normal in size with greater than 50% respiratory variability, suggesting right atrial pressure of 3 mmHg. FINDINGS  Left Ventricle: Left ventricular ejection fraction, by estimation, is 60 to 65%. The left ventricle has normal function. The left ventricle has no regional wall motion abnormalities. The left ventricular internal cavity size was  normal in size. There is  no left ventricular hypertrophy. The interventricular septum is flattened in systole and diastole, consistent with right ventricular pressure and volume overload. Left ventricular diastolic parameters are indeterminate. Right Ventricle: The right ventricular size is moderately enlarged. No increase in right ventricular wall thickness. Right ventricular systolic function is moderately reduced. There is severely elevated pulmonary artery systolic pressure. The tricuspid regurgitant velocity is 4.43 m/s, and with an assumed right atrial pressure of 3 mmHg, the estimated right ventricular systolic pressure is 81.5 mmHg. Left Atrium: Left atrial size was severely dilated. Right Atrium: Right atrial size was severely dilated. Pericardium:  Trivial pericardial effusion is present. The pericardial effusion is circumferential. Mitral Valve: The mitral valve is normal in structure. There is moderate holosystolic prolapse of the middle segment of the anterior leaflet of the mitral valve. There is mild thickening of the mitral valve leaflet(s). Mild to moderate mitral valve regurgitation. No evidence of mitral valve stenosis. Tricuspid Valve: The tricuspid valve is normal in structure. Tricuspid valve regurgitation is moderate . No evidence of tricuspid stenosis. Aortic Valve: The aortic valve is normal in structure. There is mild calcification of the aortic valve. There is mild thickening of the aortic valve. Aortic valve regurgitation is not visualized. Aortic regurgitation PHT measures 402 msec. Mild to moderate aortic valve sclerosis/calcification is present, without any evidence of aortic stenosis. Pulmonic Valve: The pulmonic valve was normal in structure. Pulmonic valve regurgitation is not visualized. No evidence of pulmonic stenosis. Aorta: The aortic root is normal in size and structure. Venous: The inferior vena cava is normal in size with greater than 50% respiratory variability, suggesting  right atrial pressure of 3 mmHg. IAS/Shunts: No atrial level shunt detected by color flow Doppler.  LEFT VENTRICLE PLAX 2D LVIDd:         3.45 cm  Diastology LVIDs:         2.00 cm  LV e' medial:    4.13 cm/s LV PW:         1.25 cm  LV E/e' medial:  25.7 LV IVS:        0.95 cm  LV e' lateral:   5.87 cm/s LVOT diam:     1.50 cm  LV E/e' lateral: 18.1 LV SV:         22 LV SV Index:   14 LVOT Area:     1.77 cm  RIGHT VENTRICLE            IVC RV Basal diam:  3.50 cm    IVC diam: 1.80 cm RV Mid diam:    2.80 cm RV S prime:     6.74 cm/s TAPSE (M-mode): 0.8 cm LEFT ATRIUM             Index       RIGHT ATRIUM           Index LA diam:        3.70 cm 2.40 cm/m  RA Area:     23.80 cm LA Vol (A2C):   93.9 ml 60.96 ml/m RA Volume:   79.60 ml  51.68 ml/m LA Vol (A4C):   40.4 ml 26.23 ml/m LA Biplane Vol: 62.3 ml 40.45 ml/m  AORTIC VALVE LVOT Vmax:   61.40 cm/s LVOT Vmean:  46.000 cm/s LVOT VTI:    0.126 m AI PHT:      402 msec  AORTA Ao Root diam: 3.00 cm Ao Asc diam:  2.50 cm MITRAL VALVE                TRICUSPID VALVE MV Area (PHT): 4.49 cm     TR Peak grad:   78.5 mmHg MV Decel Time: 169 msec     TR Vmax:        443.00 cm/s MV E velocity: 106.00 cm/s MV A velocity: 47.40 cm/s   SHUNTS MV E/A ratio:  2.24         Systemic VTI:  0.13 m  Systemic Diam: 1.50 cm Donato Schultz MD Electronically signed by Donato Schultz MD Signature Date/Time: 09/13/2020/3:22:13 PM    Final     Assessment/Plan Acute on chronic diastolic CHF (congestive heart failure) (HCC) Will Continue on Lasix 20 mg with Potassium  Essential hypertension vs Renovascular hbp BP varies and is low today Will keep Passive BP Change Cozaar to 25 mg QPM Also Hold if SBP less then 120 Chronic kidney disease, stage 3a (HCC) Repeat BMP on LAsix Mixed hyperlipidemia On Statin Mixed dementia (HCC) Continue Namenda Restless leg syndrome On requip Seizure (HCC) One episode few months ago Neurology said no treatment unless  recur Osteoporosis Refuses treatment Was on Prolia before .   Family/ staff Communication:   Labs/tests ordered: BMP,CBC, Lipid Panel, TSH in 1 week

## 2020-09-19 ENCOUNTER — Encounter: Payer: Self-pay | Admitting: Internal Medicine

## 2020-09-19 ENCOUNTER — Non-Acute Institutional Stay (SKILLED_NURSING_FACILITY): Payer: Medicare Other | Admitting: Internal Medicine

## 2020-09-19 DIAGNOSIS — F0391 Unspecified dementia with behavioral disturbance: Secondary | ICD-10-CM

## 2020-09-19 DIAGNOSIS — I5033 Acute on chronic diastolic (congestive) heart failure: Secondary | ICD-10-CM | POA: Diagnosis not present

## 2020-09-19 DIAGNOSIS — I10 Essential (primary) hypertension: Secondary | ICD-10-CM

## 2020-09-19 DIAGNOSIS — R0602 Shortness of breath: Secondary | ICD-10-CM | POA: Diagnosis not present

## 2020-09-19 MED ORDER — LORAZEPAM 0.5 MG PO TABS: 0.2500 mg | ORAL_TABLET | Freq: Three times a day (TID) | ORAL | 0 refills | Status: AC | PRN

## 2020-09-19 NOTE — Progress Notes (Signed)
Location: [facility] Room Number: 2 Place of Service:  SNF (31)  Provider:   Code Status: DNR Goals of Care:  Advanced Directives 09/12/2020  Does Patient Have a Medical Advance Directive? Yes  Type of Estate agent of [place];Living will;Out of facility DNR (pink MOST or yellow form)  Does patient want to make changes to medical advance directive? No - Patient declined  Copy of Healthcare Power of Attorney in Chart? No - copy requested  Would patient like information on creating a medical advance directive? -  Pre-existing out of facility DNR order (yellow form or pink MOST form) -     Chief Complaint  Patient presents with  . Acute Visit    SOB    HPI: Patient is a 87 y.o. female seen today for an acute visit for acute onset of shortness of breath and agitation with hypoxia  Admitted in hospital from 2/24-2/28 for Acute CHF with Hypoxia  Patient has h/o Hypertension, Hyperlipidemia, CAD, MVP, Restless Leg, Hiatal Hernia, Osteoporosisand Arthritis Admitted in hospital from 06/26/20 - 06/28/20 for Possible seizure  Patient who was recently admitted for acute CHF in the hospital.  Started getting very anxious.  Complaining of shortness of breath.  Will try to get up from the bed trying to leave the facility.  Her pulse ox was around 80% as she would not keep her oxygen on the nurses.    Unable to get much history as she is very confused. Taking her clothes off. Trying to get out of bed and agitated    Past Medical History:  Diagnosis Date  . Anemia   . Carotid arterial disease (HCC)    asymptomatic   . Chest tightness   . Coronary artery disease   . High blood pressure   . Leg pain    with walking  . Palpitations     Past Surgical History:  Procedure Laterality Date  . CARDIAC CATHETERIZATION  07/15/04   noncritical CAD,80% PLA treated medically. EF% 60. no RAS.  [name] CAROTID ENDARTERECTOMY     RIGHT=05/11/11,  LEFT=03/30/11  . EYE SURGERY  2011   Cataract surgery Bilateral  . EYE SURGERY  2011   Bilateral eyelid surgery for ptosis  . renal artery duplex  02/23/13   ABDOMINAL AORTA:<50% DIAMETER REDUCTION. RIGHT RENAL ARTERY: 60-99% DIAMETER REDUCTION.[name] LEFT RENAL ARTERY:1-59% DIAMETER REDUCTION.    Allergies  Allergen Reactions  . Cardura [Doxazosin Mesylate] Other (See Comments)    Reaction not recalled by the patient  . Lotensin [Benazepril Hcl] Other (See Comments)    Reaction not recalled by the patient  . Tramadol Nausea And Vomiting    Outpatient Encounter Medications as of 09/19/2020  Medication Sig  . acetaminophen (TYLENOL) 325 MG tablet Take 650 mg by mouth at bedtime.  [name] aspirin EC 81 MG tablet Take 1 tablet (81 mg total) by mouth daily.  . bisacodyl (DULCOLAX) 10 MG suppository Place 10 mg rectally daily as needed for moderate constipation.  . famotidine (PEPCID) 20 MG tablet Take 20 mg by mouth daily.  . feeding supplement (ENSURE ENLIVE / ENSURE PLUS) LIQD Take 237 mLs by mouth daily.  . furosemide (LASIX) 20 MG tablet Take 20 mg by mouth daily as needed.  [name] LORazepam (ATIVAN) 0.5 MG tablet Take 0.25 mg by mouth once.  [name] losartan (COZAAR) 25 MG tablet Take 25 mg by mouth in the morning and at bedtime.  . memantine (NAMENDA) 5 MG tablet Take 5  mg by mouth 2 (two) times daily.  . metoprolol tartrate (LOPRESSOR) 25 MG tablet Take 12.5 mg by mouth 2 (two) times daily.  . pantoprazole (PROTONIX) 40 MG tablet Take 40 mg by mouth daily.  . potassium chloride SA (KLOR-CON) 20 MEQ tablet Take 20 mEq by mouth every morning.  [name] rOPINIRole (REQUIP) 1 MG tablet Take 1 mg by mouth every evening.  . rosuvastatin (CRESTOR) 10 MG tablet Take 10 mg by mouth at bedtime.   . torsemide (DEMADEX) 20 MG tablet Take 20 mg by mouth once.  . Vitamin D3 (VITAMIN D) 25 MCG tablet Take 1,000 Units by mouth daily.  [name] zinc oxide 20 % ointment Apply 1 application topically as needed for irritation.   No  facility-administered encounter medications on file as of 09/19/2020.    Review of Systems:  Review of Systems  Unable to do due to Delirium  Health Maintenance  Topic Date Due  . COVID-19 Vaccine (4 - Booster for Moderna series) 12/01/2020  . TETANUS/TDAP  09/22/2027  . INFLUENZA VACCINE  Completed  . DEXA SCAN  Completed  . PNA vac Low Risk Adult  Completed  . HPV VACCINES  Aged Out    Physical Exam: Vitals:   09/19/20 1553  BP: (!) 149/87  Pulse: 94  Resp: 18  Temp: (!) 97.5 F (36.4 C)  SpO2: 96%  Weight: 112 lb (50.8 kg)  Height: 5\' 2"  (1.575 m)   Body mass index is 20.49 kg/m. Physical Exam Vitals reviewed.  Constitutional:      Appearance: Normal appearance.     Comments: Very Anxious taking her clothes out And not keeping her Oxygen  HENT:     Head: Normocephalic.     Nose: Nose normal.     Mouth/Throat:     Mouth: Mucous membranes are moist.     Pharynx: Oropharynx is clear.  Eyes:     Pupils: Pupils are equal, round, and reactive to light.  Cardiovascular:     Rate and Rhythm: Normal rate and regular rhythm.     Pulses: Normal pulses.  Pulmonary:     Effort: Pulmonary effort is normal.     Breath sounds: Rales present.  Abdominal:     General: Abdomen is flat. Bowel sounds are normal.     Palpations: Abdomen is soft.  Musculoskeletal:        General: Swelling present.  Skin:    General: Skin is warm.  Neurological:     General: No focal deficit present.     Mental Status: She is alert.     Comments: Confused and Agitated  Psychiatric:     Comments: Agitated and Confused     Labs reviewed: Basic Metabolic Panel: Recent Labs    11/02/19 1122 11/09/19 1441 09/13/20 0412 09/14/20 0500 09/15/20 0500 09/16/20 0412  NA  --    < > 131* 131* 133* 134*  K  --    < > 4.7 4.1 3.2* 4.8  CL  --    < > 91* 89* 90* 95*  CO2  --    < > 28 29 32 30  GLUCOSE  --    < > 91 114* 99 127*  BUN  --    < > 22 21 27* 31*  CREATININE  --    < > 1.09*  0.85 0.87 0.84  CALCIUM  --    < > 9.5 9.4 8.8* 8.8*  MG  --   --  2.0  --   --   --  TSH 2.130  --   --   --   --   --    < > = values in this interval not displayed.   Liver Function Tests: Recent Labs    06/26/20 0830 06/27/20 0413 08/01/20 0000 08/22/20 0000 09/12/20 1911  AST 23 35 31 15 48*  ALT 20 20 48* 24 42  ALKPHOS 60 56 99 101 117  BILITOT 0.8 1.5*  --   --  0.8  PROT 6.5 6.1*  --   --  5.9*  ALBUMIN 3.9 3.5 4.0 3.2* 3.2*   No results for input(s): LIPASE, AMYLASE in the last 8760 hours. No results for input(s): AMMONIA in the last 8760 hours. CBC: Recent Labs    06/27/20 0413 08/01/20 0000 08/22/20 0000 09/12/20 1732 09/13/20 0412  WBC 10.7*   < > 7.7 12.4* 12.4*  NEUTROABS  --    < > 5,421.00 10.4* 10.2*  HGB 14.2   < > 13.7 14.2 14.3  HCT 40.3   < > 42 44.5 43.9  MCV 90.6  --   --  93.3 93.4  PLT 155   < > 175 295 294   < > = values in this interval not displayed.   Lipid Panel: No results for input(s): CHOL, HDL, LDLCALC, TRIG, CHOLHDL, LDLDIRECT in the last 8760 hours. Lab Results  Component Value Date   HGBA1C 6.3 (H) 11/02/2019    Procedures since last visit: CT ANGIO CHEST PE W OR WO CONTRAST  Result Date: 09/16/2020 CLINICAL DATA:  87 year old female with suspected heart failure exacerbation. Respiratory failure. EXAM: CT ANGIOGRAPHY CHEST WITH CONTRAST TECHNIQUE: Multidetector CT imaging of the chest was performed using the standard protocol during bolus administration of intravenous contrast. Multiplanar CT image reconstructions and MIPs were obtained to evaluate the vascular anatomy. CONTRAST:  75mL OMNIPAQUE IOHEXOL 350 MG/ML SOLN COMPARISON:  CTA chest 11/11/2014. Recent portable chest radiographs 09/12/2020 and earlier. FINDINGS: Cardiovascular: Excellent contrast bolus timing in the pulmonary arterial tree. Mild respiratory motion. No focal filling defect identified in the pulmonary arteries to suggest acute pulmonary embolism. Extensive  Calcified aortic atherosclerosis. Little contrast in the aorta on this exam. Enlarged right heart appears new since 2016 and there is contrast reflux into the hepatic IVC and hepatic veins suggesting a degree of right heart failure. Calcified coronary artery atherosclerosis is extensive (series 5, image 155). No pericardial effusion. Mediastinum/Nodes: Small reactive appearing prevascular mediastinal lymph nodes. Otherwise negative. Lungs/Pleura: Layering pleural effusions with simple fluid density, moderate on the right and small on the left. Superimposed pulmonary septal thickening and mild ground-glass opacity superimposed on widespread subpleural scarring, areas of early fibrosis. Major airways remain patent with atelectatic changes noted. There is mild dependent/compressive atelectasis in both lungs. Upper Abdomen: Contrast reflux into the hepatic veins and IVC. Otherwise negative visible liver, gallbladder, spleen, pancreas, adrenal glands, kidneys and bowel. Musculoskeletal: Osteopenia. No acute osseous abnormality identified. Review of the MIP images confirms the above findings. IMPRESSION: 1. No evidence of acute pulmonary embolus. 2. Cardiomegaly with evidence of right heart failure. Right (moderate) > left layering pleural effusions, and evidence of pulmonary interstitial edema. 3. Underlying chronic lung disease suspected with some subpleural scarring and/or early pulmonary fibrosis. 4. Calcified coronary artery and Aortic Atherosclerosis (ICD10-I70.0). Electronically Signed   By: Odessa FlemingH  Hall M.D.   On: 09/16/2020 11:55   DG Chest Port 1 View  Result Date: 09/12/2020 CLINICAL DATA:  Worsening shortness of breath.  Hypoxia. EXAM: PORTABLE CHEST 1 VIEW COMPARISON:  06/26/2020 FINDINGS: Heart size is stable. New airspace opacities are seen in the peripheral lower lung zones bilaterally, suspicious for atypical infection or less likely edema. IMPRESSION: New bilateral peripheral lower lung airspace  opacities, suspicious for atypical infection or less likely edema. Electronically Signed   By: Danae Orleans M.D.   On: 09/12/2020 18:12   ECHOCARDIOGRAM COMPLETE  Result Date: 09/13/2020    ECHOCARDIOGRAM REPORT   Patient Name:   [name] Date of Exam: 09/13/2020 Medical Rec #:  [id number]          Height:       62.0 in Accession #:    [id number]         Weight:       120.3 lb Date of Birth:  [birth year]           BSA:          1.540 m Patient Age:    87 years           BP:           169/76 mmHg Patient Gender: F                  HR:           87 bpm. Exam Location:  Inpatient Procedure: 2D Echo, Cardiac Doppler and Color Doppler Indications:    I50.33 Acute on chronic diastolic (congestive) heart failure  History:        Patient has prior history of Echocardiogram examinations, most                 recent 08/09/2012. CAD, Carotid Disease; Risk                 Factors:Hypertension.  Sonographer:    Elmarie Shiley Dance Referring Phys: [id number] TIMOTHY S OPYD IMPRESSIONS  1. Left ventricular ejection fraction, by estimation, is 60 to 65%. The left ventricle has normal function. The left ventricle has no regional wall motion abnormalities. Left ventricular diastolic parameters are indeterminate. There is the interventricular septum is flattened in systole and diastole, consistent with right ventricular pressure and volume overload.  2. Right ventricular systolic function is moderately reduced. The right ventricular size is moderately enlarged. There is severely elevated pulmonary artery systolic pressure. The estimated right ventricular systolic pressure is 81.5 mmHg.  3. Left atrial size was severely dilated.  4. Right atrial size was severely dilated.  5. The pericardial effusion is circumferential.  6. The mitral valve is normal in structure. Mild to moderate mitral valve regurgitation. No evidence of mitral stenosis. There is moderate holosystolic prolapse of the middle segment of the anterior leaflet of the  mitral valve.  7. Tricuspid valve regurgitation is moderate.  8. The aortic valve is normal in structure. There is mild calcification of the aortic valve. There is mild thickening of the aortic valve. Aortic valve regurgitation is not visualized. Mild to moderate aortic valve sclerosis/calcification is present, without any evidence of aortic stenosis.  9. The inferior vena cava is normal in size with greater than 50% respiratory variability, suggesting right atrial pressure of 3 mmHg. FINDINGS  Left Ventricle: Left ventricular ejection fraction, by estimation, is 60 to 65%. The left ventricle has normal function. The left ventricle has no regional wall motion abnormalities. The left ventricular internal cavity size was normal in size. There is  no left ventricular hypertrophy. The interventricular septum is flattened in systole and diastole, consistent with right ventricular pressure and volume overload. Left ventricular diastolic  parameters are indeterminate. Right Ventricle: The right ventricular size is moderately enlarged. No increase in right ventricular wall thickness. Right ventricular systolic function is moderately reduced. There is severely elevated pulmonary artery systolic pressure. The tricuspid regurgitant velocity is 4.43 m/s, and with an assumed right atrial pressure of 3 mmHg, the estimated right ventricular systolic pressure is 81.5 mmHg. Left Atrium: Left atrial size was severely dilated. Right Atrium: Right atrial size was severely dilated. Pericardium: Trivial pericardial effusion is present. The pericardial effusion is circumferential. Mitral Valve: The mitral valve is normal in structure. There is moderate holosystolic prolapse of the middle segment of the anterior leaflet of the mitral valve. There is mild thickening of the mitral valve leaflet(s). Mild to moderate mitral valve regurgitation. No evidence of mitral valve stenosis. Tricuspid Valve: The tricuspid valve is normal in structure.  Tricuspid valve regurgitation is moderate . No evidence of tricuspid stenosis. Aortic Valve: The aortic valve is normal in structure. There is mild calcification of the aortic valve. There is mild thickening of the aortic valve. Aortic valve regurgitation is not visualized. Aortic regurgitation PHT measures 402 msec. Mild to moderate aortic valve sclerosis/calcification is present, without any evidence of aortic stenosis. Pulmonic Valve: The pulmonic valve was normal in structure. Pulmonic valve regurgitation is not visualized. No evidence of pulmonic stenosis. Aorta: The aortic root is normal in size and structure. Venous: The inferior vena cava is normal in size with greater than 50% respiratory variability, suggesting right atrial pressure of 3 mmHg. IAS/Shunts: No atrial level shunt detected by color flow Doppler.  LEFT VENTRICLE PLAX 2D LVIDd:         3.45 cm  Diastology LVIDs:         2.00 cm  LV e' medial:    4.13 cm/s LV PW:         1.25 cm  LV E/e' medial:  25.7 LV IVS:        0.95 cm  LV e' lateral:   5.87 cm/s LVOT diam:     1.50 cm  LV E/e' lateral: 18.1 LV SV:         22 LV SV Index:   14 LVOT Area:     1.77 cm  RIGHT VENTRICLE            IVC RV Basal diam:  3.50 cm    IVC diam: 1.80 cm RV Mid diam:    2.80 cm RV S prime:     6.74 cm/s TAPSE (M-mode): 0.8 cm LEFT ATRIUM             Index       RIGHT ATRIUM           Index LA diam:        3.70 cm 2.40 cm/m  RA Area:     23.80 cm LA Vol (A2C):   93.9 ml 60.96 ml/m RA Volume:   79.60 ml  51.68 ml/m LA Vol (A4C):   40.4 ml 26.23 ml/m LA Biplane Vol: 62.3 ml 40.45 ml/m  AORTIC VALVE LVOT Vmax:   61.40 cm/s LVOT Vmean:  46.000 cm/s LVOT VTI:    0.126 m AI PHT:      402 msec  AORTA Ao Root diam: 3.00 cm Ao Asc diam:  2.50 cm MITRAL VALVE                TRICUSPID VALVE MV Area (PHT): 4.49 cm     TR Peak grad:   78.5 mmHg MV Decel  Time: 169 msec     TR Vmax:        443.00 cm/s MV E velocity: 106.00 cm/s MV A velocity: 47.40 cm/s   SHUNTS MV E/A ratio:   2.24         Systemic VTI:  0.13 m                             Systemic Diam: 1.50 cm Donato Schultz MD Electronically signed by Donato Schultz MD Signature Date/Time: 09/13/2020/3:22:13 PM    Final     Assessment/Plan . SOB (shortness of breath) Most Likely due to CHF Discontinued Lasix Start on Demadex 20 mg  Gave extra dose today Also oN Potassium Trying to keep Oxygen on her   Dementia with behavioral disturbance, unspecified dementia type (HCC) Ativan 0.25 mg Q 8 prn. For 2 weeks Stat dose given Started on Zyprexa 5 mg  Addenedum Patient did respond and were able to keep her Oxygen on POX came upto 90 %    3. Essential hypertension vs Renovascular hbp BP better on QD dosing of Cozaar  Other issues Chronic kidney disease, stage 3a (HCC) Repeat BMP on Demadex Mixed hyperlipidemia On Statin Mixed dementia (HCC) Continue Namenda Restless leg syndrome On requip Seizure (HCC) One episode few months ago Neurology said no treatment unless recur Osteoporosis Refuses treatment Was on Prolia before Labs/tests ordered:  * No order type specified * Next appt:  Visit date not found

## 2020-09-20 ENCOUNTER — Encounter: Payer: Self-pay | Admitting: Internal Medicine

## 2020-10-18 DEATH — deceased

## 2021-12-27 IMAGING — CT CT HEAD W/O CM
4 series · 16 of 47 positions shown, 18 images · non-contrast
Comparison: 11/09/2019

CLINICAL DATA: Found unresponsive, seizure activity

EXAM:
CT HEAD WITHOUT CONTRAST
TECHNIQUE: Contiguous axial images were obtained from the base of the skull
through the vertex without intravenous contrast.

[Series 3: head without · axial · non-contrast · 0.43mm/px · z∈[-56,+64]mm · 7 of 33 slices shown, 9 images]
[im 5/33  brain]
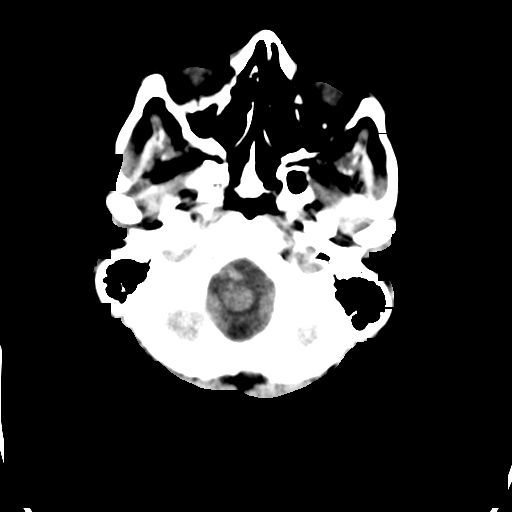
[im 5/33  bone]
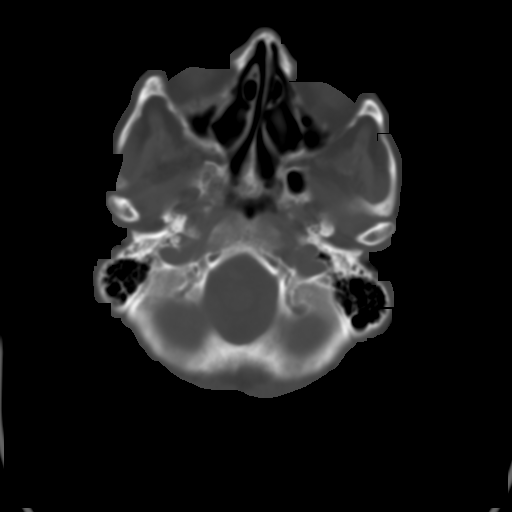
[im 9/33  brain]
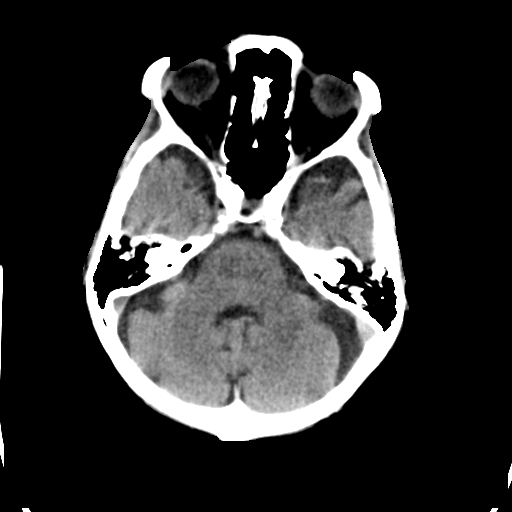
[im 13/33  brain]
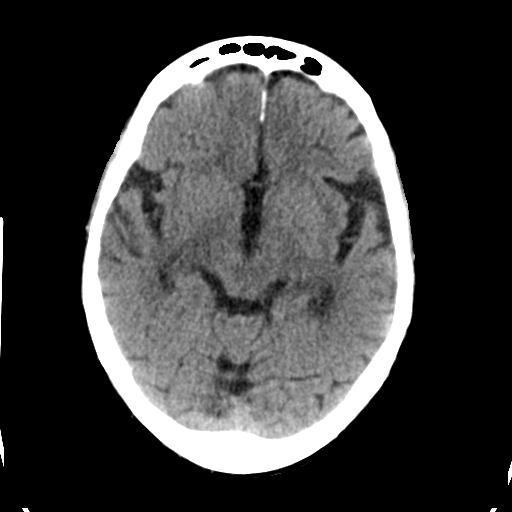
[im 17/33  brain]
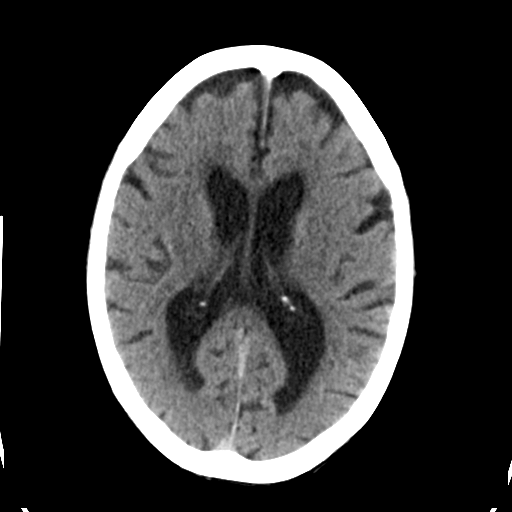
[im 21/33  brain]
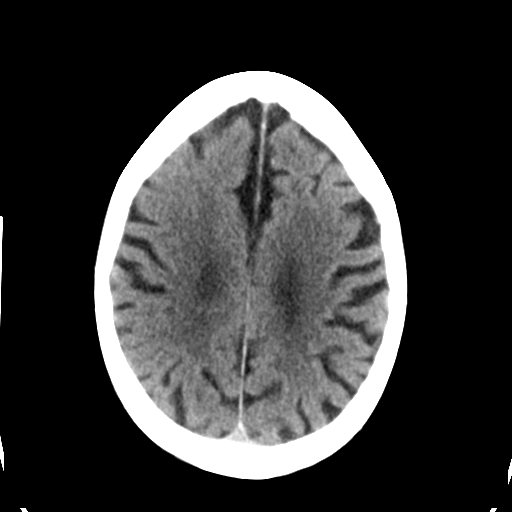
[im 21/33  bone]
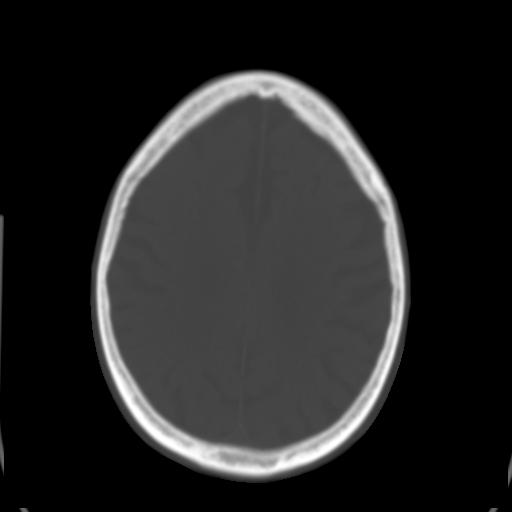
[im 25/33  brain]
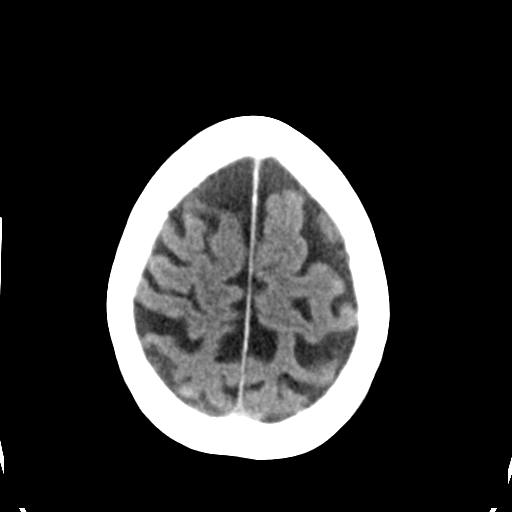
[im 29/33  brain]
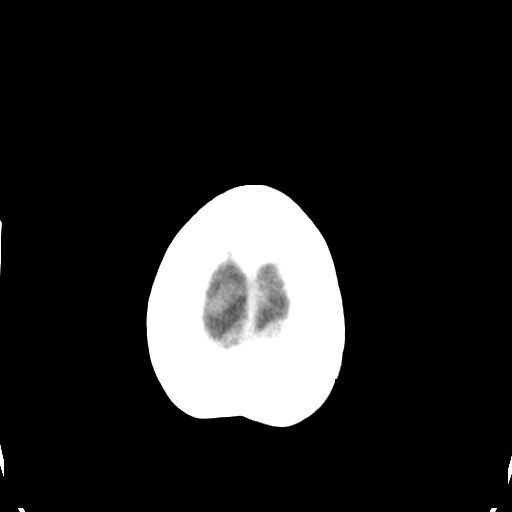

[Series 4: head bone · axial · 0.43mm/px · z∈[-60,-28]mm · 3 of 82 slices shown]
[im 9/82  bone]
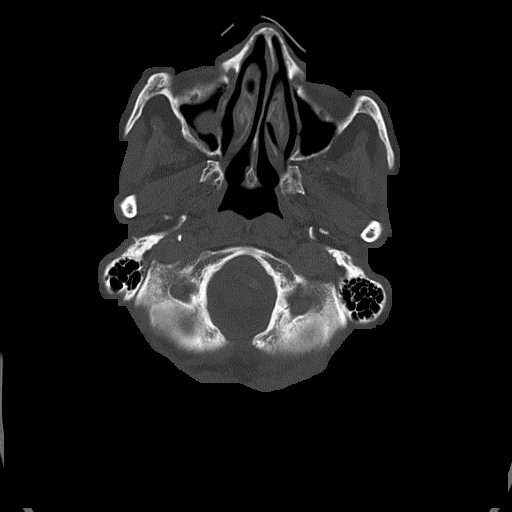
[im 17/82  bone]
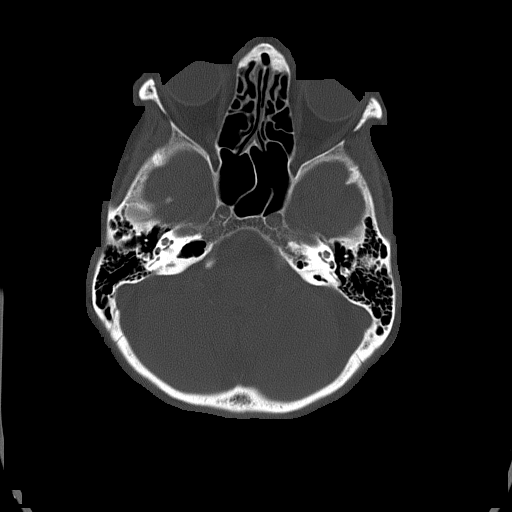
[im 25/82  bone]
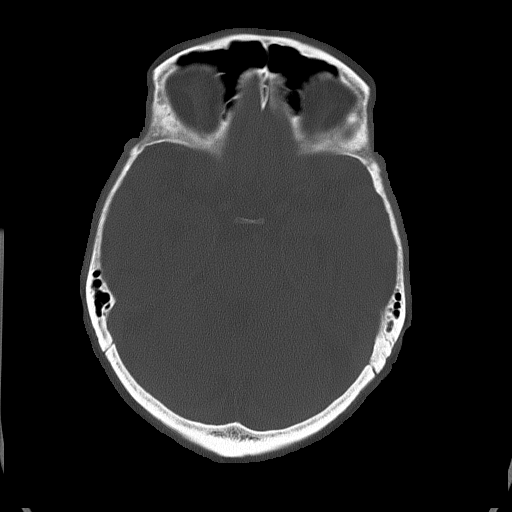

[Series 5: head without cor · coronal · non-contrast · 0.32mm/px · 3 of 67 slices shown]
[im 23/67  brain]
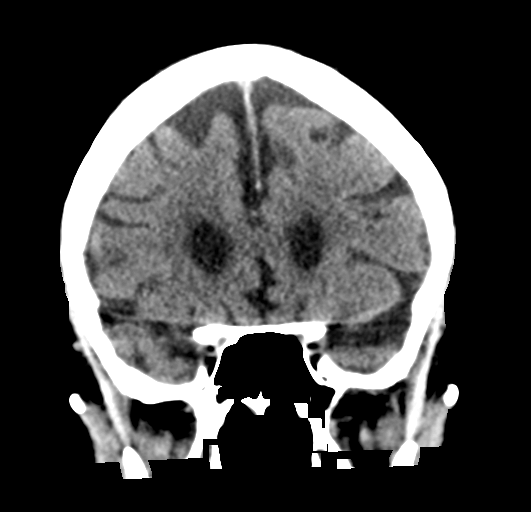
[im 30/67  brain]
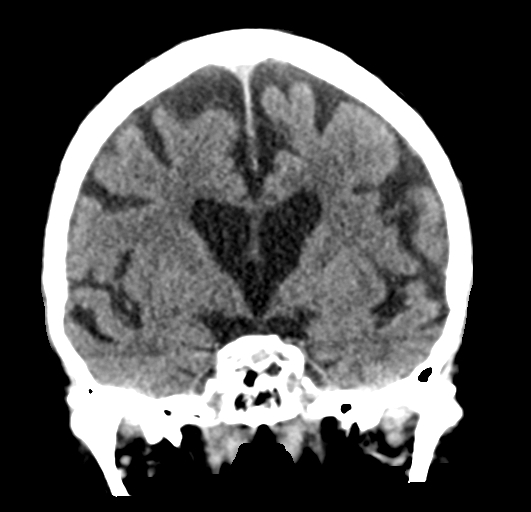
[im 37/67  brain]
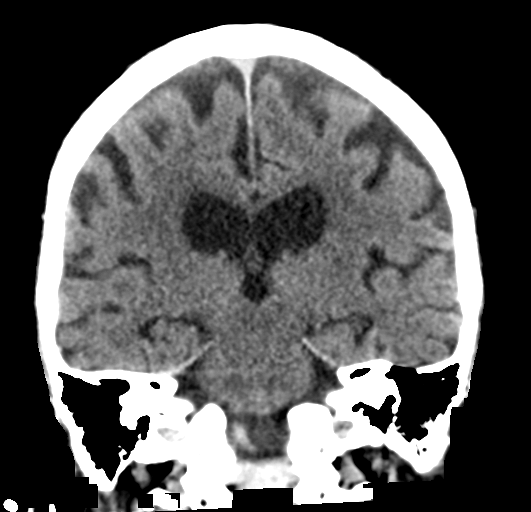

[Series 6: head without sag · sagittal · non-contrast · 0.32mm/px · 3 of 50 slices shown]
[im 17/50  brain]
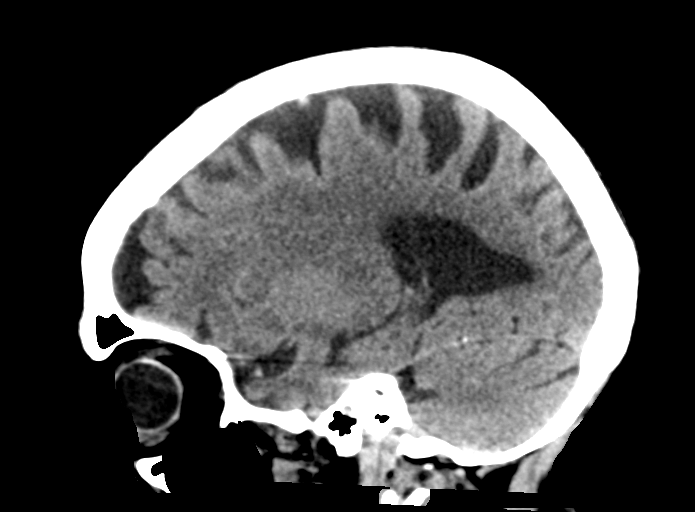
[im 25/50  brain]
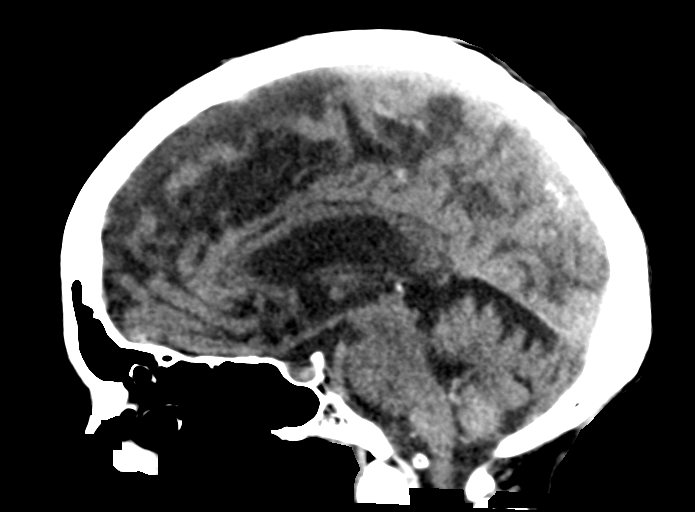
[im 33/50  brain]
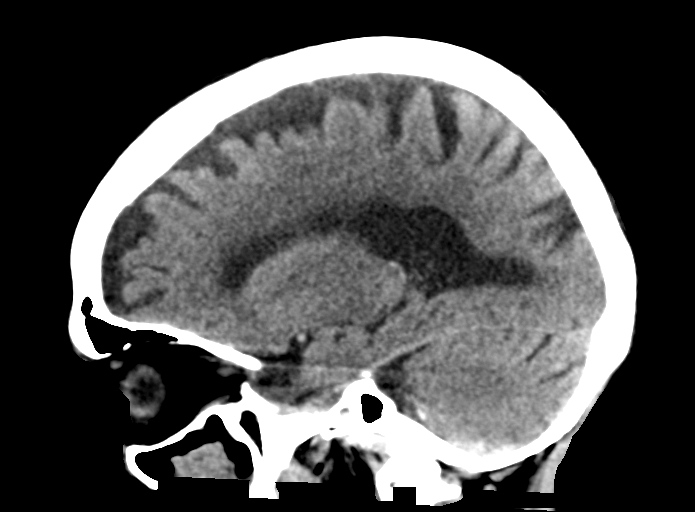

[16 of 47 positions shown; findings below may reference images not displayed]

FINDINGS: Brain: There is no acute intracranial hemorrhage, mass effect, or
edema. Gray-white differentiation is preserved. There is no
extra-axial fluid collection. Prominence of the ventricles and sulci
reflects stable parenchymal volume loss. Patchy hypoattenuation in
the supratentorial white matter is nonspecific but probably reflects
stable chronic microvascular ischemic changes.

Vascular: There is atherosclerotic calcification at the skull base.

Skull: Calvarium is unremarkable.

Sinuses/Orbits: Polypoid right maxillary sinus mucosal thickening.

Other: None.
IMPRESSION: No acute intracranial abnormality. Stable chronic/nonemergent
findings detailed above.

## 2021-12-27 IMAGING — DX DG CHEST 1V PORT
1 series · 1 of 1 positions shown · non-contrast
Comparison: November 16, 2016.

CLINICAL DATA: Altered mental status.

EXAM:
PORTABLE CHEST 1 VIEW

[chest ap]
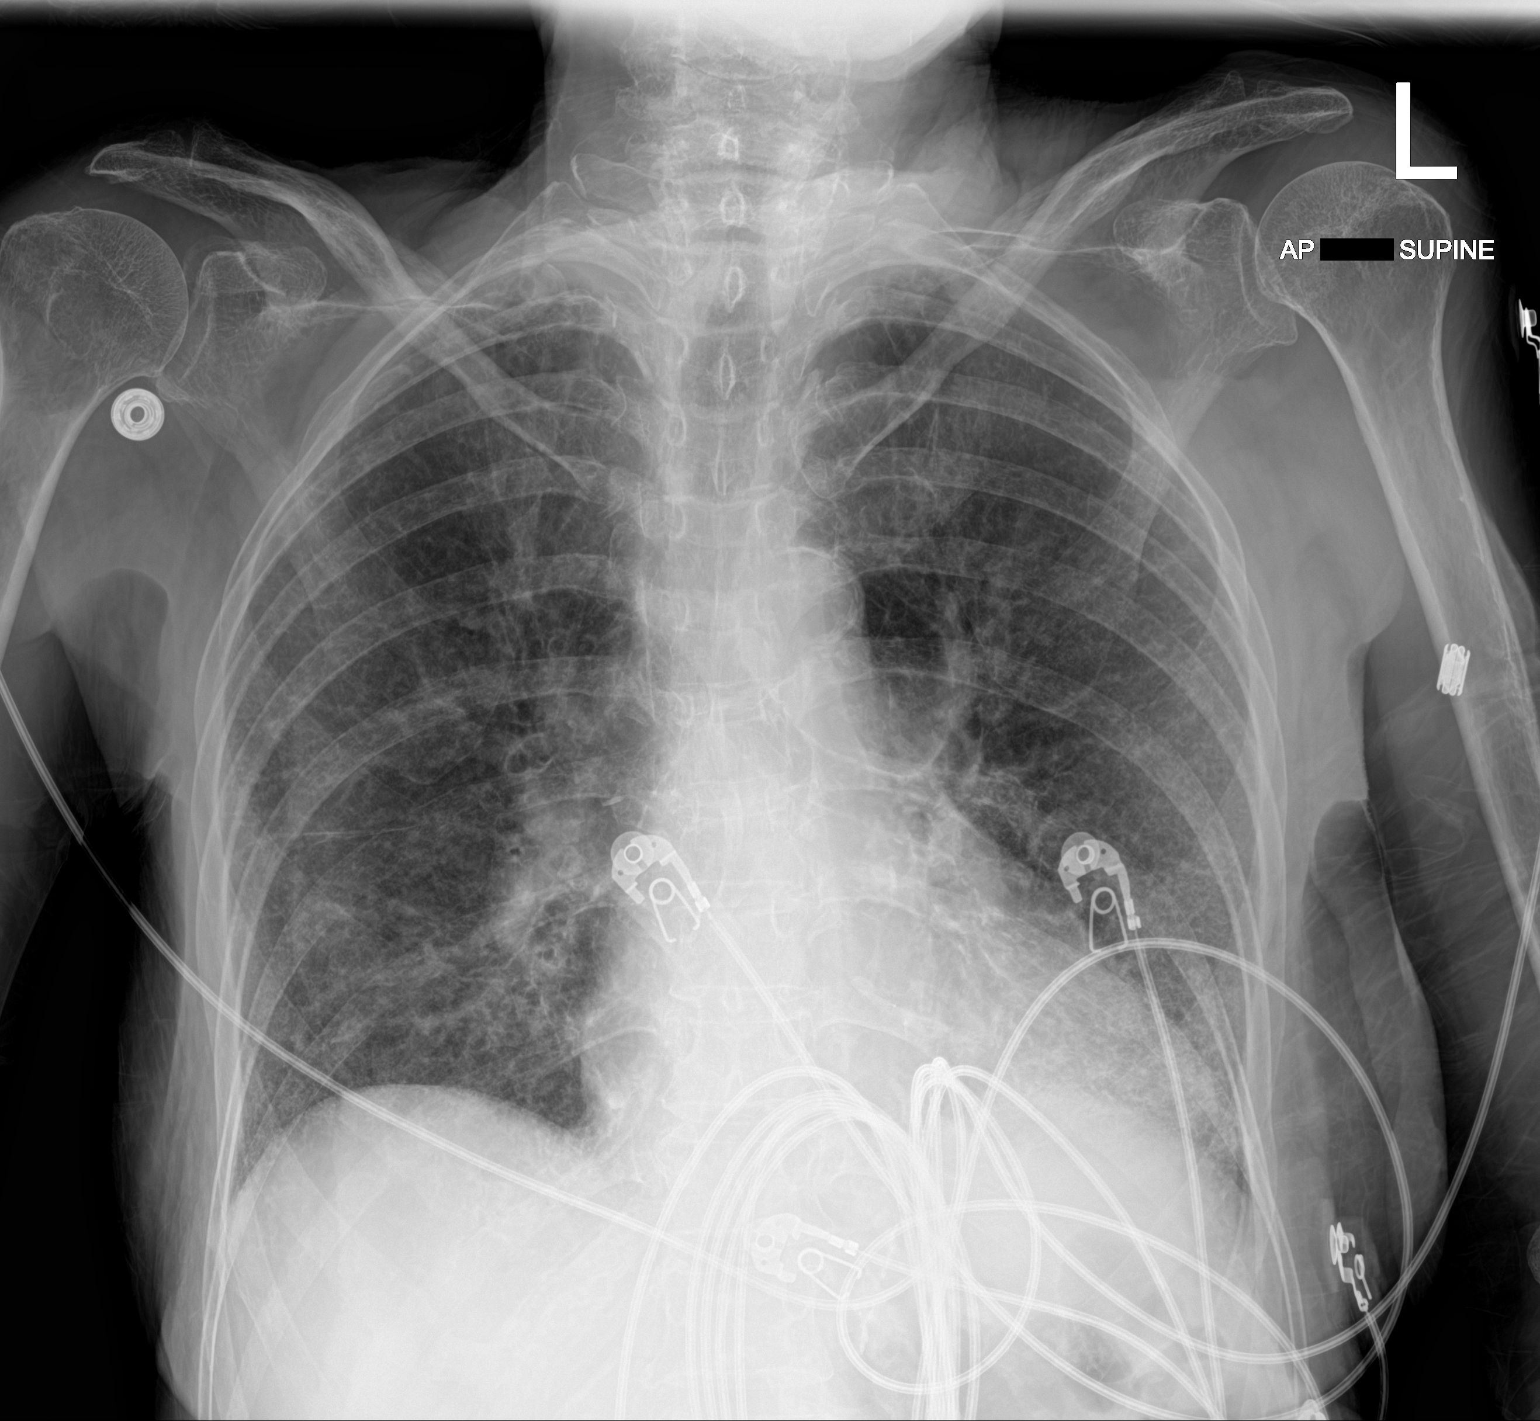

[1 of 1 positions shown; findings below may reference images not displayed]

FINDINGS: The heart size and mediastinal contours are within normal limits. No
pneumothorax or pleural effusion is noted. Stable interstitial
densities are noted throughout both lungs most consistent with
chronic interstitial lung disease or fibrosis, but acute
superimposed inflammation or edema cannot be excluded. The
visualized skeletal structures are unremarkable.
IMPRESSION: Stable interstitial densities are noted throughout both lungs most
consistent with chronic interstitial lung disease or fibrosis, but
acute superimposed inflammation or edema cannot be excluded.

Aortic Atherosclerosis ([id number]).
